# Patient Record
Sex: Female | Born: 1970 | Race: Black or African American | Hispanic: No | Marital: Single | Smoking: Never smoker
Health system: Southern US, Community
[De-identification: ages and names within clinical notes are randomized; demographics above are authoritative.]

## PROBLEM LIST (undated history)

## (undated) DIAGNOSIS — K7460 Unspecified cirrhosis of liver: Secondary | ICD-10-CM

## (undated) DIAGNOSIS — R51 Headache: Secondary | ICD-10-CM

## (undated) DIAGNOSIS — R011 Cardiac murmur, unspecified: Secondary | ICD-10-CM

## (undated) DIAGNOSIS — N186 End stage renal disease: Secondary | ICD-10-CM

## (undated) DIAGNOSIS — G43909 Migraine, unspecified, not intractable, without status migrainosus: Secondary | ICD-10-CM

## (undated) DIAGNOSIS — I272 Pulmonary hypertension, unspecified: Secondary | ICD-10-CM

## (undated) DIAGNOSIS — R188 Other ascites: Secondary | ICD-10-CM

## (undated) DIAGNOSIS — I10 Essential (primary) hypertension: Secondary | ICD-10-CM

## (undated) DIAGNOSIS — Z992 Dependence on renal dialysis: Secondary | ICD-10-CM

## (undated) DIAGNOSIS — Z9289 Personal history of other medical treatment: Secondary | ICD-10-CM

## (undated) DIAGNOSIS — Z95828 Presence of other vascular implants and grafts: Secondary | ICD-10-CM

## (undated) DIAGNOSIS — D638 Anemia in other chronic diseases classified elsewhere: Secondary | ICD-10-CM

## (undated) HISTORY — DX: Pulmonary hypertension, unspecified: I27.20

## (undated) HISTORY — DX: Unspecified cirrhosis of liver: K74.60

## (undated) HISTORY — PX: THORACENTESIS: SHX235

---

## 2010-10-19 HISTORY — PX: KIDNEY TRANSPLANT: SHX239

## 2011-10-20 HISTORY — PX: ARTERIOVENOUS GRAFT PLACEMENT: SUR1029

## 2014-04-14 ENCOUNTER — Non-Acute Institutional Stay (HOSPITAL_COMMUNITY)
Admission: EM | Admit: 2014-04-14 | Discharge: 2014-04-16 | Disposition: A | Discharge status: Home / Self Care | Payer: Medicare Other | Attending: Emergency Medicine | Admitting: Emergency Medicine

## 2014-04-14 ENCOUNTER — Encounter (HOSPITAL_COMMUNITY): Payer: Self-pay | Admitting: Emergency Medicine

## 2014-04-14 ENCOUNTER — Other Ambulatory Visit: Payer: Self-pay

## 2014-04-14 ENCOUNTER — Emergency Department (HOSPITAL_COMMUNITY): Payer: Medicare Other

## 2014-04-14 DIAGNOSIS — E8779 Other fluid overload: Secondary | ICD-10-CM | POA: Insufficient documentation

## 2014-04-14 DIAGNOSIS — Z91158 Patient's noncompliance with renal dialysis for other reason: Secondary | ICD-10-CM | POA: Insufficient documentation

## 2014-04-14 DIAGNOSIS — I120 Hypertensive chronic kidney disease with stage 5 chronic kidney disease or end stage renal disease: Secondary | ICD-10-CM | POA: Insufficient documentation

## 2014-04-14 DIAGNOSIS — Z9115 Patient's noncompliance with renal dialysis: Secondary | ICD-10-CM | POA: Insufficient documentation

## 2014-04-14 DIAGNOSIS — N186 End stage renal disease: Secondary | ICD-10-CM | POA: Insufficient documentation

## 2014-04-14 DIAGNOSIS — E875 Hyperkalemia: Secondary | ICD-10-CM | POA: Insufficient documentation

## 2014-04-14 DIAGNOSIS — Z992 Dependence on renal dialysis: Secondary | ICD-10-CM | POA: Insufficient documentation

## 2014-04-14 HISTORY — DX: Presence of other vascular implants and grafts: Z95.828

## 2014-04-14 HISTORY — DX: Essential (primary) hypertension: I10

## 2014-04-14 LAB — CBC WITH DIFFERENTIAL/PLATELET
Basophils Absolute: 0.1 10*3/uL (ref 0.0–0.1)
Basophils Relative: 1 % (ref 0–1)
Eosinophils Absolute: 0.6 10*3/uL (ref 0.0–0.7)
Eosinophils Relative: 8 % — ABNORMAL HIGH (ref 0–5)
HEMATOCRIT: 35.4 % — AB (ref 36.0–46.0)
Hemoglobin: 11 g/dL — ABNORMAL LOW (ref 12.0–15.0)
LYMPHS PCT: 12 % (ref 12–46)
Lymphs Abs: 0.9 10*3/uL (ref 0.7–4.0)
MCH: 28.8 pg (ref 26.0–34.0)
MCHC: 31.1 g/dL (ref 30.0–36.0)
MCV: 92.7 fL (ref 78.0–100.0)
MONO ABS: 0.5 10*3/uL (ref 0.1–1.0)
Monocytes Relative: 8 % (ref 3–12)
NEUTROS PCT: 71 % (ref 43–77)
Neutro Abs: 5.1 10*3/uL (ref 1.7–7.7)
Platelets: 181 10*3/uL (ref 150–400)
RBC: 3.82 MIL/uL — AB (ref 3.87–5.11)
RDW: 15.4 % (ref 11.5–15.5)
WBC: 7.2 10*3/uL (ref 4.0–10.5)

## 2014-04-14 LAB — HEPATITIS B SURFACE ANTIGEN: HEP B S AG: NEGATIVE

## 2014-04-14 LAB — BASIC METABOLIC PANEL
BUN: 92 mg/dL — AB (ref 6–23)
CO2: 19 meq/L (ref 19–32)
Calcium: 6.9 mg/dL — ABNORMAL LOW (ref 8.4–10.5)
Chloride: 97 mEq/L (ref 96–112)
Creatinine, Ser: 14.57 mg/dL — ABNORMAL HIGH (ref 0.50–1.10)
GFR calc Af Amer: 3 mL/min — ABNORMAL LOW (ref >90)
GFR calc non Af Amer: 3 mL/min — ABNORMAL LOW (ref >90)
Glucose, Bld: 105 mg/dL — ABNORMAL HIGH (ref 70–99)
Potassium: 6.3 mEq/L — ABNORMAL HIGH (ref 3.7–5.3)
Sodium: 137 mEq/L (ref 137–147)

## 2014-04-14 LAB — PHOSPHORUS: PHOSPHORUS: 9.6 mg/dL — AB (ref 2.3–4.6)

## 2014-04-14 NOTE — ED Notes (Signed)
Patient transported to X-ray 

## 2014-04-14 NOTE — Procedures (Signed)
Patient was seen on dialysis and the procedure was supervised.  BFR 400  Via thigh AVG BP is  180/100.   Patient appears to be tolerating treatment well- to be discharged after HD  GOLDSBOROUGH,KELLIE A 04/14/2014

## 2014-04-14 NOTE — Discharge Summary (Signed)
Physician Discharge Summary  Patient ID: Holly Mendoza MRN: [id number] DOB/AGE: Feb 01, 1971 43 y.o.  Admit date: 04/14/2014 Discharge date: 04/14/2014  Admission Diagnoses: hyperkalemia due to noncompliance with HD  Discharge Diagnoses:  Active Problems:   Hyperkalemia   Discharged Condition: fair  Hospital Course: Pt presented after failing to set up OP dialysis associated with a move to the area.  She is noted to be noncompliant with dialysis where she lives and that has been the issue. She is hyperkalemic and volume overloaded today.   Will plan on doing HD today and allow her to go home.  OP dialysis may be difficult but will need to work on this on Monday  Consults: None  Significant Diagnostic Studies: labs: potassium of 6.3  Treatments: dialysis: Hemodialysis  Discharge Exam: Blood pressure 190/98, pulse 96, temperature 97.5 F (36.4 C), temperature source Oral, resp. rate 12, SpO2 100.00%. General appearance: alert and no distress Resp: diminished breath sounds bibasilar Cardio: regular rate and rhythm, S1, S2 normal, no murmur, click, rub or gallop GI: soft, non-tender; bowel sounds normal; no masses,  no organomegaly Extremities: edema 2 plus thigh AVG  Disposition: to home with family     Medication List    ASK your doctor about these medications       calcium acetate 667 MG capsule  Commonly known as:  PHOSLO  Take 1,334 mg by mouth 3 (three) times daily with meals.     cloNIDine 0.3 MG tablet  Commonly known as:  CATAPRES  Take 0.3 mg by mouth 2 (two) times daily.         SignedAnnie Mendoza A 04/14/2014, 3:33 PM

## 2014-04-14 NOTE — ED Provider Notes (Signed)
TIME SEEN: 12:48 PM  CHIEF COMPLAINT: "I'm here for dialysis"  HPI: Patient is a 43 y.o. F with history of end-stage renal disease he has a AV fistula in her left upper extremity who receives hemodialysis Tuesday, Thursday and Saturday, hypertension presents to the emergency department requesting dialysis. Patient states she was last dialyzed on Tuesday, 4 days ago. She states that she has had some increasing swelling of her lower extremities but no chest pain, shortness of breath. She does occasionally have to have paracentesis due to abdominal swelling and had this last on Thursday, 2 days ago. She states [place] here from [place] and has not established care with a nephrologist and does not have a dialysis Center yet. She states this was supposed to be set up for her prior to coming to [place] but was not. She states she is here because she is concerned that she may need dialysis as an inpatient due to her increased swelling. She denies fevers, cough, vomiting or diarrhea. No numbness or focal weakness.  ROS: See HPI Constitutional: no fever  Eyes: no drainage  ENT: no runny nose   Cardiovascular:  no chest pain  Resp: no SOB  GI: no vomiting GU: no dysuria Integumentary: no rash  Allergy: no hives  Musculoskeletal:  leg swelling  Neurological: no slurred speech ROS otherwise negative  PAST MEDICAL HISTORY/PAST SURGICAL HISTORY:  Past Medical History  Diagnosis Date  . Renal disease   . 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 Hypertension     MEDICATIONS:  Prior to Admission medications   Medication Sig Start Date End Date Taking? Authorizing Provider  calcium acetate (PHOSLO) 667 MG capsule Take 1,334 mg by mouth 3 (three) times daily with meals.   Yes Historical Provider, MD  cloNIDine (CATAPRES) 0.3 MG tablet Take 0.3 mg by mouth 2 (two) times daily.   Yes Historical Provider, MD    ALLERGIES:  Allergies  Allergen  Reactions  . Contrast Media [Iodinated Diagnostic Agents] Anaphylaxis  . Vancomycin Anaphylaxis and Hives    SOCIAL HISTORY:  History  Substance Use Topics  . Smoking status: Never Smoker   . Smokeless tobacco: Not on file  . Alcohol Use: No    FAMILY HISTORY: No family history on file.  EXAM: BP 190/98  Pulse 96  Temp(Src) 97.5 F (36.4 C) (Oral)  Resp 12  SpO2 100% CONSTITUTIONAL: Alert and oriented and responds appropriately to questions. Well-appearing; well-nourished, pleasant, no distress HEAD: Normocephalic EYES: Conjunctivae clear, PERRL ENT: normal nose; no rhinorrhea; moist mucous membranes; pharynx without lesions noted NECK: Supple, no meningismus, no LAD  CARD: RRR; S1 and S2 appreciated; no murmurs, no clicks, no rubs, no gallops RESP: Normal chest excursion without splinting or tachypnea; breath sounds clear and equal bilaterally; no wheezes, no rhonchi, no rales, no hypoxia or respiratory distress, no increased work of breathing ABD/GI: Normal bowel sounds; non-distended; soft, non-tender, no rebound, no guarding BACK:  The back appears normal and is non-tender to palpation, there is no CVA tenderness EXT: Normal ROM in all joints; non-tender to palpation; pitting edema to the level of her bilateral knees with no erythema or warmth or induration; normal capillary refill; no cyanosis; fistula in left thigh with thrill and bruit, normal femoral pulses bilaterally, 2+ DP pulses bilaterally, no urothelial or warmth or induration or fluctuance or drainage over her fistula site SKIN: Normal color for age and race; warm NEURO: Moves all  extremities equally sensation to light touch intact diffusely, cranial nerves II through XII intact PSYCH: The patient's mood and manner are appropriate. Grooming and personal hygiene are appropriate.  MEDICAL DECISION MAKING: Patient here requesting dialysis. She states she just moved here from . Last dialyzed 4 days ago. She  missed her dialysis on Thursday and would be due for dialysis again today. She is having some lower extremity swelling she states is slightly worse than normal but no respiratory distress, shortness of breath, chest pain. We'll obtain labs, chest x-ray. She is mildly hypertensive. We'll discuss with nephrology for appropriate followup versus admission.  ED PROGRESS: Patient has an elevated potassium level of 6.3. 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 She is still asymptomatic. Discussed with Dr. Katherina Mires with nephrology. Renal will see the patient in the ED and sent her up for dialysis today. They do not feel she presently needs to be admitted to the hospital at this time as they feel because she is asymptomatic they can dialyze her and then discharge her with outpatient followup. Will keep patient on a monitor given her hyperkalemia.    EKG Interpretation  Date/Time:  Saturday April 14 2014 13:05:19 EDT Ventricular Rate:  82 PR Interval:  142 QRS Duration: 152 QT Interval:  443 QTC Calculation: 517 R Axis:   115 Text Interpretation:  Normal sinus rhythm Right bundle branch block Left anterior fasicular block No previous ECGs available Confirmed by RAY MD, DANIELLE ([id number]) on 04/14/2014 1:09:42 PM        Layla Maw Ward, DO 04/14/14 1452

## 2014-04-14 NOTE — ED Notes (Signed)
Consent for Request for medical records and dialysis orders signed by Patient from [facility] in [place].

## 2014-04-14 NOTE — ED Notes (Signed)
Pt recently moved from [place], [place] and needs dialysis. States she normally gets paperwork to have it set up. Was not able to have anything set up for her prior to her living. Is normally a Tues, Thurs, Saturday dialysis. Denies any pain.

## 2014-04-14 NOTE — H&P (Signed)
Short History and Physical Form [facility]  04/14/2014,3:19 PM  LOS: 0 days   HPI:   Holly Mendoza is an 43 y.o. female with PMhx significant for ESRD- on HD since 1996, malignant HTN and what appears to be cardiomyopathy.  She previously was getting HD a the [facility] in [place] but was noted that she would only run in dialysis for 2 hours.  She says she can only run for 2 hours due to the "naturalyte" dialysate that she does not tolerate dialysis well- she has chest burning and cannot tolerate.  She is moving to this area to be with family.  Attempts were made to secure her a place but her behavior of signing off early q treatment and multiple hosps was a red flag so she was not accepted.  She moved here on Wednesday.  Last HD was Tuesday.  She gets weekly paracentesis of 6-8 liters per her but doesn't eat or drink anything.  She presents to ER here, she is volume overloaded and hypertensive and hyperkalemic.    She has no OP unit for now  Past Medical History  Diagnosis Date  . Renal disease   . 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 Hypertension    Allergies  Allergen Reactions  . Contrast Media [Iodinated Diagnostic Agents] Anaphylaxis  . Vancomycin Anaphylaxis and Hives   Prior to Admission medications   Medication Sig Start Date End Date Taking? Authorizing Provider  calcium acetate (PHOSLO) 667 MG capsule Take 1,334 mg by mouth 3 (three) times daily with meals.   Yes Historical Provider, MD  cloNIDine (CATAPRES) 0.3 MG tablet Take 0.3 mg by mouth 2 (two) times daily.   Yes Historical Provider, MD   Results for orders placed during the hospital encounter of 04/14/14 (from the past 48 hour(s))  CBC WITH DIFFERENTIAL     Status: Abnormal   Collection Time    04/14/14  1:00 PM      Result Value Ref Range   WBC 7.2  4.0 - 10.5 K/uL   RBC 3.82 (*) 3.87 - 5.11 MIL/uL   Hemoglobin 11.0 (*) 12.0 - 15.0 g/dL   HCT 35.4  (*) 36.0 - 46.0 %   MCV 92.7  78.0 - 100.0 fL   MCH 28.8  26.0 - 34.0 pg   MCHC 31.1  30.0 - 36.0 g/dL   RDW 15.4  11.5 - 15.5 %   Platelets 181  150 - 400 K/uL   Neutrophils Relative % 71  43 - 77 %   Neutro Abs 5.1  1.7 - 7.7 K/uL   Lymphocytes Relative 12  12 - 46 %   Lymphs Abs 0.9  0.7 - 4.0 K/uL   Monocytes Relative 8  3 - 12 %   Monocytes Absolute 0.5  0.1 - 1.0 K/uL   Eosinophils Relative 8 (*) 0 - 5 %   Eosinophils Absolute 0.6  0.0 - 0.7 K/uL   Basophils Relative 1  0 - 1 %   Basophils Absolute 0.1  0.0 - 0.1 K/uL  BASIC METABOLIC PANEL     Status: Abnormal   Collection Time    04/14/14  1:00 PM      Result Value Ref Range   Sodium 137  137 - 147 mEq/L   Potassium 6.3 (*) 3.7 - 5.3 mEq/L   Chloride 97  96 - 112 mEq/L   CO2 19  19 - 32 mEq/L  Glucose, Bld 105 (*) 70 - 99 mg/dL   BUN 92 (*) 6 - 23 mg/dL   Creatinine, Ser 14.57 (*) 0.50 - 1.10 mg/dL   Calcium 6.9 (*) 8.4 - 10.5 mg/dL   GFR calc non Af Amer 3 (*) >90 mL/min   GFR calc Af Amer 3 (*) >90 mL/min   Comment: (NOTE)     The eGFR has been calculated using the CKD EPI equation.     This calculation has not been validated in all clinical situations.     eGFR's persistently <90 mL/min signify possible Chronic Kidney     Disease.  PHOSPHORUS     Status: Abnormal   Collection Time    04/14/14  1:00 PM      Result Value Ref Range   Phosphorus 9.6 (*) 2.3 - 4.6 mg/dL   Dg Chest 2 View  04/14/2014   CLINICAL DATA:  Edema  EXAM: CHEST  2 VIEW  COMPARISON:  None.  FINDINGS: Moderate cardiomegaly. Normal vascularity. Small left pleural effusion with basilar atelectasis. Hazy opacity at the right base with elevation of the right hemidiaphragm likely reflects a combination of pleural effusion and airspace disease. Linear atelectasis at the base of the right upper lobe.  IMPRESSION: Cardiomegaly without convincing evidence of pulmonary edema.  Small pleural effusions.  Right basilar hazy airspace disease is suspected.    Electronically Signed   By: Maryclare Bean M.D.   On: 04/14/2014 13:42    Physical Exam: Filed Vitals:   04/14/14 1231  BP:   Pulse: 96  Temp:   Resp: 12   General: soft spoken BF, many excuses about why she is not able to participate appropriately with her dialysis and why she moved without a spot Heart: RRR Lungs: decreased BS at bases Abdomen: obese- does not seem like ascites present Extremities: pitting edema bilaterally Access: left thigh AVG  Assessment/Plan: 43 year old BF long standing dialysis patient who is noncompliant and as a result has a cardiomyopathy and requires supplemental paracentesis for volume 1 Hyperkalemia- HD here in hospital today followed by discharge.   2 ESRD: I have written for 4 hours, we will see how she tolerates here- she is uremic today so is in need of a good treatment 3. Volume- is overloaded- goal of 4-5 liters 4. Dispo- pt will be discharged after HD- unfortunately she will be a tough placement but now that she is here, we will just need to figure out the unit closest to her - it seems it would be [facility]  04/14/2014, 3:19 PM

## 2014-04-14 NOTE — ED Notes (Signed)
MD at bedside. 

## 2014-04-14 NOTE — Progress Notes (Signed)
Pt tolerated tx well goal met, pt d/c'd home with family via wheelchair. No c/o voiced pt stable at time of discharge

## 2014-04-18 ENCOUNTER — Emergency Department (HOSPITAL_COMMUNITY)
Admission: EM | Admit: 2014-04-18 | Discharge: 2014-04-19 | Disposition: A | Discharge status: Home / Self Care | Payer: Medicare Other | Attending: Emergency Medicine | Admitting: Emergency Medicine

## 2014-04-18 ENCOUNTER — Encounter (HOSPITAL_COMMUNITY): Payer: Self-pay | Admitting: Emergency Medicine

## 2014-04-18 ENCOUNTER — Emergency Department (HOSPITAL_COMMUNITY): Payer: Medicare Other

## 2014-04-18 DIAGNOSIS — M7989 Other specified soft tissue disorders: Secondary | ICD-10-CM | POA: Insufficient documentation

## 2014-04-18 DIAGNOSIS — E875 Hyperkalemia: Secondary | ICD-10-CM | POA: Insufficient documentation

## 2014-04-18 DIAGNOSIS — N186 End stage renal disease: Secondary | ICD-10-CM | POA: Insufficient documentation

## 2014-04-18 DIAGNOSIS — I10 Essential (primary) hypertension: Secondary | ICD-10-CM | POA: Insufficient documentation

## 2014-04-18 LAB — COMPREHENSIVE METABOLIC PANEL
ALK PHOS: 163 U/L — AB (ref 39–117)
ALT: 18 U/L (ref 0–35)
ANION GAP: 21 — AB (ref 5–15)
AST: 20 U/L (ref 0–37)
Albumin: 2.2 g/dL — ABNORMAL LOW (ref 3.5–5.2)
BILIRUBIN TOTAL: 0.2 mg/dL — AB (ref 0.3–1.2)
BUN: 78 mg/dL — ABNORMAL HIGH (ref 6–23)
CHLORIDE: 95 meq/L — AB (ref 96–112)
CO2: 22 mEq/L (ref 19–32)
Calcium: 6.9 mg/dL — ABNORMAL LOW (ref 8.4–10.5)
Creatinine, Ser: 13.68 mg/dL — ABNORMAL HIGH (ref 0.50–1.10)
GFR calc non Af Amer: 3 mL/min — ABNORMAL LOW (ref >90)
GFR, EST AFRICAN AMERICAN: 3 mL/min — AB (ref >90)
GLUCOSE: 83 mg/dL (ref 70–99)
POTASSIUM: 6.4 meq/L — AB (ref 3.7–5.3)
Sodium: 138 mEq/L (ref 137–147)
Total Protein: 6.3 g/dL (ref 6.0–8.3)

## 2014-04-18 LAB — CBC WITH DIFFERENTIAL/PLATELET
Basophils Absolute: 0 10*3/uL (ref 0.0–0.1)
Basophils Relative: 0 % (ref 0–1)
Eosinophils Absolute: 0.4 10*3/uL (ref 0.0–0.7)
Eosinophils Relative: 6 % — ABNORMAL HIGH (ref 0–5)
HCT: 33.3 % — ABNORMAL LOW (ref 36.0–46.0)
HEMOGLOBIN: 10.7 g/dL — AB (ref 12.0–15.0)
LYMPHS ABS: 1.2 10*3/uL (ref 0.7–4.0)
LYMPHS PCT: 17 % (ref 12–46)
MCH: 28.8 pg (ref 26.0–34.0)
MCHC: 32.1 g/dL (ref 30.0–36.0)
MCV: 89.5 fL (ref 78.0–100.0)
MONOS PCT: 8 % (ref 3–12)
Monocytes Absolute: 0.6 10*3/uL (ref 0.1–1.0)
NEUTROS ABS: 5.2 10*3/uL (ref 1.7–7.7)
NEUTROS PCT: 69 % (ref 43–77)
Platelets: 197 10*3/uL (ref 150–400)
RBC: 3.72 MIL/uL — AB (ref 3.87–5.11)
RDW: 14.6 % (ref 11.5–15.5)
WBC: 7.4 10*3/uL (ref 4.0–10.5)

## 2014-04-18 NOTE — Progress Notes (Signed)
04/18/14 1945 W. Stann Mainland RN BSN NCM [phone] Pt presented to ED for HD as per patient. ED case manager review chart and met with patient in HD, patient provided permission to discuss care, verified information.  Patient is a ESRD on HD, and reports recently relocating to the area from [place]. She was seen in the ER 4 days ago with a similar presentation and had dialysis, was then discharged. She was not established with a accepting transfer HD center in [place]. Pt states, that she was not aware that she needed to be accepted by a center on [place].  Provided patient with a list of HD centers in the [facility] area, and the number for [facility] [phone] for assistance. Pt verbalized understanding and appreciation for the assistance.  No further ED CM needs identified     ED CM  Consulted by Dr. Betsey Holiday concerning HD needs, went to [facility]  to meet with patient patient en route to HD. Will f/u with patient.

## 2014-04-18 NOTE — ED Notes (Signed)
To [facility] ED for routene HD.  States that she does not have a center.

## 2014-04-18 NOTE — Procedures (Signed)
I have seen and examined this patient and agree with the plan of care . Patient seen on HD with no acute issues  Treatment for vol XS and hyperkalemia  Holly Mendoza 04/18/2014, 6:35 PM

## 2014-04-18 NOTE — ED Notes (Signed)
Pt here for routine dialysis; pt sts here Sunday for same and is relocating here and does not have a center due to non compliance

## 2014-04-18 NOTE — Progress Notes (Signed)
Pt signed off tx ama sheet signed per patient, D/C'd home with family

## 2014-04-18 NOTE — Consult Note (Signed)
Referring Provider: No ref. provider found Primary Care Physician:  No PCP Per Patient Primary Nephrologist:  none  Reason for Consultation:  Holly Mendoza is an 43 y.o. female with PMhx significant for ESRD- on HD since 1996, malignant HTN and what appears to be cardiomyopathy. She previously was getting HD a the [facility] in [place] but was noted that she would only run in dialysis for 2 hours. She says she can only run for 2 hours due to the "naturalyte" dialysate that she does not tolerate dialysis well- she has chest burning and cannot tolerate. She is moving to this area to be with family. Attempts were made to secure her a place but her behavior of signing off early q treatment and multiple hosps was a red flag so she was not accepted. She moved here on Wednesday. Last HD was Tuesday. She gets weekly paracentesis of 6-8 liters per her but doesn't eat or drink anything.   HPI:  History of failed transplant. She is hyperkalemic and volume overload. The history of naturalyte allergy is a curiosity and do not believe that there are alternatives. The redness on her legs appear more chronic stasis changes.  Past Medical History  Diagnosis Date  . Renal disease   . 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 Hypertension     Past Surgical History  Procedure Laterality Date  . Arteriovenous graft placement Left     thigh  . 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 Authorizing Provider  calcium acetate (PHOSLO) 667 MG capsule Take 1,334 mg by mouth 3 (three) times daily with meals.   Yes Historical Provider, MD  cloNIDine (CATAPRES) 0.3 MG tablet Take 0.3 mg by mouth 2 (two) times daily.   Yes Historical Provider, MD    No current facility-administered medications for this encounter.   Current Outpatient Prescriptions   Medication Sig Dispense Refill  . calcium acetate (PHOSLO) 667 MG capsule Take 1,334 mg by mouth 3 (three) times daily with meals.      . cloNIDine (CATAPRES) 0.3 MG tablet Take 0.3 mg by mouth 2 (two) times daily.        Allergies as of 04/18/2014 - Review Complete 04/18/2014  Allergen Reaction Noted  . Contrast media [iodinated diagnostic agents] Anaphylaxis 04/14/2014  . Vancomycin Anaphylaxis and Hives 04/14/2014    History reviewed. No pertinent family history.  History   Social History  . Marital Status: Single    Spouse Name: N/A    Number of Children: N/A  . Years of Education: N/A   Occupational History  . Not on file.   Social History Main Topics  . Smoking status: Never Smoker   . Smokeless tobacco: Not on file  . Alcohol Use: No  . Drug Use: No  . Sexual Activity: Not on file   Other Topics Concern  . Not on file   Social History Narrative  . No narrative on file    Review of Systems: Gen: Denies any fever, chills, sweats, anorexia, fatigue, weakness, malaise, weight loss, and sleep disorder HEENT: No visual complaints, No history of Retinopathy. Normal external appearance No Epistaxis or Sore throat. No sinusitis.   CV: Denies chest pain, angina, palpitations, syncope, orthopnea, PND, peripheral edema, and claudication. Resp: no respiratory distress  GI: Denies vomiting  blood, jaundice, and fecal incontinence.   Denies dysphagia or odynophagia. GU : Denies urinary burning, blood in urine, urinary frequency, urinary hesitancy, nocturnal urination, and urinary incontinence.  No renal calculi. MS: Denies joint pain, limitation of movement, and swelling, stiffness, low back pain, extremity pain. Denies muscle weakness, cramps, atrophy.  No use of non steroidal antiinflammatory drugs. Derm: redness in legs  Psych: Denies depression, anxiety, memory loss, suicidal ideation, hallucinations, paranoia, and confusion. Heme: Denies bruising, bleeding, and enlarged  lymph nodes. Neuro: No headache.  No diplopia. No dysarthria.  No dysphasia.  No history of CVA.  No Seizures. No paresthesias.  No weakness. Endocrine No DM.  No Thyroid disease.  No Adrenal disease.  Physical Exam: Vital signs in last 24 hours: Temp:  [98 F (36.7 C)-98.1 F (36.7 C)] 98.1 F (36.7 C) (07/01 1732) Pulse Rate:  [96-112] 104 (07/01 1754) Resp:  [15-18] 18 (07/01 1754) BP: (183-197)/(100-116) 197/116 mmHg (07/01 1754) SpO2:  [97 %-98 %] 97 % (07/01 1732) Weight:  [90.6 kg (199 lb 11.8 oz)] 90.6 kg (199 lb 11.8 oz) (07/01 1732)   General: soft spoken BF, many excuses about why she is not able to participate appropriately with her dialysis and why she moved without a spot  Heart: RRR  Lungs: decreased BS at bases  Abdomen: obese- does not seem like ascites present  Extremities: pitting edema bilaterally  Access: left thigh AVG  Psych:  Alert and cooperative. Normal mood and affect.  Intake/Output from previous day:   Intake/Output this shift:    Lab Results:  Recent Labs  04/18/14 1415  WBC 7.4  HGB 10.7*  HCT 33.3*  PLT 197   BMET  Recent Labs  04/18/14 1415  NA 138  K 6.4*  CL 95*  CO2 22  GLUCOSE 83  BUN 78*  CREATININE 13.68*  CALCIUM 6.9*   LFT  Recent Labs  04/18/14 1415  PROT 6.3  ALBUMIN 2.2*  AST 20  ALT 18  ALKPHOS 163*  BILITOT 0.2*   PT/INR No results found for this basename: LABPROT, INR,  in the last 72 hours Hepatitis Panel No results found for this basename: HEPBSAG, HCVAB, HEPAIGM, HEPBIGM,  in the last 72 hours  Studies/Results: Dg Chest Port 1 View  04/18/2014   CLINICAL DATA:  Shortness of breath.  EXAM: PORTABLE CHEST - 1 VIEW  COMPARISON:  04/14/2014  FINDINGS: Cardiac silhouette is enlarged. No mediastinal or hilar masses. Reticular opacity lies in the right upper lobe inferiorly and laterally adjacent to the minor fissure, stable. There is hazy right lung base opacity accentuated by positioning. This may  reflect a small effusion with atelectasis. Is similar to the prior exam. There is vascular prominence without overt pulmonary edema. No lung consolidation is seen. There is no pneumothorax. Bony thorax is demineralized but grossly intact.  IMPRESSION: No convincing acute cardiopulmonary disease. There is cardiomegaly and vascular prominent, but no overt edema. Probable small right effusion with associated atelectasis.   Electronically Signed   By: Amie Portlandavid  Ormond M.D.   On: 04/18/2014 17:11    Assessment/Plan: 43 year old BF long standing dialysis patient who is noncompliant and as a result has a cardiomyopathy and requires supplemental paracentesis for volume  1 Hyperkalemia- HD here in hospital today followed by discharge.  2 ESRD: 4hrs therapy 3. Volume- is overloaded- goal of 4-5 liters  4. Dispo- pt will be discharged after HD   Social worker to evaluate. Needs out-patient slot. Have patient return  On schedule     LOS: 0 Matalyn Nawaz W @TODAY @6 :26 PM

## 2014-04-18 NOTE — ED Provider Notes (Signed)
CSN: [id number]     Arrival date & time 04/18/14  1401 History   First MD Initiated Contact with Patient 04/18/14 1644     Chief Complaint  Patient presents with  . Vascular Access Problem     (Consider location/radiation/quality/duration/timing/severity/associated sxs/prior Treatment) HPI Comments: Patient presents to the ER stating that she needs dialysis. Patient has recently moved to the area from [place]. She was seen in the ER 4 days ago with a similar presentation and had dialysis, was then discharged. She has not heard about any followup for ongoing dialysis sessions, comes to the ER because she thinks she will need dialysis. She is feeling slightly short of breath. She has had some slight swelling of her legs. There is no chest pain. Patient hypertensive arrival, no headache or blurred vision.   Past Medical History  Diagnosis Date  . Renal disease   . 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 Hypertension    Past Surgical History  Procedure Laterality Date  . Arteriovenous graft placement Left     thigh  . 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 No pertinent family history. History  Substance Use Topics  . Smoking status: Never Smoker   . Smokeless tobacco: Not on file  . Alcohol Use: No   OB History   Grav Para Term Preterm Abortions TAB SAB Ect Mult Living                 Review of Systems  Respiratory: Positive for shortness of breath.   Cardiovascular: Positive for leg swelling. Negative for chest pain.  All other systems reviewed and are negative.     Allergies  Contrast media and Vancomycin  Home Medications   Prior to Admission medications   Medication Sig Start Date End Date Taking? Authorizing Provider  calcium acetate (PHOSLO) 667 MG capsule Take 1,334 mg by mouth 3 (three) times daily with meals.   Yes Historical Provider, MD  cloNIDine  (CATAPRES) 0.3 MG tablet Take 0.3 mg by mouth 2 (two) times daily.   Yes Historical Provider, MD   BP 191/100  Pulse 100  Temp(Src) 98 F (36.7 C) (Oral)  Resp 15  SpO2 98% Physical Exam  Constitutional: She is oriented to person, place, and time. She appears well-developed and well-nourished. No distress.  HENT:  Head: Normocephalic and atraumatic.  Right Ear: Hearing normal.  Left Ear: Hearing normal.  Nose: Nose normal.  Mouth/Throat: Oropharynx is clear and moist and mucous membranes are normal.  Eyes: Conjunctivae and EOM are normal. Pupils are equal, round, and reactive to light.  Neck: Normal range of motion. Neck supple.  Cardiovascular: Regular rhythm, S1 normal and S2 normal.  Exam reveals no gallop and no friction rub.   No murmur heard. Pulmonary/Chest: Effort normal and breath sounds normal. No respiratory distress. She exhibits no tenderness.  Abdominal: Soft. Normal appearance and bowel sounds are normal. There is no hepatosplenomegaly. There is no tenderness. There is no rebound, no guarding, no tenderness at McBurney's point and negative Murphy's sign. No hernia.  Musculoskeletal: Normal range of motion.  Neurological: She is alert and oriented to person, place, and time. She has normal strength. No cranial nerve deficit or sensory deficit. Coordination normal. GCS eye subscore is 4. GCS verbal subscore is 5. GCS motor subscore is 6.  Skin: Skin is warm, dry and intact. No  rash noted. No cyanosis.  Psychiatric: She has a normal mood and affect. Her speech is normal and behavior is normal. Thought content normal.    ED Course  Procedures (including critical care time) Labs Review Labs Reviewed  CBC WITH DIFFERENTIAL - Abnormal; Notable for the following:    RBC 3.72 (*)    Hemoglobin 10.7 (*)    HCT 33.3 (*)    Eosinophils Relative 6 (*)    All other components within normal limits  COMPREHENSIVE METABOLIC PANEL - Abnormal; Notable for the following:     Potassium 6.4 (*)    Chloride 95 (*)    BUN 78 (*)    Creatinine, Ser 13.68 (*)    Calcium 6.9 (*)    Albumin 2.2 (*)    Alkaline Phosphatase 163 (*)    Total Bilirubin 0.2 (*)    GFR calc non Af Amer 3 (*)    GFR calc Af Amer 3 (*)    Anion gap 21 (*)    All other components within normal limits    Imaging Review No results found.   EKG Interpretation None      MDM   Final diagnoses:  None   hyperkalemia End-stage renal disease  Patient presents to the ER with shortnes of breath, no dialysis for 4 days. She has hyperkalemia. She likely has some mild volume overload. Her range is made for her to get emergent dialysis. Case manager consult to help with long-term followup for dialysis.    Gilda Creasehristopher J. Trusten Hume, MD 04/19/14 [phone]

## 2014-04-18 NOTE — ED Notes (Signed)
Transported to HD by RN

## 2014-04-20 ENCOUNTER — Encounter (HOSPITAL_COMMUNITY): Payer: Self-pay | Admitting: Emergency Medicine

## 2014-04-20 ENCOUNTER — Emergency Department (HOSPITAL_COMMUNITY)
Admission: EM | Admit: 2014-04-20 | Discharge: 2014-04-20 | Disposition: A | Discharge status: Home / Self Care | Payer: Medicare Other | Attending: Emergency Medicine | Admitting: Emergency Medicine

## 2014-04-20 DIAGNOSIS — Y831 Surgical operation with implant of artificial internal device as the cause of abnormal reaction of the patient, or of later complication, without mention of misadventure at the time of the procedure: Secondary | ICD-10-CM | POA: Insufficient documentation

## 2014-04-20 DIAGNOSIS — Z79899 Other long term (current) drug therapy: Secondary | ICD-10-CM | POA: Insufficient documentation

## 2014-04-20 DIAGNOSIS — K429 Umbilical hernia without obstruction or gangrene: Secondary | ICD-10-CM | POA: Insufficient documentation

## 2014-04-20 DIAGNOSIS — N186 End stage renal disease: Secondary | ICD-10-CM

## 2014-04-20 DIAGNOSIS — Z940 Kidney transplant status: Secondary | ICD-10-CM | POA: Insufficient documentation

## 2014-04-20 DIAGNOSIS — Z992 Dependence on renal dialysis: Secondary | ICD-10-CM | POA: Insufficient documentation

## 2014-04-20 DIAGNOSIS — R188 Other ascites: Secondary | ICD-10-CM | POA: Insufficient documentation

## 2014-04-20 DIAGNOSIS — I120 Hypertensive chronic kidney disease with stage 5 chronic kidney disease or end stage renal disease: Secondary | ICD-10-CM | POA: Insufficient documentation

## 2014-04-20 DIAGNOSIS — E875 Hyperkalemia: Secondary | ICD-10-CM | POA: Insufficient documentation

## 2014-04-20 DIAGNOSIS — Z87898 Personal history of other specified conditions: Secondary | ICD-10-CM

## 2014-04-20 HISTORY — DX: Dependence on renal dialysis: Z99.2

## 2014-04-20 HISTORY — DX: End stage renal disease: N18.6

## 2014-04-20 LAB — CBC WITH DIFFERENTIAL/PLATELET
Basophils Absolute: 0 10*3/uL (ref 0.0–0.1)
Basophils Relative: 0 % (ref 0–1)
Eosinophils Absolute: 0.5 10*3/uL (ref 0.0–0.7)
Eosinophils Relative: 7 % — ABNORMAL HIGH (ref 0–5)
HCT: 32.8 % — ABNORMAL LOW (ref 36.0–46.0)
Hemoglobin: 10 g/dL — ABNORMAL LOW (ref 12.0–15.0)
Lymphocytes Relative: 14 % (ref 12–46)
Lymphs Abs: 1 10*3/uL (ref 0.7–4.0)
MCH: 28 pg (ref 26.0–34.0)
MCHC: 30.5 g/dL (ref 30.0–36.0)
MCV: 91.9 fL (ref 78.0–100.0)
Monocytes Absolute: 0.5 10*3/uL (ref 0.1–1.0)
Monocytes Relative: 8 % (ref 3–12)
Neutro Abs: 5 10*3/uL (ref 1.7–7.7)
Neutrophils Relative %: 71 % (ref 43–77)
Platelets: 169 10*3/uL (ref 150–400)
RBC: 3.57 MIL/uL — ABNORMAL LOW (ref 3.87–5.11)
RDW: 14.5 % (ref 11.5–15.5)
WBC: 7 10*3/uL (ref 4.0–10.5)

## 2014-04-20 LAB — BASIC METABOLIC PANEL
Anion gap: 17 — ABNORMAL HIGH (ref 5–15)
BUN: 62 mg/dL — ABNORMAL HIGH (ref 6–23)
CO2: 24 mEq/L (ref 19–32)
Calcium: 7 mg/dL — ABNORMAL LOW (ref 8.4–10.5)
Chloride: 101 mEq/L (ref 96–112)
Creatinine, Ser: 11.47 mg/dL — ABNORMAL HIGH (ref 0.50–1.10)
GFR calc Af Amer: 4 mL/min — ABNORMAL LOW (ref >90)
GFR calc non Af Amer: 4 mL/min — ABNORMAL LOW (ref >90)
Glucose, Bld: 83 mg/dL (ref 70–99)
Potassium: 5.6 mEq/L — ABNORMAL HIGH (ref 3.7–5.3)
Sodium: 142 mEq/L (ref 137–147)

## 2014-04-20 LAB — MAGNESIUM: Magnesium: 1.9 mg/dL (ref 1.5–2.5)

## 2014-04-20 LAB — PHOSPHORUS: Phosphorus: 8.5 mg/dL — ABNORMAL HIGH (ref 2.3–4.6)

## 2014-04-20 MED ORDER — HEPARIN SODIUM (PORCINE) 1000 UNIT/ML DIALYSIS
1000.0000 [IU] | INTRAMUSCULAR | Status: DC | PRN
  Filled 2014-04-20: qty 1

## 2014-04-20 MED ORDER — CLONIDINE HCL 0.2 MG PO TABS
0.3000 mg | ORAL_TABLET | Freq: Two times a day (BID) | ORAL | Status: DC
  Administered 2014-04-20: 0.3 mg via ORAL
  Filled 2014-04-20 (×2): qty 1

## 2014-04-20 MED ORDER — NEPRO/CARBSTEADY PO LIQD
237.0000 mL | ORAL | Status: DC | PRN
  Filled 2014-04-20: qty 237

## 2014-04-20 MED ORDER — ALTEPLASE 2 MG IJ SOLR: 2.0000 mg | Freq: Once | INTRAMUSCULAR | Status: DC | PRN

## 2014-04-20 MED ORDER — PENTAFLUOROPROP-TETRAFLUOROETH EX AERO: 1.0000 "application " | INHALATION_SPRAY | CUTANEOUS | Status: DC | PRN

## 2014-04-20 MED ORDER — SODIUM CHLORIDE 0.9 % IV SOLN: 100.0000 mL | INTRAVENOUS | Status: DC | PRN

## 2014-04-20 MED ORDER — LIDOCAINE-PRILOCAINE 2.5-2.5 % EX CREA: 1.0000 "application " | TOPICAL_CREAM | CUTANEOUS | Status: DC | PRN

## 2014-04-20 MED ORDER — HEPARIN SODIUM (PORCINE) 1000 UNIT/ML DIALYSIS
2000.0000 [IU] | INTRAMUSCULAR | Status: DC | PRN
  Filled 2014-04-20: qty 2

## 2014-04-20 MED ORDER — LIDOCAINE HCL (PF) 1 % IJ SOLN: 5.0000 mL | INTRAMUSCULAR | Status: DC | PRN

## 2014-04-20 NOTE — ED Notes (Signed)
Spoke to dialysis. They advise should not be much longer

## 2014-04-20 NOTE — ED Provider Notes (Signed)
CSN: [id number]     Arrival date & time 04/20/14  1008 History   First MD Initiated Contact with Patient 04/20/14 1024     Chief Complaint  Patient presents with  . Vascular Access Problem     (Consider location/radiation/quality/duration/timing/severity/associated sxs/prior Treatment) HPI  43yF ESRD dialysis patient presenting for dialysis. Hx of noncompliance and as a result has a cardiomyopathy requiring supplemental paracentesis for volume. Recently moved from [place] and ongoing efforts to get her established with dialysis center. Last dialysis Wednesday. She is also requesting paracentesis. Reports that had been getting them on weekly basis prior to moving. Abdomen is increasingly distended. No SOB.   Past Medical History  Diagnosis Date  . Renal disease   . 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 Hypertension    Past Surgical History  Procedure Laterality Date  . Arteriovenous graft placement Left     thigh  . 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 History  Substance Use Topics  . Smoking status: Never Smoker   . Smokeless tobacco: Not on file  . Alcohol Use: No   OB History   Grav Para Term Preterm Abortions TAB SAB Ect Mult Living                 Review of Systems  All systems reviewed and negative, other than as noted in HPI.   Allergies  Contrast media and Vancomycin  Home Medications   Prior to Admission medications   Medication Sig Start Date End Date Taking? Authorizing Provider  calcium acetate (PHOSLO) 667 MG capsule Take 1,334 mg by mouth 3 (three) times daily with meals.   Yes Historical Provider, MD  cloNIDine (CATAPRES) 0.3 MG tablet Take 0.3 mg by mouth 2 (two) times daily.   Yes Historical Provider, MD   BP 179/94  Pulse 92  Temp(Src) 98.2 F (36.8 C) (Oral)  Resp 20  SpO2 98% Physical Exam  Nursing note and vitals  reviewed. Constitutional: She appears well-developed and well-nourished. No distress.  HENT:  Head: Normocephalic and atraumatic.  Eyes: Conjunctivae are normal. Right eye exhibits no discharge. Left eye exhibits no discharge.  Neck: Neck supple.  Cardiovascular: Normal rate, regular rhythm and normal heart sounds.  Exam reveals no gallop and no friction rub.   No murmur heard. Fistula L groin  Pulmonary/Chest: Effort normal and breath sounds normal. No respiratory distress.  Abdominal: Soft. She exhibits distension. There is no tenderness.  Reducible umbilical hernia  Musculoskeletal: She exhibits no edema and no tenderness.  Neurological: She is alert.  Skin: Skin is warm and dry. She is not diaphoretic.  Psychiatric: She has a normal mood and affect. Her behavior is normal. Thought content normal.    ED Course  Procedures (including critical care time) Labs Review Labs Reviewed  BASIC METABOLIC PANEL - Abnormal; Notable for the following:    Potassium 5.6 (*)    BUN 62 (*)    Creatinine, Ser 11.47 (*)    Calcium 7.0 (*)    GFR calc non Af Amer 4 (*)    GFR calc Af Amer 4 (*)    Anion gap 17 (*)    All other components within normal limits  CBC WITH DIFFERENTIAL - Abnormal; Notable for the following:    RBC 3.57 (*)    Hemoglobin 10.0 (*)    HCT  32.8 (*)    Eosinophils Relative 7 (*)    All other components within normal limits    Imaging Review Dg Chest Port 1 View  04/18/2014   CLINICAL DATA:  Shortness of breath.  EXAM: PORTABLE CHEST - 1 VIEW  COMPARISON:  04/14/2014  FINDINGS: Cardiac silhouette is enlarged. No mediastinal or hilar masses. Reticular opacity lies in the right upper lobe inferiorly and laterally adjacent to the minor fissure, stable. There is hazy right lung base opacity accentuated by positioning. This may reflect a small effusion with atelectasis. Is similar to the prior exam. There is vascular prominence without overt pulmonary edema. No lung  consolidation is seen. There is no pneumothorax. Bony thorax is demineralized but grossly intact.  IMPRESSION: No convincing acute cardiopulmonary disease. There is cardiomegaly and vascular prominent, but no overt edema. Probable small right effusion with associated atelectasis.   Electronically Signed   By: Amie Portland M.D.   On: 04/18/2014 17:11     EKG Interpretation None      EKG:  Rhythm: sinus tachycardia Rate: 100 PR: 139 ms QRS: 119 QTc: 519 RBBB ST segments: NS ST changes No acute change   MDM   Final diagnoses:  ESRD on hemodialysis  Hyperkalemia  Ascites    43yF ESRD w/o established outpt dialysis and cardiomyopathy with worsening abdominal distension. Pt to be dialyzed. Unfortunately will likely be too late to obtain paracentesis by IR by the time it is done. [place] is distended, but soft and NT. Will try to arrange this as an outpt.     Raeford Razor, MD 04/20/14 1535

## 2014-04-20 NOTE — H&P (Signed)
Renal Service History & Physical [facility]  Holly Mendoza is an 43 y.o. female.  Chief Complaint: Need dialysis HPI: 43 yo female with ESRD on HD since 1996, HTN, possible cardiomyopathy presenting to ED today asking for dialysis. Patient left her home and HD unit in [place] and is living here now with family. According to Dr Jon Gills note, "attempt were made" to secure her a place at one of the [place] HD units but patient was not accepted due to hx of behavioral issues and noncompliance.    Since arriving in [place] she has had HD here at [facility] in the inpatient unit twice on 6/27 and on 7/1.  4kg removed on 6/27 and 2.4 kg on 7/1 due to nausea and vomiting w HD. BP's have been high. Recorded home meds are phoslo and clonidine.   She also apparently gets regular paracentesis for recurrent ascites. She denies any sob, orthopnea, cough, n/v/d, jt pains, HA or confusion.  She lives with family.    Past Medical History  Past Medical History  Diagnosis Date  . Renal disease   . 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 Hypertension    Past Surgical History  Past Surgical History  Procedure Laterality Date  . Arteriovenous graft placement Left     thigh  . 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 Social History  reports that she has never smoked. She does not have any smokeless tobacco history on file. She reports that she does not drink alcohol or use illicit drugs. Allergies  Allergies  Allergen Reactions  . Contrast Media [Iodinated Diagnostic Agents] Anaphylaxis  . Vancomycin Anaphylaxis and Hives   Home medications Prior to Admission medications   Medication Sig Start Date End Date Taking? Authorizing Provider  calcium acetate (PHOSLO) 667 MG capsule Take 1,334 mg by mouth 3 (three) times daily with meals.   Yes Historical Provider, MD  cloNIDine  (CATAPRES) 0.3 MG tablet Take 0.3 mg by mouth 2 (two) times daily.   Yes Historical Provider, MD   Liver Function Tests  Recent Labs Lab 04/18/14 1415  AST 20  ALT 18  ALKPHOS 163*  BILITOT 0.2*  PROT 6.3  ALBUMIN 2.2*   No results found for this basename: LIPASE, AMYLASE,  in the last 168 hours CBC  Recent Labs Lab 04/14/14 1300 04/18/14 1415 04/20/14 1045  WBC 7.2 7.4 7.0  NEUTROABS 5.1 5.2 5.0  HGB 11.0* 10.7* 10.0*  HCT 35.4* 33.3* 32.8*  MCV 92.7 89.5 91.9  PLT 181 197 169   Basic Metabolic Panel  Recent Labs Lab 04/14/14 1300 04/18/14 1415 04/20/14 1045  NA 137 138 142  K 6.3* 6.4* 5.6*  CL 97 95* 101  CO2 19 22 24   GLUCOSE 105* 83 83  BUN 92* 78* 62*  CREATININE 14.57* 13.68* 11.47*  CALCIUM 6.9* 6.9* 7.0*  PHOS 9.6*  --  8.5*    Filed Vitals:   04/20/14 1130 04/20/14 1200 04/20/14 1230 04/20/14 1300  BP: 179/94 174/97 179/96 177/94  Pulse: 92 94 96 91  Temp:      TempSrc:      Resp: 20 22 23 24   SpO2: 98% 100% 99% 96%   Exam: Alert chronically ill appearing AAF no distress, calm No rash, cyanosis or gangrene Sclera anicteric, throat clear No jvd or bruits Chest bibasilar  crackles, no wheezing RRR 2/6 SEM no RG Abd +ascites moderate, mild diffuse tenderness 2+ pitting bilat LE pretib edema with brawny skin changes No ulcer or gangrene Neuro is alert, Ox 3 and nf Left thigh AVG is patent    HD: Moved here from [place] ([facility]), we do not have HD orders yet, she has been getting 4h standard bath w 2K here. No heparin.   Assessment: 1 ESRD on hemodialysis- pt is transient and does not have outpatient HD arranged as of yet.  2 Ascites, chronic- gets paracentesis regularly 3 Hx of "heart problems"- no details on this, no echo in chart 4 HTN on clonidine, vol removal 5 Volume excess- no resp compromise  Plan- HD today then d/c home.  Patient says she is working with someone to find a local HD unit.   Vinson Moselleob Isauro Skelley MD (pgr)  [phone]    [phone] 04/20/2014, 1:24 PM

## 2014-04-20 NOTE — ED Notes (Signed)
Spoke to dialysis and they advise they arent sure when they will have a spot available for pt. They expect it will be after 3 pm

## 2014-04-20 NOTE — ED Notes (Signed)
Patient is from [place] but is stationed here at present time and gets dialysis from our center. Pt denies pain at present time notes baseline SOB- no new complaints. Pt gets paracentesis weekly- last one was 2 weeks ago and removed 6L. Pt recieves hemodialysis M/W/F. Pt has patent graft in left thigh- thrill palpated. Pt last received hemodialysis on Wednesday. Pt in NAD

## 2014-04-20 NOTE — ED Notes (Signed)
Pt. Stated, here for dialysis 

## 2014-04-24 ENCOUNTER — Emergency Department (HOSPITAL_COMMUNITY): Payer: Medicare Other

## 2014-04-24 ENCOUNTER — Encounter (HOSPITAL_COMMUNITY): Payer: Self-pay | Admitting: Emergency Medicine

## 2014-04-24 ENCOUNTER — Inpatient Hospital Stay (HOSPITAL_COMMUNITY)
Admission: EM | Admit: 2014-04-24 | Discharge: 2014-04-27 | DRG: 640 | Disposition: A | Discharge status: Home / Self Care | Payer: Medicare Other | Attending: Internal Medicine | Admitting: Internal Medicine

## 2014-04-24 DIAGNOSIS — E8779 Other fluid overload: Principal | ICD-10-CM | POA: Diagnosis present

## 2014-04-24 DIAGNOSIS — I428 Other cardiomyopathies: Secondary | ICD-10-CM | POA: Diagnosis present

## 2014-04-24 DIAGNOSIS — K429 Umbilical hernia without obstruction or gangrene: Secondary | ICD-10-CM | POA: Diagnosis present

## 2014-04-24 DIAGNOSIS — K766 Portal hypertension: Secondary | ICD-10-CM | POA: Diagnosis present

## 2014-04-24 DIAGNOSIS — Z992 Dependence on renal dialysis: Secondary | ICD-10-CM

## 2014-04-24 DIAGNOSIS — I10 Essential (primary) hypertension: Secondary | ICD-10-CM | POA: Diagnosis present

## 2014-04-24 DIAGNOSIS — Z881 Allergy status to other antibiotic agents status: Secondary | ICD-10-CM

## 2014-04-24 DIAGNOSIS — Z87898 Personal history of other specified conditions: Secondary | ICD-10-CM | POA: Diagnosis present

## 2014-04-24 DIAGNOSIS — K219 Gastro-esophageal reflux disease without esophagitis: Secondary | ICD-10-CM | POA: Diagnosis present

## 2014-04-24 DIAGNOSIS — Z91199 Patient's noncompliance with other medical treatment and regimen due to unspecified reason: Secondary | ICD-10-CM

## 2014-04-24 DIAGNOSIS — Z9119 Patient's noncompliance with other medical treatment and regimen: Secondary | ICD-10-CM

## 2014-04-24 DIAGNOSIS — E875 Hyperkalemia: Secondary | ICD-10-CM | POA: Diagnosis present

## 2014-04-24 DIAGNOSIS — K7460 Unspecified cirrhosis of liver: Secondary | ICD-10-CM | POA: Diagnosis present

## 2014-04-24 DIAGNOSIS — Z91158 Patient's noncompliance with renal dialysis for other reason: Secondary | ICD-10-CM

## 2014-04-24 DIAGNOSIS — I509 Heart failure, unspecified: Secondary | ICD-10-CM | POA: Diagnosis present

## 2014-04-24 DIAGNOSIS — N186 End stage renal disease: Secondary | ICD-10-CM | POA: Diagnosis present

## 2014-04-24 DIAGNOSIS — R188 Other ascites: Secondary | ICD-10-CM | POA: Diagnosis present

## 2014-04-24 DIAGNOSIS — E8770 Fluid overload, unspecified: Secondary | ICD-10-CM | POA: Diagnosis present

## 2014-04-24 DIAGNOSIS — R9431 Abnormal electrocardiogram [ECG] [EKG]: Secondary | ICD-10-CM | POA: Diagnosis present

## 2014-04-24 DIAGNOSIS — Z91041 Radiographic dye allergy status: Secondary | ICD-10-CM

## 2014-04-24 DIAGNOSIS — Z9115 Patient's noncompliance with renal dialysis: Secondary | ICD-10-CM

## 2014-04-24 DIAGNOSIS — I120 Hypertensive chronic kidney disease with stage 5 chronic kidney disease or end stage renal disease: Secondary | ICD-10-CM | POA: Diagnosis present

## 2014-04-24 HISTORY — DX: Headache: R51

## 2014-04-24 HISTORY — DX: Anemia in other chronic diseases classified elsewhere: D63.8

## 2014-04-24 HISTORY — DX: Personal history of other medical treatment: Z92.89

## 2014-04-24 HISTORY — DX: Migraine, unspecified, not intractable, without status migrainosus: G43.909

## 2014-04-24 HISTORY — DX: Cardiac murmur, unspecified: R01.1

## 2014-04-24 LAB — CBC WITH DIFFERENTIAL/PLATELET
Basophils Absolute: 0 10*3/uL (ref 0.0–0.1)
Basophils Relative: 0 % (ref 0–1)
Eosinophils Absolute: 0.4 10*3/uL (ref 0.0–0.7)
Eosinophils Relative: 5 % (ref 0–5)
HCT: 32.7 % — ABNORMAL LOW (ref 36.0–46.0)
HEMOGLOBIN: 10.2 g/dL — AB (ref 12.0–15.0)
Lymphocytes Relative: 14 % (ref 12–46)
Lymphs Abs: 1 10*3/uL (ref 0.7–4.0)
MCH: 28.3 pg (ref 26.0–34.0)
MCHC: 31.2 g/dL (ref 30.0–36.0)
MCV: 90.8 fL (ref 78.0–100.0)
MONOS PCT: 5 % (ref 3–12)
Monocytes Absolute: 0.4 10*3/uL (ref 0.1–1.0)
NEUTROS PCT: 76 % (ref 43–77)
Neutro Abs: 5.2 10*3/uL (ref 1.7–7.7)
PLATELETS: 196 10*3/uL (ref 150–400)
RBC: 3.6 MIL/uL — ABNORMAL LOW (ref 3.87–5.11)
RDW: 14.3 % (ref 11.5–15.5)
WBC: 6.9 10*3/uL (ref 4.0–10.5)

## 2014-04-24 LAB — BASIC METABOLIC PANEL
Anion gap: 21 — ABNORMAL HIGH (ref 5–15)
BUN: 83 mg/dL — AB (ref 6–23)
CALCIUM: 7.7 mg/dL — AB (ref 8.4–10.5)
CO2: 23 mEq/L (ref 19–32)
CREATININE: 12.69 mg/dL — AB (ref 0.50–1.10)
Chloride: 98 mEq/L (ref 96–112)
GFR, EST AFRICAN AMERICAN: 4 mL/min — AB (ref >90)
GFR, EST NON AFRICAN AMERICAN: 3 mL/min — AB (ref >90)
Glucose, Bld: 155 mg/dL — ABNORMAL HIGH (ref 70–99)
Potassium: 6 mEq/L — ABNORMAL HIGH (ref 3.7–5.3)
Sodium: 142 mEq/L (ref 137–147)

## 2014-04-24 MED ORDER — LIDOCAINE-PRILOCAINE 2.5-2.5 % EX CREA: 1.0000 "application " | TOPICAL_CREAM | CUTANEOUS | Status: DC | PRN

## 2014-04-24 MED ORDER — SODIUM CHLORIDE 0.9 % IJ SOLN
3.0000 mL | Freq: Two times a day (BID) | INTRAMUSCULAR | Status: DC
  Administered 2014-04-24 – 2014-04-26 (×5): 3 mL via INTRAVENOUS

## 2014-04-24 MED ORDER — NEPRO/CARBSTEADY PO LIQD: 237.0000 mL | ORAL | Status: DC | PRN

## 2014-04-24 MED ORDER — CLONIDINE HCL 0.3 MG PO TABS
0.3000 mg | ORAL_TABLET | Freq: Two times a day (BID) | ORAL | Status: DC
  Administered 2014-04-25 – 2014-04-26 (×5): 0.3 mg via ORAL
  Filled 2014-04-24: qty 3
  Filled 2014-04-24 (×7): qty 1

## 2014-04-24 MED ORDER — PENTAFLUOROPROP-TETRAFLUOROETH EX AERO: 1.0000 "application " | INHALATION_SPRAY | CUTANEOUS | Status: DC | PRN

## 2014-04-24 MED ORDER — HEPARIN SODIUM (PORCINE) 1000 UNIT/ML DIALYSIS
4000.0000 [IU] | INTRAMUSCULAR | Status: DC | PRN
  Filled 2014-04-24: qty 4

## 2014-04-24 MED ORDER — SODIUM CHLORIDE 0.9 % IV SOLN: 100.0000 mL | INTRAVENOUS | Status: DC | PRN

## 2014-04-24 MED ORDER — ACETAMINOPHEN 325 MG PO TABS
650.0000 mg | ORAL_TABLET | Freq: Four times a day (QID) | ORAL | Status: DC | PRN
  Administered 2014-04-25: 650 mg via ORAL
  Filled 2014-04-24: qty 2

## 2014-04-24 MED ORDER — HEPARIN SODIUM (PORCINE) 1000 UNIT/ML DIALYSIS
1000.0000 [IU] | INTRAMUSCULAR | Status: DC | PRN
  Filled 2014-04-24: qty 1

## 2014-04-24 MED ORDER — HYDROCERIN EX CREA
TOPICAL_CREAM | Freq: Two times a day (BID) | CUTANEOUS | Status: DC
  Administered 2014-04-25 – 2014-04-26 (×3): via TOPICAL
  Administered 2014-04-26: 1 via TOPICAL
  Filled 2014-04-24 (×2): qty 113

## 2014-04-24 MED ORDER — LIDOCAINE HCL (PF) 1 % IJ SOLN: 5.0000 mL | INTRAMUSCULAR | Status: DC | PRN

## 2014-04-24 MED ORDER — ACETAMINOPHEN 650 MG RE SUPP: 650.0000 mg | Freq: Four times a day (QID) | RECTAL | Status: DC | PRN

## 2014-04-24 MED ORDER — ALTEPLASE 2 MG IJ SOLR
2.0000 mg | Freq: Once | INTRAMUSCULAR | Status: AC | PRN
Start: 2014-04-24 — End: 2014-04-24
  Filled 2014-04-24: qty 2

## 2014-04-24 MED ORDER — HEPARIN SODIUM (PORCINE) 5000 UNIT/ML IJ SOLN
5000.0000 [IU] | Freq: Three times a day (TID) | INTRAMUSCULAR | Status: DC
  Filled 2014-04-24 (×12): qty 1

## 2014-04-24 MED ORDER — CALCIUM ACETATE 667 MG PO CAPS
1334.0000 mg | ORAL_CAPSULE | Freq: Three times a day (TID) | ORAL | Status: DC
  Administered 2014-04-25 – 2014-04-26 (×4): 1334 mg via ORAL
  Filled 2014-04-24 (×10): qty 2

## 2014-04-24 MED ORDER — CALCIUM CARBONATE ANTACID 500 MG PO CHEW
1.0000 | CHEWABLE_TABLET | Freq: Every day | ORAL | Status: DC | PRN
  Filled 2014-04-24: qty 2

## 2014-04-24 NOTE — H&P (Signed)
Date: 04/24/2014               Patient Name:  Holly Mendoza MRN: [id number]  DOB: [birth year] Age / Sex: 43 y.o., female   PCP: No Pcp Per Patient         Medical Service: Internal Medicine Teaching Service         Attending Physician: Dr. Rocco Serene, MD    First Contact: Dr. Eleonore Chiquito, MD / Eugene Garnet Pager: [phone]  Second Contact: Dr. Darden Palmer Pager: [phone]       After Hours (After 5p/  First Contact Pager: [phone]  weekends / holidays): Second Contact Pager: [phone]   Chief Complaint: "I need dialysis and a paracentesis"  History of Present Illness: Holly Mendoza is a 43 yo woman with a history significant for ESRD (on dialysis since 1996, T, R, Sa) and possible cardiomyopathy. She recently moved from [place] to [place] and has been unable to find placement in a dialysis center here (due to a history of some behavioral problems such as stopping dialysis early). Since arriving in [place] several weeks ago, she has received dialysis here at [facility] on 6/27 7/1 and 7/3. As per her report, she had an unspecified diagnosis of hypertension before she started hemodialysis, then underwent kidney transplant and rejection. Furthermore, the patient receives supplemental paracentesis once per week for volume overload. Today, she felt like "she could not breath, due to the fluid in my belly". She also is photophobic and fatigued. Of note, she did not take her home Clonidine this morning.  Meds: Current Facility-Administered Medications  Medication Dose Route Frequency Provider Last Rate Last Dose  . 0.9 %  sodium chloride infusion  100 mL Intravenous PRN Jay K. Allena Katz, MD      . 0.9 %  sodium chloride infusion  100 mL Intravenous PRN Vonna Kotyk K. Allena Katz, MD      . acetaminophen (TYLENOL) tablet 650 mg  650 mg Oral Q6H PRN Ky Barban, MD       Or  . acetaminophen (TYLENOL) suppository 650 mg  650 mg Rectal Q6H PRN Ky Barban, MD      . alteplase (CATHFLO ACTIVASE)  injection 2 mg  2 mg Intracatheter Once PRN Vonna Kotyk K. Allena Katz, MD      . Melene Muller ON 04/25/2014] calcium acetate (PHOSLO) capsule 1,334 mg  1,334 mg Oral TID WC Ky Barban, MD      . calcium carbonate (TUMS - dosed in mg elemental calcium) chewable tablet 200-400 mg of elemental calcium  1-2 tablet Oral Daily PRN Ky Barban, MD      . cloNIDine (CATAPRES) tablet 0.3 mg  0.3 mg Oral BID Ky Barban, MD      . feeding supplement (NEPRO CARB STEADY) liquid 237 mL  237 mL Oral PRN Hartley Barefoot. Allena Katz, MD      . heparin injection 1,000 Units  1,000 Units Dialysis PRN Hartley Barefoot. Allena Katz, MD      . heparin injection 4,000 Units  4,000 Units Dialysis PRN Hartley Barefoot. Allena Katz, MD      . heparin injection 5,000 Units  5,000 Units Subcutaneous 3 times per day Ky Barban, MD      . hydrocerin (EUCERIN) cream   Topical BID Ky Barban, MD      . lidocaine (PF) (XYLOCAINE) 1 % injection 5 mL  5 mL Intradermal PRN Hartley Barefoot. Allena Katz, MD      . lidocaine-prilocaine (EMLA) cream 1 application  1 application Topical PRN Hartley BarefootJay K. Allena KatzPatel, MD      . pentafluoroprop-tetrafluoroeth Peggye Pitt(GEBAUERS) aerosol 1 application  1 application Topical PRN Hartley BarefootJay K. Allena KatzPatel, MD      . sodium chloride 0.9 % injection 3 mL  3 mL Intravenous Q12H Ky BarbanSolianny D Kennerly, MD        Allergies: Allergies as of 04/24/2014 - Review Complete 04/24/2014  Allergen Reaction Noted  . Contrast media [iodinated diagnostic agents] Anaphylaxis 04/14/2014  . Vancomycin Anaphylaxis and Hives 04/14/2014   Past Medical History  Diagnosis Date  . 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 Hypertension   . ESRD on hemodialysis 04/20/2014    Patient started HD in 1998.  She has been dialyzed in [place] at "[facility]" until moving to [place] in July 2015 to live with family.  She has had failed accesses in the L arm.  No attempt to place access was made in the R arm, patient is not sure why.  She has a L thigh AVG which  she says has been functional for 7-8 years.  They stopped giving her heparin several years ago due to prolonged access bleeding.     Past Surgical History  Procedure Laterality Date  . Arteriovenous graft placement Left     thigh  . 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 History   Social History  . Marital Status: Single    Spouse Name: N/A    Number of Children: N/A  . Years of Education: N/A   Occupational History  . Not on file.   Social History Main Topics  . Smoking status: Never Smoker   . Smokeless tobacco: Not on file  . Alcohol Use: No  . Drug Use: No  . Sexual Activity: Not on file   Other Topics Concern  . Not on file   Social History Narrative  . No narrative on file    Review of Systems: Constitutional: fatigue HEENT: photophobia Cardio: no chest pain, no palpitations Pulm: shortness of breath GI: normal BMs (1x/day), no nausea or vomiting  GU: does not produce urine Extremities: bilateral lower extremity rash  Neurologic: no numbness or tingling  Physical Exam: General: well developed, appears fatigued, wearing sunglasses while sitting in room HEENT: No periorbital edema, JVP extends to jaw Cardiac: regular rate, tachycardic, flow murmur, no R/G Lungs: CTAB, no W/R/R Abdomen: +BS, reducible, nonpainful umbilical hernia, impressive distension with subtle fluid wave Extremities: no obvious LE edema, scratch marks on LE but no obvious rash, thick skin with darkening consistent with chronic venous stasis Neurological: CN II-XII intact, subtle tremor on arm extension Psychiatric: normal affect, question some cognitive slowing Access in left groin, strong thrill, clear bruit  Physical Exam: Blood pressure 171/86, pulse 97, temperature 98 F (36.7 C), temperature source Oral, resp. rate 23, SpO2 97.00%.   Lab results: Basic Metabolic Panel:  Recent Labs  16/07/9606/07/15 1428    NA 142  K 6.0*  CL 98  CO2 23  GLUCOSE 155*  BUN 83*  CREATININE 12.69*  CALCIUM 7.7*   Liver Function Tests: No results found for this basename: AST, ALT, ALKPHOS, BILITOT, PROT, ALBUMIN,  in the last 72 hours No results found for this basename: LIPASE, AMYLASE,  in the last 72 hours No results found for this basename: AMMONIA,  in the last 72 hours CBC:  Recent  Labs  04/24/14 1428  WBC 6.9  NEUTROABS 5.2  HGB 10.2*  HCT 32.7*  MCV 90.8  PLT 196   Drugs of Abuse  No results found for this basename: labopia, cocainscrnur, labbenz, amphetmu, thcu, labbarb   Alcohol Level: No results found for this basename: ETH,  in the last 72 hours Urinalysis: No results found for this basename: COLORURINE, APPERANCEUR, LABSPEC, PHURINE, GLUCOSEU, HGBUR, BILIRUBINUR, KETONESUR, PROTEINUR, UROBILINOGEN, NITRITE, LEUKOCYTESUR,  in the last 72 hours  Imaging results:  Dg Chest 2 View  04/24/2014   CLINICAL DATA:  Abdominal pain, shortness of breath, dialysis patient.  EXAM: CHEST  2 VIEW  COMPARISON:  Chest radiograph April 18, 2014  FINDINGS: The cardiac silhouette appears moderately enlarged, similar. Mediastinal silhouette is nonsuspicious. Slightly increasing central pulmonary vasculature congestion with interstitial prominence. Increasing for suspected right pleural effusion, with the right chest wall pleural thickening. No pneumothorax. Surgical clips in left upper extremity soft tissues. Osseous structures are nonsuspicious.  IMPRESSION: Stable cardiomegaly, worsening central pulmonary vasculature congestion/interstitial edema and apparent increasing right lung base small pleural effusion. Right lower lobe patchy airspace opacity may reflect confluent edema.   Electronically Signed   By: Awilda Metro   On: 04/24/2014 15:09   Other results: EKG: NSR, rate 97, ?RBBB in V2, QTc 520  Assessment & Plan by Problem: Active Problems:   Volume overload  43 year old woman with ESRD who has  been requiring HD and weekly paracentesis, here for both of these treatments.  ESRD: need to obtain records to get a better sense for the origins of her disease and transplant history - to HD tonight or tomorrow - continue home calcium acetate 1334 mg TID with meals  Abdominal distension: - re-evaluate tomorrow; consider therapeutic paracentesis tomorrow if still distended - obtain records from [facility] tomorrow - hepatic function pending  ?Cardiomegaly: in history and moderately enlarged on CXR - consider echo to re-evaluate this finding - obtain records from [facility] tomorrow  Long QTc: - avoid zofran - repeat EKG after dialysis  Hypertension:  - restart home clonidine 0.3 mg BID; consider different BP regimen as outpatient, considering patient's tendency to skip doses and risk of rebound hypertension  GERD:  - continue home calcium carbonate 500 mg PRN  DVT Ppx:  - heparin 5,000 U q8hrs  Diet: renal  Dispo: Disposition is deferred at this time, awaiting improvement of current medical problems. Anticipated discharge in approximately 2 day(s).   The patient does not have a current PCP (No Pcp Per Patient) and does need an [facility] hospital follow-up appointment after discharge.  The patient does not know have transportation limitations that hinder transportation to clinic appointments.  Signed: Dionne Ano, MD 04/24/2014, 7:50 PM

## 2014-04-24 NOTE — ED Notes (Addendum)
MD at bedside.campos

## 2014-04-24 NOTE — H&P (Signed)
  I have seen and examined the patient, and reviewed the daily progress note by Delford Field, MS 3 and discussed the care of the patient with them. Please see my progress note from 04/24/2014 for further details regarding assessment and plan.    Signed:  Dionne Ano, MD 04/24/2014, 9:53 PM

## 2014-04-24 NOTE — ED Provider Notes (Signed)
Medical screening examination/treatment/procedure(s) were conducted as a shared visit with non-physician practitioner(s) and myself.  I personally evaluated the patient during the encounter.   EKG Interpretation   Date/Time:  Tuesday April 24 2014 14:23:35 EDT Ventricular Rate:  97 PR Interval:  142 QRS Duration: 145 QT Interval:  409 QTC Calculation: 520 R Axis:   96 Text Interpretation:  Sinus rhythm RBBB and LPFB No significant change was  found Confirmed by Chenille Toor  MD, Mykal Batiz ([id number]) on 04/24/2014 4:18:23 PM      Given worsening renal function and signs of volume overload the patient will need acute dialysis today.  I am not convinced she needs paracentesis at this time.  Case management involved for this difficult situation.  Lyanne Co, MD 04/24/14 [phone]

## 2014-04-24 NOTE — Consult Note (Signed)
Reason for Consult: Volume overload, hyperkalemia in a patient with ESRD Referring Physician: Orlie Dakin M.D. ED physician  HPI:  43 year old African American woman with past medical history significant for end-stage renal disease on hemodialysis for the past 15 or so years. Previously lived in [place] and moved to [place] due to changes in her family structure. Unfortunately, was unable to be locally placed at the dialysis center due to concerns with behavioral problems (stopping dialysis after only 2 hours, recurrent hospitalizations). She also apparently has been getting weekly paracentesis for recurrent symptomatic ascites. Her last hemodialysis treatment was on 04/20/2014 and she comes in again today with shortness of breath and increased abdominal distention/discomfort. She denies any fevers or chills or cough or sputum production. Denies any nausea, vomiting or diarrhea.  In the emergency room-she is noted to be volume overloaded and hyperkalemic.  She apparently has a Electrical engineer who is trying to find a local dialysis facility.    Past Medical History  Diagnosis Date  . 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 Hypertension   . ESRD on hemodialysis 04/20/2014    Patient started HD in 1998.  She has been dialyzed in [place] at "[facility]" until moving to [place] in July 2015 to live with family.  She has had failed accesses in the L arm.  No attempt to place access was made in the R arm, patient is not sure why.  She has a L thigh AVG which she says has been functional for 7-8 years.  They stopped giving her heparin several years ago due to prolonged access bleeding.      Past Surgical History  Procedure Laterality Date  . Arteriovenous graft placement Left     thigh  . 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  Social History:  reports that  she has never smoked. She does not have any smokeless tobacco history on file. She reports that she does not drink alcohol or use illicit drugs.  Allergies:  Allergies  Allergen Reactions  . Contrast Media [Iodinated Diagnostic Agents] Anaphylaxis  . Vancomycin Anaphylaxis and Hives    Medications: Scheduled:  Results for orders placed during the hospital encounter of 04/24/14 (from the past 48 hour(s))  BASIC METABOLIC PANEL     Status: Abnormal   Collection Time    04/24/14  2:28 PM      Result Value Ref Range   Sodium 142  137 - 147 mEq/L   Potassium 6.0 (*) 3.7 - 5.3 mEq/L   Chloride 98  96 - 112 mEq/L   CO2 23  19 - 32 mEq/L   Glucose, Bld 155 (*) 70 - 99 mg/dL   BUN 83 (*) 6 - 23 mg/dL   Creatinine, Ser 12.69 (*) 0.50 - 1.10 mg/dL   Calcium 7.7 (*) 8.4 - 10.5 mg/dL   GFR calc non Af Amer 3 (*) >90 mL/min   GFR calc Af Amer 4 (*) >90 mL/min   Comment: (NOTE)     The eGFR has been calculated using the CKD EPI equation.     This calculation has not been validated in all clinical situations.     eGFR's persistently <90 mL/min signify possible Chronic Kidney     Disease.   Anion gap 21 (*) 5 - 15  CBC WITH DIFFERENTIAL  Status: Abnormal   Collection Time    04/24/14  2:28 PM      Result Value Ref Range   WBC 6.9  4.0 - 10.5 K/uL   RBC 3.60 (*) 3.87 - 5.11 MIL/uL   Hemoglobin 10.2 (*) 12.0 - 15.0 g/dL   HCT 32.7 (*) 36.0 - 46.0 %   MCV 90.8  78.0 - 100.0 fL   MCH 28.3  26.0 - 34.0 pg   MCHC 31.2  30.0 - 36.0 g/dL   RDW 14.3  11.5 - 15.5 %   Platelets 196  150 - 400 K/uL   Neutrophils Relative % 76  43 - 77 %   Neutro Abs 5.2  1.7 - 7.7 K/uL   Lymphocytes Relative 14  12 - 46 %   Lymphs Abs 1.0  0.7 - 4.0 K/uL   Monocytes Relative 5  3 - 12 %   Monocytes Absolute 0.4  0.1 - 1.0 K/uL   Eosinophils Relative 5  0 - 5 %   Eosinophils Absolute 0.4  0.0 - 0.7 K/uL   Basophils Relative 0  0 - 1 %   Basophils Absolute 0.0  0.0 - 0.1 K/uL    Dg Chest 2  View  04/24/2014   CLINICAL DATA:  Abdominal pain, shortness of breath, dialysis patient.  EXAM: CHEST  2 VIEW  COMPARISON:  Chest radiograph April 18, 2014  FINDINGS: The cardiac silhouette appears moderately enlarged, similar. Mediastinal silhouette is nonsuspicious. Slightly increasing central pulmonary vasculature congestion with interstitial prominence. Increasing for suspected right pleural effusion, with the right chest wall pleural thickening. No pneumothorax. Surgical clips in left upper extremity soft tissues. Osseous structures are nonsuspicious.  IMPRESSION: Stable cardiomegaly, worsening central pulmonary vasculature congestion/interstitial edema and apparent increasing right lung base small pleural effusion. Right lower lobe patchy airspace opacity may reflect confluent edema.   Electronically Signed   By: Elon Alas   On: 04/24/2014 15:09    Review of Systems  Constitutional: Positive for malaise/fatigue. Negative for fever and chills.  HENT: Negative.   Respiratory: Positive for shortness of breath. Negative for cough, hemoptysis, sputum production and wheezing.   Cardiovascular: Positive for palpitations, orthopnea and leg swelling. Negative for chest pain and claudication.  Gastrointestinal: Positive for nausea and abdominal pain. Negative for heartburn, vomiting, diarrhea and constipation.       Abdominal distention from recurrent ascites  Genitourinary: Negative for dysuria and urgency.  Musculoskeletal: Positive for back pain. Negative for myalgias and neck pain.  Skin: Negative.   Neurological: Negative for dizziness, tingling and tremors.  Endo/Heme/Allergies: Negative.   All other systems reviewed and are negative.  Blood pressure 181/98, pulse 95, temperature 98 F (36.7 C), temperature source Oral, resp. rate 23, SpO2 100.00%. Physical Exam  Nursing note and vitals reviewed. Constitutional: She is oriented to person, place, and time. She appears well-developed and  well-nourished.  HENT:  Head: Normocephalic and atraumatic.  Nose: Nose normal.  Mouth/Throat: No oropharyngeal exudate.  Eyes: EOM are normal. Pupils are equal, round, and reactive to light. No scleral icterus.  Neck: Normal range of motion. Neck supple. JVD present. No tracheal deviation present. No thyromegaly present.  8-10 cm JVD  Cardiovascular: Regular rhythm.   Murmur heard. Regular [id number], ejection systolic murmur over apex  Respiratory: Effort normal. No respiratory distress. She has no wheezes. She has rales.  Fine bibasal crackles  GI: She exhibits distension. There is tenderness.  Firm and distended abdomen  Musculoskeletal: Normal range of motion.  She exhibits edema. She exhibits no tenderness.  3+ hard woody edema with chronic hyperpigmentation changes of skin  Neurological: She is alert and oriented to person, place, and time. No cranial nerve deficit. Coordination normal.  Skin: Skin is warm and dry. No rash noted. No erythema.    Assessment/Plan: 1. End-stage renal disease with volume overload: Emergent hemodialysis has been ordered for today and will plan to preemptively for tomorrow as I anticipate that she'll still be here for paracentesis. 2. Hyperkalemia: Emergent hemodialysis, reeducated on dietary potassium restriction-difficult to control without schedule/regular dialysis 3. Hypertension: Anticipate will improve with hemodialysis, compliance with medications stressed 4. Metabolic bone disease: Reminded her need for compliance with low phosphorus diet and continued adherence with her binders 5. Recurrent ascites: This is due to inadequate hemodialysis, anticipate paracentesis   Novella Abraha K. 04/24/2014, 4:01 PM

## 2014-04-24 NOTE — ED Notes (Signed)
Pt here for abd pain reports need for paracentesis and dialysis. sts moved here from [place], pt reports she is SOB and feels it is related to fluid.

## 2014-04-24 NOTE — ED Notes (Signed)
Pt presents to department for evaluation of abdominal pain. States she has paracentesis x1 week to remove fluid from abdomen. Also states she is scheduled for hemodialysis today. 8/10 pain to abdomen. Respirations unlabored. Pt is alert and oriented x4.

## 2014-04-24 NOTE — ED Notes (Signed)
Attempted report 

## 2014-04-24 NOTE — H&P (Signed)
Date: 04/24/2014               Patient Name:  Holly Mendoza MRN: [id number]  DOB: 05/18/1971 Age / Sex: 43 y.o., female   PCP: No Pcp Per Patient              Medical Service: Internal Medicine Teaching Service              Attending Physician: Dr. Rocco SereneLawrence D Klima, MD    First Contact: Delford Fieldhristine Dequante Tremaine, MS3 Pager: [phone]  Second Contact: Dr. Eleonore ChiquitoJulie Mallory Pager: [phone]  Third Contact Dr. Darden PalmerSamaya Qureshi Pager: [phone]       After Hours (After 5p/  First Contact Pager: [phone]  weekends / holidays): Second Contact Pager: [phone]   Chief Complaint: "I need dialysis and paracentesis"   History of Present Illness: Ms. Holly Mendoza is a 43 yo female with PMH significant for ESRD-on HD since 1996, hypertension, and questionable cardiomyopathy who presents to the ED for dialysis. She previously received HD in [place] prior to moving to [place] at the end of June with no plan in place for dialysis. She has not yet been placed locally at an outpatient dialysis center but has received dialysis three [facility] since moving to here (last on 7/3). She also reports getting weekly therapeutic paracentesis at [facility] for recurrent abdominal distention when living in [place]. Ms. Holly Mendoza comes in today with shortness of breath, increased abdominal distention/discomfort, a nonproductive cough and mild lower extremity swelling. She is also photophobic. She denies fever, chills, nausea, vomiting and headache.   Meds: Medications Prior to Admission  Medication Sig Dispense Refill  . calcium acetate (PHOSLO) 667 MG capsule Take 1,334 mg by mouth 3 (three) times daily with meals.      . calcium carbonate (TUMS - DOSED IN MG ELEMENTAL CALCIUM) 500 MG chewable tablet Chew 1-2 tablets by mouth daily as needed for indigestion or heartburn.      . cloNIDine (CATAPRES) 0.3 MG tablet Take 0.3 mg by mouth 2 (two) times daily.        Current Facility-Administered Medications  Medication Dose  Route Frequency Provider Last Rate Last Dose  . 0.9 %  sodium chloride infusion  100 mL Intravenous PRN Jay K. Allena KatzPatel, MD      . 0.9 %  sodium chloride infusion  100 mL Intravenous PRN Vonna KotykJay K. Allena KatzPatel, MD      . alteplase (CATHFLO ACTIVASE) injection 2 mg  2 mg Intracatheter Once PRN Vonna KotykJay K. Allena KatzPatel, MD      . feeding supplement (NEPRO CARB STEADY) liquid 237 mL  237 mL Oral PRN Vonna KotykJay K. Allena KatzPatel, MD      . heparin injection 1,000 Units  1,000 Units Dialysis PRN Hartley BarefootJay K. Allena KatzPatel, MD      . heparin injection 4,000 Units  4,000 Units Dialysis PRN Hartley BarefootJay K. Allena KatzPatel, MD      . lidocaine (PF) (XYLOCAINE) 1 % injection 5 mL  5 mL Intradermal PRN Vonna KotykJay K. Allena KatzPatel, MD      . lidocaine-prilocaine (EMLA) cream 1 application  1 application Topical PRN Vonna KotykJay K. Allena KatzPatel, MD      . pentafluoroprop-tetrafluoroeth Peggye Pitt(GEBAUERS) aerosol 1 application  1 application Topical PRN Hartley BarefootJay K. Allena KatzPatel, MD        Allergies: Allergies as of 04/24/2014 - Review Complete 04/24/2014  Allergen Reaction Noted  . Contrast media [iodinated diagnostic agents] Anaphylaxis 04/14/2014  . Vancomycin Anaphylaxis and Hives 04/14/2014   Past Medical History  Diagnosis Date  .  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 Hypertension   . ESRD on hemodialysis 04/20/2014    Patient started HD in 1998.  She has been dialyzed in [place] at "[facility]" until moving to [place] in July 2015 to live with family.  She has had failed accesses in the L arm.  No attempt to place access was made in the R arm, patient is not sure why.  She has a L thigh AVG which she says has been functional for 7-8 years.  They stopped giving her heparin several years ago due to prolonged access bleeding.     Past Surgical History  Procedure Laterality Date  . Arteriovenous graft placement Left     thigh  . 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 History   Social History  .  Marital Status: Single    Spouse Name: N/A    Number of Children: N/A  . Years of Education: N/A   Occupational History  . Not employed   Social History Main Topics  . Smoking status: Never Smoker   . Smokeless tobacco: Not on file  . Alcohol Use: No  . Drug Use: No  . Sexual Activity: Not on file   Other Topics Concern  . Not on file   Social History Narrative  . [name] recently moved from [place] to [place] to be near to her aunts and live with her elderly grandmother.     Review of Systems:    Constitutional: positive for fatigue, negative for fever and chills  HEENT: photophobia Respiratory: positive for nonproductive cough and shortness of breath, negative for pain with breathing Cardiovascular: positive for orthopnea and leg swelling, negative for chest pain and palpitations Gastrointestinal: positive for abdominal distention/discomfort, negative for nausea, vomiting and constipation  Skin: positive for bilateral LE dryness, pruritus, rash and skin color change Neurological: negative for numbness, tingling and pain in joints  Physical Exam: Blood pressure 171/86, pulse 97, temperature 98 F (36.7 C), temperature source Oral, resp. rate 23, SpO2 97.00%. BP 171/86  Pulse 97  Temp(Src) 98 F (36.7 C) (Oral)  Resp 23  SpO2 97% General appearance: Alert but tired appearing female in no acute distress, pt wearing sunglasses in room Eyes: no orbital edema  Lungs: bilateral basilar crackles, no wheezing   Heart: regular rate and rhythm, flow murmur, JVD to the jaw line  Abdomen: normal findings: bowel sounds normal and no tenderness to palpation; abnormal findings: ascites, + fluid wave, reducible nontender umbilical hernia  Extremities: trace edema thick, dry skin with brawny changes  Skin: LE with thick, dry, hyperpigmented skin  Psychiatric: normal affect, possible cognitive slowing  Access in left groin with strong thrill and bruit  Lab  results: CBC    Component Value Date/Time   WBC 6.9 04/24/2014 1428   RBC 3.60* 04/24/2014 1428   HGB 10.2* 04/24/2014 1428   HCT 32.7* 04/24/2014 1428   PLT 196 04/24/2014 1428   MCV 90.8 04/24/2014 1428   MCH 28.3 04/24/2014 1428   MCHC 31.2 04/24/2014 1428   RDW 14.3 04/24/2014 1428   LYMPHSABS 1.0 04/24/2014 1428   MONOABS 0.4 04/24/2014 1428   EOSABS 0.4 04/24/2014 1428   BASOSABS 0.0 04/24/2014 1428   BMET    Component Value Date/Time   NA 142 04/24/2014 1428   K 6.0* 04/24/2014 1428  CL 98 04/24/2014 1428   CO2 23 04/24/2014 1428   GLUCOSE 155* 04/24/2014 1428   BUN 83* 04/24/2014 1428   CREATININE 12.69* 04/24/2014 1428   CALCIUM 7.7* 04/24/2014 1428   GFRNONAA 3* 04/24/2014 1428   GFRAA 4* 04/24/2014 1428   Imaging results:  Dg Chest 2 View  04/24/2014   CLINICAL DATA:  Abdominal pain, shortness of breath, dialysis patient.  EXAM: CHEST  2 VIEW  COMPARISON:  Chest radiograph April 18, 2014  FINDINGS: The cardiac silhouette appears moderately enlarged, similar. Mediastinal silhouette is nonsuspicious. Slightly increasing central pulmonary vasculature congestion with interstitial prominence. Increasing for suspected right pleural effusion, with the right chest wall pleural thickening. No pneumothorax. Surgical clips in left upper extremity soft tissues. Osseous structures are nonsuspicious.  IMPRESSION: Stable cardiomegaly, worsening central pulmonary vasculature congestion/interstitial edema and apparent increasing right lung base small pleural effusion. Right lower lobe patchy airspace opacity may reflect confluent edema.   Electronically Signed   By: Awilda Metro   On: 04/24/2014 15:09    Assessment & Plan by Problem: Active Problems:   Volume overload  Ms. Donovan is a 43 yo female with PMH significant for ESRD-on HD since 1996, malignant hypertension, and questionable cardiomyopathy who presents to the ED for dialysis with SOB, abdominal discomfort/distenstion, nonproductive cough and mild lower extremity  edema. Her symptoms are consistent with volume overload in the setting of ESRD w/ inconsistent hemodialysis. It is hoped that emergent hemodialysis will improve her symptoms and facilitation of outpatient dialysis will prevent reoccurence.   1. Volume Overload in setting of ESRD w/ no outside follow-up  - Emergent hemodialysis tonight. Will reassess s/p dialysis.  - She is currently working with a Child psychotherapist to set up local outpatient dialysis.  - Obtain records to clarify history that precipitated HD and paracentesis   2. Ascites: Likely secondary to inconsistency of and delay in dialysis. CHF is a possible alternative etiology due to her questionable history of cardiomyopathy, cardiomegaly on CXR and flow murmur on exam. Consider echo for further evaluation. Spontaneous bacterial peritonitis is a must not miss diagnosis but is unlikely due to absence of tenderness on palpation, fever, and leukocytosis.  -Will reassess in morning for need for therapeutic paracentesis  -Consider U/S to assess size of ascites  -Obtain records from [place] in [place]   3. Hypertension: Likely due to fluid overload and inconsistent outpatient clonidine usage. Patient reports holding her clonidine on HD days until after receiving dialysis.  - Improvement anticipated with hemodialysis  - Clonidine 0.3 mg BID restarted to be held for SBP less than 120,  - Consider alterative HTN treatment due to patient's noncompliance with BID clonidine   4. Hyperkalemia:  - Emergent hemodialysis ordered   5. DVT prophylaxis:  - Heparin 5,000 U q 8 hrs   6. Long QTc: - Avoid QTc prolonging medications - Repeat EKG s/p dialysis   7. Diet: renal  8. Dispo: Disposition deferred, awaiting improvement of current medical problems. Anticipated discharge in approximately 2 days.    This is a Psychologist, occupational Note.  The care of the patient was discussed with Dr. Leatha Gilding and the assessment and plan was formulated with  their assistance.  Please see their note for official documentation of the patient encounter.   Signed: Abundio Miu, Med Student 04/24/2014, 6:28 PM

## 2014-04-24 NOTE — Progress Notes (Signed)
  CARE MANAGEMENT ED NOTE 04/24/2014  Patient:  Holly Mendoza, Holly Mendoza   Account Number:  [id number]  Date Initiated:  04/24/2014  Documentation initiated by:  [facility]  Subjective/Objective Assessment:   Pt presented to [facility] ED in need of HD     Subjective/Objective Assessment Detail:     Action/Plan:   Action/Plan Detail:   Anticipated DC Date:  04/24/2014     Status Recommendation to Physician:   Result of Recommendation:  Agreed  Other ED [place]  Other   [facility] Choice  NA   Choice offered to / List presented to:  C-1 Patient          Status of service:  Completed, signed off  ED Comments:   ED Comments Detail:  04/24/2014 15:53 Wendi Maya RN BSN [phone] ED CM consulted by Dr. Venora Maples concerning OP HD, Patient presented to [facility] ED needing HD. Patient is known to Probation officer, met with patient last week regarding OP HD. ESRD on hemodialysis- pt is transient and does not have outpatient HD arranged as of yet. She was previously declined, spoke with Bryson Dames HD OP Coordinator in HD at [phone] she stated that she is working on some referrals to  A couple of HD Centers awaiting responses. I spoke with patient at bedside and updated her with that information. Had patient contact Bethena Roys while at bedside to confirm updated information. Patient provided verbal consent with Bethena Roys to expand the HD search to other counties. Pt is agreeable to this option. Discussed  information regarding plan for OP HD with Dr. Venora Maples he is agreeable. Nephrology consult pending for HD today. No further ED CM needs identified

## 2014-04-24 NOTE — ED Provider Notes (Signed)
CSN: [id number]     Arrival date & time 04/24/14  1318 History   First MD Initiated Contact with Patient 04/24/14 1355     Chief Complaint  Patient presents with  . Abdominal Pain     (Consider location/radiation/quality/duration/timing/severity/associated sxs/prior Treatment) HPI Comments: The patient is a 43 year old female with a past medical history of end-stage renal disease, currently on HD presents emergency room requesting dialysis and paracentesis. The patient was previously HD 3 days per week in [place] prior to her move and a weekly paracentesis. She reports abdominal distention. And shortness of breath. The patient reports weekly paracentesis, approximately 7 L each week.  The patient plans on staying in the area, and established care. NO PCP  Patient is a 43 y.o. female presenting with abdominal pain. The history is provided by the patient. No language interpreter was used.  Abdominal Pain Associated symptoms: shortness of breath   Associated symptoms: no chest pain, no chills and no fever     Past Medical History  Diagnosis Date  . 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 Hypertension   . ESRD on hemodialysis 04/20/2014    Patient started HD in 1998.  She has been dialyzed in [place] at "[facility]" until moving to [place] in July 2015 to live with family.  She has had failed accesses in the L arm.  No attempt to place access was made in the R arm, patient is not sure why.  She has a L thigh AVG which she says has been functional for 7-8 years.  They stopped giving her heparin several years ago due to prolonged access bleeding.     Past Surgical History  Procedure Laterality Date  . Arteriovenous graft placement Left     thigh  . 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 History  Substance Use Topics  . Smoking status: Never Smoker   . Smokeless  tobacco: Not on file  . Alcohol Use: No   OB History   Grav Para Term Preterm Abortions TAB SAB Ect Mult Living                 Review of Systems  Constitutional: Negative for fever and chills.  Respiratory: Positive for shortness of breath.   Cardiovascular: Negative for chest pain.  Gastrointestinal: Positive for abdominal pain and abdominal distention.      Allergies  Contrast media and Vancomycin  Home Medications   Prior to Admission medications   Medication Sig Start Date End Date Taking? Authorizing Provider  calcium acetate (PHOSLO) 667 MG capsule Take 1,334 mg by mouth 3 (three) times daily with meals.   Yes Historical Provider, MD  calcium carbonate (TUMS - DOSED IN MG ELEMENTAL CALCIUM) 500 MG chewable tablet Chew 1-2 tablets by mouth daily as needed for indigestion or heartburn.   Yes Historical Provider, MD  cloNIDine (CATAPRES) 0.3 MG tablet Take 0.3 mg by mouth 2 (two) times daily.   Yes Historical Provider, MD   BP 180/94  Pulse 97  Temp(Src) 98 F (36.7 C) (Oral)  SpO2 99% Physical Exam  Nursing note and vitals reviewed. Constitutional: She is oriented to person, place, and time. She appears well-developed and well-nourished.  Non-toxic appearance. She does not have a sickly appearance. She does not appear ill. No distress.  HENT:  Head: Normocephalic and atraumatic.  Eyes: EOM  are normal. Pupils are equal, round, and reactive to light. Right eye exhibits no discharge. Left eye exhibits no discharge. No scleral icterus.  Neck: Normal range of motion. Neck supple.  Cardiovascular: Normal rate and regular rhythm.   Murmur heard. 1+ bilateral pitting edema to lower extremity. Left upper thigh AV fistula good palpable thrill.  Pulmonary/Chest: Effort normal and breath sounds normal. No respiratory distress. She has no wheezes. She has no rales. She exhibits no tenderness.  Patient is able to speak in complete sentences.   Abdominal: Soft. Bowel sounds are  normal. She exhibits distension. There is no tenderness. There is no rebound and no guarding.  Musculoskeletal: Normal range of motion. She exhibits no edema.  Neurological: She is alert and oriented to person, place, and time.  Skin: Skin is warm and dry. No rash noted. She is not diaphoretic.  Psychiatric: She has a normal mood and affect. Her behavior is normal. Thought content normal.    ED Course  Procedures (including critical care time) Labs Review Results for orders placed during the hospital encounter of 04/24/14  BASIC METABOLIC PANEL      Result Value Ref Range   Sodium 142  137 - 147 mEq/L   Potassium 6.0 (*) 3.7 - 5.3 mEq/L   Chloride 98  96 - 112 mEq/L   CO2 23  19 - 32 mEq/L   Glucose, Bld 155 (*) 70 - 99 mg/dL   BUN 83 (*) 6 - 23 mg/dL   Creatinine, Ser 74.12 (*) 0.50 - 1.10 mg/dL   Calcium 7.7 (*) 8.4 - 10.5 mg/dL   GFR calc non Af Amer 3 (*) >90 mL/min   GFR calc Af Amer 4 (*) >90 mL/min   Anion gap 21 (*) 5 - 15  CBC WITH DIFFERENTIAL      Result Value Ref Range   WBC 6.9  4.0 - 10.5 K/uL   RBC 3.60 (*) 3.87 - 5.11 MIL/uL   Hemoglobin 10.2 (*) 12.0 - 15.0 g/dL   HCT 87.8 (*) 67.6 - 72.0 %   MCV 90.8  78.0 - 100.0 fL   MCH 28.3  26.0 - 34.0 pg   MCHC 31.2  30.0 - 36.0 g/dL   RDW 94.7  09.6 - 28.3 %   Platelets 196  150 - 400 K/uL   Neutrophils Relative % 76  43 - 77 %   Neutro Abs 5.2  1.7 - 7.7 K/uL   Lymphocytes Relative 14  12 - 46 %   Lymphs Abs 1.0  0.7 - 4.0 K/uL   Monocytes Relative 5  3 - 12 %   Monocytes Absolute 0.4  0.1 - 1.0 K/uL   Eosinophils Relative 5  0 - 5 %   Eosinophils Absolute 0.4  0.0 - 0.7 K/uL   Basophils Relative 0  0 - 1 %   Basophils Absolute 0.0  0.0 - 0.1 K/uL   Dg Chest 2 View  04/24/2014   CLINICAL DATA:  Abdominal pain, shortness of breath, dialysis patient.  EXAM: CHEST  2 VIEW  COMPARISON:  Chest radiograph April 18, 2014  FINDINGS: The cardiac silhouette appears moderately enlarged, similar. Mediastinal silhouette is  nonsuspicious. Slightly increasing central pulmonary vasculature congestion with interstitial prominence. Increasing for suspected right pleural effusion, with the right chest wall pleural thickening. No pneumothorax. Surgical clips in left upper extremity soft tissues. Osseous structures are nonsuspicious.  IMPRESSION: Stable cardiomegaly, worsening central pulmonary vasculature congestion/interstitial edema and apparent increasing right lung base small  pleural effusion. Right lower lobe patchy airspace opacity may reflect confluent edema.   Electronically Signed   By: Awilda Metroourtnay  Bloomer   On: 04/24/2014 15:09    EKG Interpretation   Date/Time:  Tuesday April 24 2014 14:23:35 EDT Ventricular Rate:  97 PR Interval:  142 QRS Duration: 145 QT Interval:  409 QTC Calculation: 520 R Axis:   96 Text Interpretation:  Sinus rhythm RBBB and LPFB No significant change was  found Confirmed by CAMPOS  MD, Caryn BeeKEVIN ([id number]) on 04/24/2014 4:18:23 PM      MDM   Final diagnoses:  Other hypervolemia  ESRD on hemodialysis   Pleasant pt presents requesting hemodialysis, last dialyzed 4 days ago, normal dialysis 3 times a week, complaining of increased shortness of breath and abdominal distention. X-Ray shows increase in vascular is a patient and pleural effusion likely do to fluid overload, plan to consult to nephrology for hemodialysis. Discussed chest XR, lab findings with the patient, agrees to admission. Discussed patient condition with Dr. Allena KatzPatel who agrees to hemodialysis and paracentesis advises consult to internal medicine for admission. Discussed patient history, condition, treatment plan for hemodialysis at paracentesis with teaching service, agrees to admit the patient.  Meds given in ED:  Medications - No data to display  New Prescriptions   No medications on file        Clabe SealLauren M Viviene Thurston, PA-C 04/24/14 1639

## 2014-04-25 DIAGNOSIS — E875 Hyperkalemia: Secondary | ICD-10-CM

## 2014-04-25 DIAGNOSIS — R143 Flatulence: Secondary | ICD-10-CM

## 2014-04-25 DIAGNOSIS — N186 End stage renal disease: Secondary | ICD-10-CM

## 2014-04-25 DIAGNOSIS — K219 Gastro-esophageal reflux disease without esophagitis: Secondary | ICD-10-CM

## 2014-04-25 DIAGNOSIS — R142 Eructation: Secondary | ICD-10-CM

## 2014-04-25 DIAGNOSIS — R141 Gas pain: Secondary | ICD-10-CM

## 2014-04-25 DIAGNOSIS — I120 Hypertensive chronic kidney disease with stage 5 chronic kidney disease or end stage renal disease: Secondary | ICD-10-CM

## 2014-04-25 LAB — HEPATIC FUNCTION PANEL
ALK PHOS: 151 U/L — AB (ref 39–117)
ALT: 15 U/L (ref 0–35)
ALT: 12 U/L (ref 0–35)
AST: 16 U/L (ref 0–37)
AST: 11 U/L (ref 0–37)
Albumin: 2.3 g/dL — ABNORMAL LOW (ref 3.5–5.2)
Albumin: 2.1 g/dL — ABNORMAL LOW (ref 3.5–5.2)
Alkaline Phosphatase: 142 U/L — ABNORMAL HIGH (ref 39–117)
BILIRUBIN TOTAL: 0.2 mg/dL — AB (ref 0.3–1.2)
Bilirubin, Direct: <0.2 mg/dL (ref 0.0–0.3)
Bilirubin, Direct: <0.2 mg/dL (ref 0.0–0.3)
TOTAL PROTEIN: 6.2 g/dL (ref 6.0–8.3)
TOTAL PROTEIN: 5.9 g/dL — AB (ref 6.0–8.3)
Total Bilirubin: 0.2 mg/dL — ABNORMAL LOW (ref 0.3–1.2)

## 2014-04-25 LAB — BODY FLUID CELL COUNT WITH DIFFERENTIAL
EOS FL: 0 %
Lymphs, Fluid: 48 %
MONOCYTE-MACROPHAGE-SEROUS FLUID: 52 % (ref 50–90)
NEUTROPHIL FLUID: 0 % (ref 0–25)
OTHER CELLS FL: 0 %
WBC FLUID: 143 uL (ref 0–1000)

## 2014-04-25 LAB — BASIC METABOLIC PANEL
Anion gap: 23 — ABNORMAL HIGH (ref 5–15)
BUN: 89 mg/dL — ABNORMAL HIGH (ref 6–23)
CO2: 19 mEq/L (ref 19–32)
Calcium: 7.5 mg/dL — ABNORMAL LOW (ref 8.4–10.5)
Chloride: 98 mEq/L (ref 96–112)
Creatinine, Ser: 13.18 mg/dL — ABNORMAL HIGH (ref 0.50–1.10)
GFR calc non Af Amer: 3 mL/min — ABNORMAL LOW (ref >90)
GFR, EST AFRICAN AMERICAN: 3 mL/min — AB (ref >90)
Glucose, Bld: 122 mg/dL — ABNORMAL HIGH (ref 70–99)
POTASSIUM: 5.9 meq/L — AB (ref 3.7–5.3)
SODIUM: 140 meq/L (ref 137–147)

## 2014-04-25 LAB — CBC
HCT: 33.6 % — ABNORMAL LOW (ref 36.0–46.0)
Hemoglobin: 10.6 g/dL — ABNORMAL LOW (ref 12.0–15.0)
MCH: 28.5 pg (ref 26.0–34.0)
MCHC: 31.5 g/dL (ref 30.0–36.0)
MCV: 90.3 fL (ref 78.0–100.0)
PLATELETS: 183 10*3/uL (ref 150–400)
RBC: 3.72 MIL/uL — AB (ref 3.87–5.11)
RDW: 14.1 % (ref 11.5–15.5)
WBC: 6.8 10*3/uL (ref 4.0–10.5)

## 2014-04-25 LAB — HEMOGLOBIN A1C
Hgb A1c MFr Bld: 5.2 % (ref <5.7)
Mean Plasma Glucose: 103 mg/dL (ref <117)

## 2014-04-25 LAB — PROTEIN, BODY FLUID: Total protein, fluid: 2.1 g/dL

## 2014-04-25 LAB — ALBUMIN, FLUID (OTHER): Albumin, Fluid: 0.9 g/dL

## 2014-04-25 LAB — HIV ANTIBODY (ROUTINE TESTING W REFLEX): HIV 1&2 Ab, 4th Generation: NONREACTIVE

## 2014-04-25 MED ORDER — PROMETHAZINE HCL 25 MG PO TABS
12.5000 mg | ORAL_TABLET | Freq: Four times a day (QID) | ORAL | Status: DC | PRN
Start: 2014-04-25 — End: 2014-04-27

## 2014-04-25 NOTE — Progress Notes (Signed)
Subjective: Patient is feeling better after dialysis, but still experiencing some SOB and fullness in abdomen. Feels like she needs paracentesis.  Objective: Vital signs in last 24 hours: Filed Vitals:   04/25/14 1038 04/25/14 1120 04/25/14 1315 04/25/14 1900  BP: 189/108 187/100 170/87 160/80  Pulse: 98   94  Temp: 97.3 F (36.3 C)   98.1 F (36.7 C)  TempSrc: Oral   Oral  Resp: 20   20  Weight: 198 lb 13.7 oz (90.2 kg)     SpO2: 98%   98%   Weight change:   Intake/Output Summary (Last 24 hours) at 04/25/14 2004 Last data filed at 04/25/14 1901  Gross per 24 hour  Intake    360 ml  Output   2889 ml  Net  -2529 ml   General: well developed, resting in bed, no sunglasses today HEENT: periorbital edema, JVP extends to jaw  Cardiac: regular rate, tachycardic, flow murmur, no R/G  Lungs: CTAB, no W/R/R  Abdomen: +BS, reducible, nonpainful umbilical hernia, impressive distension with subtle fluid wave  Extremities: no obvious LE edema, scratch marks on LE but no obvious rash, thick skin with darkening consistent with chronic venous stasis  Neurological: CN II-XII intact  Psychiatric: normal affect, no cognitive slowing Access in left groin, strong thrill, clear bruit  Lab Results: Na 140 K 5.9 Cl 98 CO2 19 BUN 89 Cr 13.18 Ca 7.5 AG 23  WBC 6.8 H&H 10.6/33.6 Plt 183 HgbA1c 5.5 HIV negative  Alk phos: 142 Albumin 0.9 AST: 11 ALT: 12 Tot Protein: 5.9 Bili direct less than 0.2 Total bili 0.2   Studies/Results: Dg Chest 2 View  04/24/2014   CLINICAL DATA:  Abdominal pain, shortness of breath, dialysis patient.  EXAM: CHEST  2 VIEW  COMPARISON:  Chest radiograph April 18, 2014  FINDINGS: The cardiac silhouette appears moderately enlarged, similar. Mediastinal silhouette is nonsuspicious. Slightly increasing central pulmonary vasculature congestion with interstitial prominence. Increasing for suspected right pleural effusion, with the right chest wall pleural  thickening. No pneumothorax. Surgical clips in left upper extremity soft tissues. Osseous structures are nonsuspicious.  IMPRESSION: Stable cardiomegaly, worsening central pulmonary vasculature congestion/interstitial edema and apparent increasing right lung base small pleural effusion. Right lower lobe patchy airspace opacity may reflect confluent edema.   Electronically Signed   By: Elon Alas   On: 04/24/2014 15:09   Medications: I have reviewed the patient's current medications. Scheduled Meds: . calcium acetate  1,334 mg Oral TID WC  . cloNIDine  0.3 mg Oral BID  . heparin  5,000 Units Subcutaneous 3 times per day  . hydrocerin   Topical BID  . sodium chloride  3 mL Intravenous Q12H   Continuous Infusions:  PRN Meds:.sodium chloride, sodium chloride, acetaminophen, acetaminophen, calcium carbonate, feeding supplement (NEPRO CARB STEADY), heparin, heparin, lidocaine (PF), lidocaine-prilocaine, pentafluoroprop-tetrafluoroeth, promethazine Assessment/Plan: Principal Problem:   Volume overload Active Problems:   Hyperkalemia   ESRD on hemodialysis   Hx of ascites   HTN (hypertension)   Prolonged Q-T interval on ECG  43 year old woman with ESRD who has been requiring HD and weekly paracentesis, here for both of these treatments. Biggest issue now is HD placement going forward, to avoid similar hospitalizations.  ESRD: still attempting to obtain records to get a better sense for the origins of her disease and her transplant history. Received HD this morning (UF 5L removed) and paracentesis this afternoon (4L removed) - continue home calcium acetate 1334 mg TID with meals  Abdominal distension: paracentesis completed with large decrease in distension and regression of umbilical hernia - obtain records from [facility] - hepatic function significant for high alk phos, low albumin and total protein   ?Cardiomegaly: in history and moderately enlarged on CXR  - consider  echo to re-evaluate this finding  - obtain records from [place] QTc:  - avoid zofran - repeat EKG after dialysis   Hypertension:  - restart home clonidine 0.3 mg BID; consider different BP regimen as outpatient, considering patient's tendency to skip doses and risk of rebound hypertension  - if BP remains high despite HD and paracentesis, consider adding lopressor  GERD:  - continue home calcium carbonate 500 mg PRN   Nausea: one episode with emesis today after HD - given phenergan PRN  DVT Ppx:  - heparin 5,000 U q8hrs   Diet: renal  Dispo: Disposition is deferred at this time, awaiting improvement of current medical problems.  Anticipated discharge in approximately 1 day(s).   The patient does not have a current PCP (No Pcp Per Patient) and does need an [facility] hospital follow-up appointment after discharge.  The patient does not have transportation limitations that hinder transportation to clinic appointments.  .Services Needed at time of discharge: Y = Yes, Blank = No PT:   OT:   RN:   Equipment:   Other:     LOS: 1 day   Drucilla Schmidt, MD 04/25/2014, 8:04 PM

## 2014-04-25 NOTE — Progress Notes (Signed)
Internal Medicine Attending Admission Note Date: 04/25/2014  Patient name: Holly Mendoza Medical record number: [id number] Date of birth: 1971-05-20 Age: 43 y.o. Gender: female  I saw and evaluated the patient. I reviewed the resident's note and I agree with the resident's findings and plan as documented in the resident's note.  Chief Complaint(s): Abdominal distention, shortness of breath, need for hemodialysis.  History - key components related to admission:  Holly Mendoza is a 43 year old woman with a history of end-stage renal disease requiring hemodialysis and hypertension who presents with increasing abdominal distention, shortness of breath, and the need for hemodialysis. She recently moved to [place] from [place] where she had been receiving chronic hemodialysis. She did not arrange hemodialysis in [place] prior to her move and has been getting dialysis as needed through the emergency department. Also while in [place], she was getting weekly paracenteses, per her report, for increasing abdominal distention and associated shortness of breath and discomfort. We currently do not have access to her records from [place] so we are unclear as to the cause of the renal failure and the recurrent ascites. It is possible her renal failure was secondary to chronic hypertension and her ascites may be related to inadequate dialysis. She is without other acute complaints.  She underwent dialysis this morning but still notes dyspnea and abdominal distention. She therefore is interested in receiving a paracentesis as well.  Physical Exam - key components related to admission:  Filed Vitals:   04/25/14 0900 04/25/14 0930 04/25/14 1000 04/25/14 1038  BP: 150/86 162/87 173/94 189/108  Pulse: 87 88 90 98  Temp:    97.3 F (36.3 C)  TempSrc:    Oral  Resp:    20  Weight:    198 lb 13.7 oz (90.2 kg)  SpO2:    98%   General: Well-developed, well-nourished, woman lying comfortably on a  stretcher receiving hemodialysis in no acute distress. Abdomen: Soft, nontender, distended, positive fluid wave  Lab results:  Basic Metabolic Panel:  Recent Labs  37/29/02 1428 04/25/14 0753  NA 142 140  K 6.0* 5.9*  CL 98 98  CO2 23 19  GLUCOSE 155* 122*  BUN 83* 89*  CREATININE 12.69* 13.18*  CALCIUM 7.7* 7.5*   Liver Function Tests:  Recent Labs  04/24/14 1428 04/25/14 0600  AST 16 11  ALT 15 12  ALKPHOS 151* 142*  BILITOT 0.2* 0.2*  PROT 6.2 5.9*  ALBUMIN 2.3* 2.1*   CBC:  Recent Labs  04/24/14 1428 04/25/14 0753  WBC 6.9 6.8  NEUTROABS 5.2  --   HGB 10.2* 10.6*  HCT 32.7* 33.6*  MCV 90.8 90.3  PLT 196 183   Hemoglobin A1C:  Recent Labs  04/25/14 0600  HGBA1C 5.2   Imaging results:  Dg Chest 2 View  04/24/2014   CLINICAL DATA:  Abdominal pain, shortness of breath, dialysis patient.  EXAM: CHEST  2 VIEW  COMPARISON:  Chest radiograph April 18, 2014  FINDINGS: The cardiac silhouette appears moderately enlarged, similar. Mediastinal silhouette is nonsuspicious. Slightly increasing central pulmonary vasculature congestion with interstitial prominence. Increasing for suspected right pleural effusion, with the right chest wall pleural thickening. No pneumothorax. Surgical clips in left upper extremity soft tissues. Osseous structures are nonsuspicious.  IMPRESSION: Stable cardiomegaly, worsening central pulmonary vasculature congestion/interstitial edema and apparent increasing right lung base small pleural effusion. Right lower lobe patchy airspace opacity may reflect confluent edema.   Electronically Signed   By: Awilda Metro   On: 04/24/2014 15:09  Assessment & Plan by Problem:  Holly Mendoza is a 43 year old woman with a history of hypertension and end-stage renal disease requiring hemodialysis as well as recurrent ascites who [place] prior to having established a hemodialysis center locally. She presents with  evidence of volume overload requiring hemodialysis and abdominal distention with discomfort requiring a paracentesis.  1) End-stage renal disease requiring hemodialysis: Social work has been involved in trying to establish her with a hemodialysis center locally. This work will continue. She received emergent dialysis this morning for volume overload. We will ask nephrology if they are interested in providing her with another session tomorrow morning. If so, we will likely keep her overnight for tomorrow's hemodialysis. If not, we will likely discharge her home after the paracentesis if she tolerates it well.  2) Abdominal distention: On exam she has ascites and this is likely related to inadequate hemodialysis. We will therefore provide a therapeutic paracentesis in order to give her relief from the abdominal distention and improve her dyspnea. If she tolerates this procedure well I suspect she will be stable for discharge home today with dialysis obtained on an as-needed basis until she establishes with an outpatient hemodialysis center.  3) Hypertension: We will maintain her on her chronic clonidine therapy for now. Given concerns for compliance this may not be the best medication for her but she will require establishment with a local primary care provider to have her hypertension managed. I also suspect there will be improvement in her blood pressure control with consistent hemodialysis, if she is able to achieve such.  4) Disposition: I anticipate she will be ready for discharge home today if she tolerates the paracentesis well. If nephrology is planning on another hemodialysis session tomorrow we will keep her overnight and discharge her after her hemodialysis tomorrow.

## 2014-04-25 NOTE — Progress Notes (Signed)
Subjective: Patient expresses frustration with the fact that she did not receive her clonidine last night resulting in an increase in her blood pressure. Despite undergoing dialysis this morning she still reports dyspnea and abdominal distention. She also complains of headaches, spots in her vision and photophobia   Objective: Vital signs in last 24 hours: Filed Vitals:   04/25/14 0900 04/25/14 0930 04/25/14 1000 04/25/14 1038  BP: 150/86 162/87 173/94 189/108  Pulse: 87 88 90 98  Temp:    97.3 F (36.3 C)  TempSrc:    Oral  Resp:    20  Weight:    90.2 kg (198 lb 13.7 oz)  SpO2:    98%    Intake/Output Summary (Last 24 hours) at 04/25/14 1401 Last data filed at 04/25/14 1038  Gross per 24 hour  Intake      0 ml  Output   2889 ml  Net  -2889 ml    General: Well developed, well nourished woman lying comfortably in no acute distress Abdomen: soft, nontender, distended, positive fluid wave  Neuro: answering questions appropriately   Lab Results: Basic Metabolic Panel:  Recent Labs Lab 04/20/14 1045 04/24/14 1428 04/25/14 0753  NA 142 142 140  K 5.6* 6.0* 5.9*  CL 101 98 98  CO2 24 23 19   GLUCOSE 83 155* 122*  BUN 62* 83* 89*  CREATININE 11.47* 12.69* 13.18*  CALCIUM 7.0* 7.7* 7.5*  MG 1.9  --   --   PHOS 8.5*  --   --    Liver Function Tests:  Recent Labs Lab 04/24/14 1428 04/25/14 0600  AST 16 11  ALT 15 12  ALKPHOS 151* 142*  BILITOT 0.2* 0.2*  PROT 6.2 5.9*  ALBUMIN 2.3* 2.1*   CBC:  Recent Labs Lab 04/20/14 1045 04/24/14 1428 04/25/14 0753  WBC 7.0 6.9 6.8  NEUTROABS 5.0 5.2  --   HGB 10.0* 10.2* 10.6*  HCT 32.8* 32.7* 33.6*  MCV 91.9 90.8 90.3  PLT 169 196 183   Studies/Results: Dg Chest 2 View  04/24/2014   CLINICAL DATA:  Abdominal pain, shortness of breath, dialysis patient.  EXAM: CHEST  2 VIEW  COMPARISON:  Chest radiograph April 18, 2014  FINDINGS: The cardiac silhouette appears moderately enlarged, similar. Mediastinal silhouette is  nonsuspicious. Slightly increasing central pulmonary vasculature congestion with interstitial prominence. Increasing for suspected right pleural effusion, with the right chest wall pleural thickening. No pneumothorax. Surgical clips in left upper extremity soft tissues. Osseous structures are nonsuspicious.  IMPRESSION: Stable cardiomegaly, worsening central pulmonary vasculature congestion/interstitial edema and apparent increasing right lung base small pleural effusion. Right lower lobe patchy airspace opacity may reflect confluent edema.   Electronically Signed   By: Awilda Metro   On: 04/24/2014 15:09   Medications: I have reviewed the patient's current medications. Scheduled Meds: . calcium acetate  1,334 mg Oral TID WC  . cloNIDine  0.3 mg Oral BID  . heparin  5,000 Units Subcutaneous 3 times per day  . hydrocerin   Topical BID  . sodium chloride  3 mL Intravenous Q12H   Continuous Infusions:  PRN Meds:.sodium chloride, sodium chloride, acetaminophen, acetaminophen, calcium carbonate, feeding supplement (NEPRO CARB STEADY), heparin, heparin, lidocaine (PF), lidocaine-prilocaine, pentafluoroprop-tetrafluoroeth Assessment/Plan: In summary, Ms. Midura is a 43 year old woman with a history of ESRD requiring hemodialysis, hypertension and questionable cardiomyopathy who presented yesterday needing hemodialysis with shortness of breath and abdominal distention.   ESRD:  - Received dialysis this morning for volume overload -  She continues to work with Child psychotherapistsocial worker to establish her with an outpatient hemodialysis center prior to discharge   Abdominal distension: Ascites on exam likely due to inconsistent and delayed hemodialysis - Therapeutic paracentesis will be performed in order to provide symptomatic relief of her abdominal discomfort   HTN: Likely exacerbated by volume overload - Continue on clonidine 0.3 mg BID - Expected improvement following therapeutic paracentesis and with  continued hemodialysis   Hyperkalemia:  - Continued improvement expected with consistent hemodialysis  Long QTc: - Avoid QTc prolonging medications  - Repeat EKG   GERD:  - continue home calcium carbonate 500 mg PRN   DVT Ppx:  - heparin 5,000 U q8hrs   Diet: renal   Dispo: Possible discharge tomorrow depending on response to paracentesis and dialysis.   This is a Psychologist, occupationalMedical Student Note.  The care of the patient was discussed with Dr. Leatha GildingMallory and the assessment and plan formulated with their assistance.  Please see their attached note for official documentation of the daily encounter.   LOS: 1 day   Abundio Miuhristine P Enis Riecke, Med Student 04/25/2014, 2:01 PM

## 2014-04-25 NOTE — Progress Notes (Signed)
Patient ID: Holly Mendoza, female   DOB: 1971-06-16, 43 y.o.   MRN: [id number]  [facility] Progress Note   Assessment/ Plan:   1. End-stage renal disease with volume overload: Undergoing hemodialysis today-this was ordered for yesterday however the patient could not be done due to emergencies arising overnight. Fortunately, the biggest problem is disposition-placement to a local dialysis unit that has been hampered by her "behavioral concerns". She has a case Production designer, theatre/television/film that is working to help her get into a local dialysis unit until then, she is forced to seek emergent hemodialysis in the hospital. 2. Hyperkalemia: Emergent hemodialysis, reeducated on dietary potassium restriction-difficult to control without schedule/regular dialysis  3. Hypertension: Anticipate will improve with hemodialysis, compliance with medications stressed  4. Metabolic bone disease: Reminded her need for compliance with low phosphorus diet and continued adherence with her binders  5. Recurrent ascites: This is due to inadequate hemodialysis, anticipate paracentesis today  Subjective:   Reports to be feeling fair-somewhat disgruntled that she had a bad night due to elevated blood pressures "they refused to give my medications"    Objective:   BP 150/86  Pulse 87  Temp(Src) 97.5 F (36.4 C) (Oral)  Resp 18  Wt 93.622 kg (206 lb 6.4 oz)  SpO2 98%  Physical Exam: Gen: Comfortably resting on dialysis CVS: Pulse regular in rate and rhythm, S1 and S2 normal Resp: Fine rales bibasally, no rhonchi/wheeze Abd: Firm, distended with ascites, nontender Ext: 3+ lower extremity edema with chronic venous stasis dermatitis  Labs: BMET  Recent Labs Lab 04/18/14 1415 04/20/14 1045 04/24/14 1428 04/25/14 0753  NA 138 142 142 140  K 6.4* 5.6* 6.0* 5.9*  CL 95* 101 98 98  CO2 22 24 23 19   GLUCOSE 83 83 155* 122*  BUN 78* 62* 83* 89*  CREATININE 13.68* 11.47* 12.69* 13.18*  CALCIUM 6.9* 7.0* 7.7* 7.5*   PHOS  --  8.5*  --   --    CBC  Recent Labs Lab 04/18/14 1415 04/20/14 1045 04/24/14 1428 04/25/14 0753  WBC 7.4 7.0 6.9 6.8  NEUTROABS 5.2 5.0 5.2  --   HGB 10.7* 10.0* 10.2* 10.6*  HCT 33.3* 32.8* 32.7* 33.6*  MCV 89.5 91.9 90.8 90.3  PLT 197 169 196 183   Medications:    . calcium acetate  1,334 mg Oral TID WC  . cloNIDine  0.3 mg Oral BID  . heparin  5,000 Units Subcutaneous 3 times per day  . hydrocerin   Topical BID  . sodium chloride  3 mL Intravenous Q12H   Zetta Bills, MD 04/25/2014, 9:20 AM

## 2014-04-25 NOTE — Progress Notes (Signed)
  I have seen and examined the patient myself, and I have reviewed the note by Delford Field, MS 3 and was present during the interview and physical exam.  Please see my separate H&P for additional findings, assessment, and plan.   Signed: Dionne Ano, MD 04/25/2014, 8:23 PM

## 2014-04-25 NOTE — Procedures (Signed)
Patient seen on Hemodialysis. QB 400, UF goal 5L Treatment adjusted as needed.  Zetta Bills MD [facility]. Office # [phone] Pager # [phone] 8:55 AM

## 2014-04-26 ENCOUNTER — Encounter (HOSPITAL_COMMUNITY): Payer: Self-pay | Admitting: General Practice

## 2014-04-26 LAB — RENAL FUNCTION PANEL
Albumin: 2.1 g/dL — ABNORMAL LOW (ref 3.5–5.2)
Albumin: 2.1 g/dL — ABNORMAL LOW (ref 3.5–5.2)
Anion gap: 17 — ABNORMAL HIGH (ref 5–15)
Anion gap: 20 — ABNORMAL HIGH (ref 5–15)
BUN: 64 mg/dL — AB (ref 6–23)
BUN: 68 mg/dL — ABNORMAL HIGH (ref 6–23)
CALCIUM: 7.6 mg/dL — AB (ref 8.4–10.5)
CHLORIDE: 99 meq/L (ref 96–112)
CHLORIDE: 98 meq/L (ref 96–112)
CO2: 24 mEq/L (ref 19–32)
CO2: 22 meq/L (ref 19–32)
CREATININE: 10.63 mg/dL — AB (ref 0.50–1.10)
Calcium: 7.7 mg/dL — ABNORMAL LOW (ref 8.4–10.5)
Creatinine, Ser: 10.69 mg/dL — ABNORMAL HIGH (ref 0.50–1.10)
GFR calc Af Amer: 5 mL/min — ABNORMAL LOW (ref >90)
GFR calc Af Amer: 4 mL/min — ABNORMAL LOW (ref >90)
GFR calc non Af Amer: 4 mL/min — ABNORMAL LOW (ref >90)
GFR, EST NON AFRICAN AMERICAN: 4 mL/min — AB (ref >90)
GLUCOSE: 85 mg/dL (ref 70–99)
Glucose, Bld: 114 mg/dL — ABNORMAL HIGH (ref 70–99)
POTASSIUM: 5.3 meq/L (ref 3.7–5.3)
Phosphorus: 8.3 mg/dL — ABNORMAL HIGH (ref 2.3–4.6)
Phosphorus: 7.7 mg/dL — ABNORMAL HIGH (ref 2.3–4.6)
Potassium: 5.6 mEq/L — ABNORMAL HIGH (ref 3.7–5.3)
Sodium: 140 mEq/L (ref 137–147)
Sodium: 140 mEq/L (ref 137–147)

## 2014-04-26 LAB — MAGNESIUM: MAGNESIUM: 2 mg/dL (ref 1.5–2.5)

## 2014-04-26 LAB — CBC
HCT: 29.9 % — ABNORMAL LOW (ref 36.0–46.0)
HEMOGLOBIN: 9.4 g/dL — AB (ref 12.0–15.0)
MCH: 28.2 pg (ref 26.0–34.0)
MCHC: 31.4 g/dL (ref 30.0–36.0)
MCV: 89.8 fL (ref 78.0–100.0)
PLATELETS: 167 10*3/uL (ref 150–400)
RBC: 3.33 MIL/uL — AB (ref 3.87–5.11)
RDW: 14.2 % (ref 11.5–15.5)
WBC: 6.1 10*3/uL (ref 4.0–10.5)

## 2014-04-26 LAB — PATHOLOGIST SMEAR REVIEW: PATH REVIEW: REACTIVE

## 2014-04-26 NOTE — Progress Notes (Signed)
Subjective: Patient states that she rested some overnight but did not sleep that well because she was feeling overwhelmed from the procedures during the day. She is feeling much better after the dialysis and paracentesis yesterday. No shortness of breath or abdominal discomfort. She felt nausea overnight but had no true vomiting. She feels like she needs an additional round of dialysis today before she goes home.   Objective: Vital signs in last 24 hours: Filed Vitals:   04/25/14 1315 04/25/14 1900 04/25/14 2132 04/26/14 0428  BP: 170/87 160/80 164/100 154/92  Pulse:  94 98 82  Temp:  98.1 F (36.7 C) 99.1 F (37.3 C) 98.3 F (36.8 C)  TempSrc:  Oral Oral Oral  Resp:  20 18 18   Weight:   79.9 kg (176 lb 2.4 oz)   SpO2:  98% 92% 92%   Weight change: -3.422 kg (-7 lb 8.7 oz)  Intake/Output Summary (Last 24 hours) at 04/26/14 0725 Last data filed at 04/25/14 1901  Gross per 24 hour  Intake    360 ml  Output   2889 ml  Net  -2529 ml   General: well developed woman, resting calmly in bed  HEENT: periorbital edema Cardiac: regular rate, mild tachycardia, flow murmur at sternal border, JVD to jaw line  Lungs: CTAB, no crackles or wheezes  Abdomen: distended (decreased from yesterday), non painful reducible umbilical hernia (decreased from yesterday), +BS, no tenderness to palpation  Extremities: no LE edema, thick darkened skin consistent with chronic venous stasis on bilateral LE Psychiatric: normal affect, slow to answer questions Access in left groin, strong thrill, clear bruit   Lab Results: Basic Metabolic Panel:  Recent Labs Lab 04/20/14 1045 04/24/14 1428 04/25/14 0753  NA 142 142 140  K 5.6* 6.0* 5.9*  CL 101 98 98  CO2 24 23 19   GLUCOSE 83 155* 122*  BUN 62* 83* 89*  CREATININE 11.47* 12.69* 13.18*  CALCIUM 7.0* 7.7* 7.5*  MG 1.9  --   --   PHOS 8.5*  --   --    Liver Function Tests:  Recent Labs Lab 04/24/14 1428 04/25/14 0600  AST 16 11  ALT 15 12    ALKPHOS 151* 142*  BILITOT 0.2* 0.2*  PROT 6.2 5.9*  ALBUMIN 2.3* 2.1*   CBC:  Recent Labs Lab 04/20/14 1045 04/24/14 1428 04/25/14 0753  WBC 7.0 6.9 6.8  NEUTROABS 5.0 5.2  --   HGB 10.0* 10.2* 10.6*  HCT 32.8* 32.7* 33.6*  MCV 91.9 90.8 90.3  PLT 169 196 183   Hemoglobin A1C:  Recent Labs Lab 04/25/14 0600  HGBA1C 5.2   Studies/Results: Dg Chest 2 View  04/24/2014   CLINICAL DATA:  Abdominal pain, shortness of breath, dialysis patient.  EXAM: CHEST  2 VIEW  COMPARISON:  Chest radiograph April 18, 2014  FINDINGS: The cardiac silhouette appears moderately enlarged, similar. Mediastinal silhouette is nonsuspicious. Slightly increasing central pulmonary vasculature congestion with interstitial prominence. Increasing for suspected right pleural effusion, with the right chest wall pleural thickening. No pneumothorax. Surgical clips in left upper extremity soft tissues. Osseous structures are nonsuspicious.  IMPRESSION: Stable cardiomegaly, worsening central pulmonary vasculature congestion/interstitial edema and apparent increasing right lung base small pleural effusion. Right lower lobe patchy airspace opacity may reflect confluent edema.   Electronically Signed   By: Awilda Metro   On: 04/24/2014 15:09   Medications: I have reviewed the patient's current medications. Scheduled Meds: . calcium acetate  1,334 mg Oral TID WC  .  cloNIDine  0.3 mg Oral BID  . heparin  5,000 Units Subcutaneous 3 times per day  . hydrocerin   Topical BID  . sodium chloride  3 mL Intravenous Q12H   Continuous Infusions:  PRN Meds:.sodium chloride, sodium chloride, acetaminophen, acetaminophen, calcium carbonate, feeding supplement (NEPRO CARB STEADY), heparin, heparin, lidocaine (PF), lidocaine-prilocaine, pentafluoroprop-tetrafluoroeth, promethazine Assessment/Plan:  [name] is a 43 year old woman with history of hypertension and ESRD requiring hemodialysis and weekly paracentesis who  recently moved from [place] with no local plan for hemodialysis. Most important issue to resolve is HD placement to avoid recurrent hospitalizations.   Volume overload in setting of ESRD: Improved volume status and symptoms after 5L removed during HD and 4L removed by paracentesis yesterday.  - Get BMET to check electrolytes and renal function to assess need for HD today. If no need, discharge today - Continue to work on obtaining records from her medical care providers in [place]  - She is working with a Child psychotherapistsocial worker to be placed in an outpatient HD clinic. Until then she will be [facility] to receive HD and paracentesis   Abdominal distention: Decreased abdominal distention and discomfort and regression of umbilical hernia s/p paracentesis. Given her transparent yellow peritoneal fluid with WBC <500 at 143, SAAG > 1.1 at 1.2, and total protein <2.5 at 2.1, uncomplicated ascites in cirrhosis is most likely etiology. However, no clear cause of cirrhosis in this patient.  - Continue to work on obtaining records from [facility] where she had been receiving weekly paracentesis prior to moving  - Consider U/S or liver biopsy if cause is not verified with records  Hypertension: BP is better controlled after dialysis and paracentesis yesterday but still at 154/92.  - Continue home clonidine 0.3 mg BID - Educate patient on importance of compliance with this medication  Nausea: - Phenergan PRN  GERD:  - Continue home calcium carbonate 500 mg PRN  DVT prophylaxis:  - Heparin 5,000 U q 8 hrs  Diet:  - Renal   Dispo: Planned discharge today   This is a Psychologist, occupationalMedical Student Note.  The care of the patient was discussed with Dr. Leatha GildingMallory and the assessment and plan formulated with their assistance.  Please see their attached note for official documentation of the daily encounter.   LOS: 2 days   Abundio Miuhristine P Rachell Druckenmiller, Med Student 04/26/2014, 7:25 AM

## 2014-04-26 NOTE — Procedures (Signed)
I was present at this session.  I have reviewed the session itself and made appropriate changes.  Tolerating high vol UF.  BP staying up.  Deaysia Grigoryan L 7/9/20156:58 PM

## 2014-04-26 NOTE — Progress Notes (Signed)
PROGRESS NOTE MEDICINE TEACHING ATTENDING   Day 2 of stay Patient name: Holly Mendoza   Medical record number: [id number] Date of birth: [birth year]   Met with and evaluated Ms Leinbach this morning on team rounds. She appears clinically improved. Her vitals are stable. She is ambulating with ease. She is status post paracentesis and is doing well.   She is doing well with HD, however, there have been problems in finding an outpatient HD center for her. Per nephrology note, she would be seeking emergency dialysis at ER until she finds a stable center for which a case worker is helping her.   She will be discharged with arrangements for follow up with a PCP made by my team. She needs steady outpatient based medication adherence education to maintain hypertension control - her BP is chronically elevated and will be controlled only when she is compliant with her medications.    I discussed this with Drs Sherrine Maples and Eula Fried. Dr Sandi Raveling note has details of management.    [place], [place] 04/26/2014, 1:30 PM.

## 2014-04-26 NOTE — Progress Notes (Signed)
Subjective: Patient is feeling better after dialysis and paracentesis. No difficulty breathing, but some nausea overnight. She prefers not to treat the nausea and "had no true vomiting (just a spit up a small amount)". Feels like she needs one more round of dialysis and then will be ready for discharge.  Objective: Vital signs in last 24 hours: Filed Vitals:   04/25/14 1315 04/25/14 1900 04/25/14 2132 04/26/14 0428  BP: 170/87 160/80 164/100 154/92  Pulse:  94 98 82  Temp:  98.1 F (36.7 C) 99.1 F (37.3 C) 98.3 F (36.8 C)  TempSrc:  Oral Oral Oral  Resp:  20 18 18   Weight:   176 lb 2.4 oz (79.9 kg)   SpO2:  98% 92% 92%   Weight change: -7 lb 8.7 oz (-3.422 kg)  Intake/Output Summary (Last 24 hours) at 04/26/14 0754 Last data filed at 04/25/14 1901  Gross per 24 hour  Intake    360 ml  Output   2889 ml  Net  -2529 ml   General: well developed, resting in bed on her side HEENT: periorbital edema, JVP extends to jaw  Cardiac: regular rate, tachycardic, flow murmur, no R/G  Lungs: CTAB, no W/R/R  Abdomen: +BS, reducible, nonpainful umbilical hernia (down from yesterday), distended, but down from yesterday with subtle fluid wave  Extremities: no obvious LE edema, scratch marks on LE but no obvious rash, thick skin with darkening consistent with chronic venous stasis  Neurological: CN II-XII intact  Psychiatric: normal affect, no cognitive slowing Access in left groin, strong thrill, clear bruit  Lab Results: Na 140 K 5.9 Cl 98 CO2 19 BUN 89 Cr 13.18 Ca 7.5 AG 23  WBC 6.8 H&H 10.6/33.6 Plt 183 HgbA1c 5.5 HIV negative  Alk phos: 142 Albumin 2.1 AST: 11 ALT: 12 Tot Protein: 5.9 Bili direct less than 0.2 Total bili 0.2  Peritoneal fluid from chemistry: Albumin 0.9 Protein 2.1  Studies/Results: Dg Chest 2 View  04/24/2014   CLINICAL DATA:  Abdominal pain, shortness of breath, dialysis patient.  EXAM: CHEST  2 VIEW  COMPARISON:  Chest radiograph April 18, 2014  FINDINGS: The cardiac silhouette appears moderately enlarged, similar. Mediastinal silhouette is nonsuspicious. Slightly increasing central pulmonary vasculature congestion with interstitial prominence. Increasing for suspected right pleural effusion, with the right chest wall pleural thickening. No pneumothorax. Surgical clips in left upper extremity soft tissues. Osseous structures are nonsuspicious.  IMPRESSION: Stable cardiomegaly, worsening central pulmonary vasculature congestion/interstitial edema and apparent increasing right lung base small pleural effusion. Right lower lobe patchy airspace opacity may reflect confluent edema.   Electronically Signed   By: Elon Alas   On: 04/24/2014 15:09   Medications: I have reviewed the patient's current medications. Scheduled Meds: . calcium acetate  1,334 mg Oral TID WC  . cloNIDine  0.3 mg Oral BID  . heparin  5,000 Units Subcutaneous 3 times per day  . hydrocerin   Topical BID  . sodium chloride  3 mL Intravenous Q12H   Continuous Infusions:  PRN Meds:.sodium chloride, sodium chloride, acetaminophen, acetaminophen, calcium carbonate, feeding supplement (NEPRO CARB STEADY), heparin, heparin, lidocaine (PF), lidocaine-prilocaine, pentafluoroprop-tetrafluoroeth, promethazine Assessment/Plan: Principal Problem:   Volume overload Active Problems:   Hyperkalemia   ESRD on hemodialysis   Hx of ascites   HTN (hypertension)   Prolonged Q-T interval on ECG  43 year old woman with ESRD who has been requiring HD and weekly paracentesis, here for both of these treatments. Biggest issue now is HD placement  going forward, to avoid similar hospitalizations.  ESRD: still attempting to obtain records to get a better sense for the origins of her disease and her transplant history. Received HD this morning (UF 5L removed) and paracentesis this afternoon (4L removed) - continue home calcium acetate 1334 mg TID with meals   Abdominal distension:  paracentesis completed with large decrease in distension and regression of umbilical hernia - obtain records from [facility] - hepatic function significant for high alk phos, low albumin and total protein  - serum albumin 2.1; ascites albumin 0.9. SAAG is 1.20 gm/dL, consistent with portal hypertension, CHF or Budd-Chiari syndrome  ?Cardiomegaly: in history and moderately enlarged on CXR  - consider echo to re-evaluate this finding  - obtain records from [place] QTc:  - avoid zofran - consider repeating EKG after dialysis   Hypertension:  - restart home clonidine 0.3 mg BID; consider different BP regimen as outpatient, considering patient's tendency to skip doses and risk of rebound hypertension  - BP better controlled after procedures, but still running high 154/92, consider adding lopressor  GERD:  - continue home calcium carbonate 500 mg PRN   Nausea:  - phenergan PRN  DVT Ppx:  - heparin 5,000 U q8hrs   Diet: renal  Dispo: Disposition is deferred at this time, awaiting improvement of current medical problems.  Anticipated discharge in approximately 0 day(s).   The patient does not have a current PCP (No Pcp Per Patient) and does need an [facility] hospital follow-up appointment after discharge.  The patient does not have transportation limitations that hinder transportation to clinic appointments.  .Services Needed at time of discharge: Y = Yes, Blank = No PT:   OT:   RN:   Equipment:   Other:     LOS: 2 days   Drucilla Schmidt, MD 04/26/2014, 7:54 AM

## 2014-04-26 NOTE — Care Management Note (Addendum)
CARE MANAGEMENT NOTE 04/26/2014  Patient:  Holly Mendoza, Holly Mendoza   Account Number:  [id number]  Date Initiated:  04/26/2014  Documentation initiated by:  Natalina Wieting  Subjective/Objective Assessment:     Action/Plan:   Noted that pt is currently without HD center, and attempts to place at an outpt hemodialysis center are ongoing. Met with pt 04/26/14 and attempting to setup outpt followup at [facility] and [facility]. Pt also need SCAT application,   Anticipated DC Date:  04/26/2014   Anticipated DC Plan:  HOME/SELF CARE         Choice offered to / List presented to:             Status of service:  Completed, signed off Medicare Important Message given?  yes (If response is "NO", the following Medicare IM given date fields will be blank) Date Medicare IM given:  04/26/2014 Medicare IM given by:  Jasmine Pang RN MPH, case manager, [phone] Date Additional Medicare IM given:   Additional Medicare IM given by:    Discharge Disposition:  HOME/SELF CARE  Per UR Regulation:    If discussed at Long Length of Stay Meetings, dates discussed:    Comments:  04/26/2014 Ongoing efforts to schedule a followup appointment at [facility] and Wellness center, pt given number to continue to call and schedule an appointment. CSW will provide pt with an application for SCAT transportation. Bethena Roys with the dialysis center still attempting to setup outpatient hemodialysis for this pt. CRoyal RN MPH, case manager, [phone]  Addem: Pt able to reach [facility] and [facility], and was told that they were not currently accepting patients. Pt provide with info on [facility] and given the number for [facility] as she has insurance and may be able to schedule with a local MD accepting new patients.  CRoyal RN MPH, case Freight forwarder

## 2014-04-26 NOTE — Discharge Summary (Signed)
Name: Holly Mendoza MRN: [id number] DOB: 06/14/1971 43 y.o. PCP: No Pcp Per Patient  Date of Admission: 04/24/2014  1:53 PM Date of Discharge: 04/26/2014 Attending Physician: Rocco SereneLawrence D Klima, MD  Discharge Diagnosis: Principal Problem:   Volume overload Active Problems:   Hyperkalemia   ESRD on hemodialysis   Hx of ascites   HTN (hypertension)   Prolonged Q-T interval on ECG  Discharge Medications:   Medication List    ASK your doctor about these medications       calcium acetate 667 MG capsule  Commonly known as:  PHOSLO  Take 1,334 mg by mouth 3 (three) times daily with meals.     calcium carbonate 500 MG chewable tablet  Commonly known as:  TUMS - dosed in mg elemental calcium  Chew 1-2 tablets by mouth daily as needed for indigestion or heartburn.     cloNIDine 0.3 MG tablet  Commonly known as:  CATAPRES  Take 0.3 mg by mouth 2 (two) times daily.        Disposition and follow-up:   Holly Mendoza was discharged from [facility] in Stable condition.  At the hospital follow up visit please address:  1.  Patient does not have a regular HD center or a plan for her weekly therapeutic paracentesis. For now, plan is to continue to get HD in ER PRN. The patient has a history of behavior issues at HD and a history of noncompliance, and despite many efforts, social work could not secure dialysis placement for her at this time. Also, patient is on Clonidine and occasionally skips it. Because this medication can cause rebound hypertension, perhaps consider a medication switch for BP control. ?Long QTc was seen on the patient's EKG prior to HD; monitor this.  2.  Labs / imaging needed at time of follow-up: none  3.  Pending labs/ test needing follow-up: EKG  Follow-up Appointments: [facility] for [facility] (patient needs to call) 1x appointment with Dr. Evelena PeatAlex Wilson, [address], [place], [place]  Discharge Instructions: Follow up and  continue HD at [facility] ED PRN  Consultations: Treatment Team:  Hartley BarefootJay K. Allena KatzPatel, MD  Procedures Performed:  Dg Chest 2 View  04/24/2014   CLINICAL DATA:  Abdominal pain, shortness of breath, dialysis patient.  EXAM: CHEST  2 VIEW  COMPARISON:  Chest radiograph April 18, 2014  FINDINGS: The cardiac silhouette appears moderately enlarged, similar. Mediastinal silhouette is nonsuspicious. Slightly increasing central pulmonary vasculature congestion with interstitial prominence. Increasing for suspected right pleural effusion, with the right chest wall pleural thickening. No pneumothorax. Surgical clips in left upper extremity soft tissues. Osseous structures are nonsuspicious.  IMPRESSION: Stable cardiomegaly, worsening central pulmonary vasculature congestion/interstitial edema and apparent increasing right lung base small pleural effusion. Right lower lobe patchy airspace opacity may reflect confluent edema.   Electronically Signed   By: Awilda Metroourtnay  Bloomer   On: 04/24/2014 15:09   Dg Chest 2 View  04/14/2014   CLINICAL DATA:  Edema  EXAM: CHEST  2 VIEW  COMPARISON:  None.  FINDINGS: Moderate cardiomegaly. Normal vascularity. Small left pleural effusion with basilar atelectasis. Hazy opacity at the right base with elevation of the right hemidiaphragm likely reflects a combination of pleural effusion and airspace disease. Linear atelectasis at the base of the right upper lobe.  IMPRESSION: Cardiomegaly without convincing evidence of pulmonary edema.  Small pleural effusions.  Right basilar hazy airspace disease is suspected.   Electronically Signed   By: Lenoria FarrierArt  Hoss M.D.   On: 04/14/2014 13:42   Dg Chest Port 1 View  04/18/2014   CLINICAL DATA:  Shortness of breath.  EXAM: PORTABLE CHEST - 1 VIEW  COMPARISON:  04/14/2014  FINDINGS: Cardiac silhouette is enlarged. No mediastinal or hilar masses. Reticular opacity lies in the right upper lobe inferiorly and laterally adjacent to the minor fissure, stable. There is hazy  right lung base opacity accentuated by positioning. This may reflect a small effusion with atelectasis. Is similar to the prior exam. There is vascular prominence without overt pulmonary edema. No lung consolidation is seen. There is no pneumothorax. Bony thorax is demineralized but grossly intact.  IMPRESSION: No convincing acute cardiopulmonary disease. There is cardiomegaly and vascular prominent, but no overt edema. Probable small right effusion with associated atelectasis.   Electronically Signed   By: Amie Portland M.D.   On: 04/18/2014 17:11    Admission HPI: Holly Mendoza is a 43 yo woman with a history significant for ESRD (on dialysis since 1996, T, R, Sa) and possible cardiomyopathy. As per her report, she had an unspecified diagnosis of hypertension before she started hemodialysis, then eventually underwent kidney transplant and rejection. She recently moved from [place] to [place] and has been unable to find placement in a dialysis center here (due to a history of some behavioral problems such as stopping dialysis early). Since arriving in [place] several weeks ago, she has received dialysis here at [facility] on 6/27 7/1 and 7/3. Furthermore, the patient receives supplemental paracentesis once per week for volume overload. Today, she felt like "she could not breath, due to the fluid in my belly". She also is photophobic and fatigued. Of note, she did not take her home Clonidine this morning.   Hospital Course by problem list: Principal Problem:   Volume overload Active Problems:   Hyperkalemia   ESRD on hemodialysis   Hx of ascites   HTN (hypertension)   Prolonged Q-T interval on ECG   In summary, Holly Mendoza is a 43 yo woman with a history of ESRD (of unknown origin) who has been requiring HD and weekly paracenteses to manage her volume overload. She previously received care in [place], but has moved to [place] without HD care and has not been on a regular HD schedule here. She  arrived to our ED with signs of volume overload (shortness of breath and abdominal discomfort), here for HD and paracentesis.   ESRD: we attained some records from one of Holly Mendoza's [employer], [place]. They did not specify the origin of her ESRD, and the patient had been receiveing her paracenteses at [facility], so we continue to have no records of that other than the patient's report. While in our care, the patient received HD 2x with some resolution of her symptoms. She also received paracentesis (4L removed laterally). SAAG was calculated at 2.1. Of note, Dr. Allena Katz was able to procure some of her HD records, in which her former nephrologists state that while 4hr 3x week HD would be adequate to treat her, the patient's self-proclaimed 2 hr 2x/wk schedule of dialysis was simply not enough to treat her volume overload. Patient noncompliance truly seems to be contributing to the management of her disease.  ?Cardiomegaly: history of cardiomegaly and ?cardiomyopathy, but no echo was performed on this admission  ?Long QTc: noted prior to dialysis, worth future investigation and avoidance of medications with tendency to prolong QTc  Hypertension: we restarted the patient's home clonidin 0.3 mg BID; consider other regimen as  outpatient given patient's tendency to skip doses  GERD: continued patient's home calcium carbonate   Discharge Vitals:   BP 186/103  Pulse 93  Temp(Src) 97.4 F (36.3 C) (Oral)  Resp 22  Wt 176 lb 2.4 oz (79.9 kg)  SpO2 96%  Discharge Labs:  Results for orders placed during the hospital encounter of 04/24/14 (from the past 24 hour(s))  BODY FLUID CELL COUNT WITH DIFFERENTIAL     Status: None   Collection Time    04/25/14  6:07 PM      Result Value Ref Range   Fluid Type-FCT ASCITIC     Color, Fluid YELLOW  YELLOW   Appearance, Fluid CLEAR  CLEAR   WBC, Fluid 143  0 - 1000 cu mm   Neutrophil Count, Fluid 0  0 - 25 %   Lymphs, Fluid 48      Monocyte-Macrophage-Serous Fluid 52  50 - 90 %   Eos, Fluid 0     Other Cells, Fluid 0    ALBUMIN, FLUID     Status: None   Collection Time    04/25/14  6:07 PM      Result Value Ref Range   Albumin, Fluid 0.9     Fluid Type-FALB ASCITIC    PROTEIN, BODY FLUID     Status: None   Collection Time    04/25/14  6:07 PM      Result Value Ref Range   Total protein, fluid 2.1     Fluid Type-FTP ASCITIC      Signed: Dionne Ano, MD 04/26/2014, 11:46 AM    Services Ordered on Discharge: none Equipment Ordered on Discharge: none

## 2014-04-26 NOTE — Discharge Instructions (Signed)
Please continue to come to the Emergency Department for dialysis until a dialysis center placement is found for you.   Hemodialysis Care After These instructions provide you with information on caring for yourself after your hemodialysis session. Your caregiver may also give you more specific instructions. Your treatment has been planned according to current medical practices, but problems sometimes occur. Call your caregiver if you have any problems or questions after your procedure. HOME CARE INSTRUCTIONS  General Instructions   Take medications as directed by your caregiver.  Keep all dialysis appointments. Dialysis appointments should be scheduled regularly. Do not skip an appointment.  Carry a wallet card, bracelet, or medical identification tag that shows you are a dialysis patient. Always keep it with you. If you are in an accident or have a medical emergency and cannot communicate, this item will inform caregivers about your condition. Diet Instructions  Talk to your caregiver about how much fluid you can consume. Keep track of your fluid intake to make sure you do not consume more fluid than is allowed. This is important because fluid can build up in your body between dialysis sessions. Built-up fluid affects your blood pressure and can make your heart work harder.  Limit your sodium intake. This is important because sodium makes you thirsty and the amount of fluid you can drink every day is limited.  Limit your intake of the mineral potassium. Potassium levels can rise between dialysis sessions. If they become too high, they can cause a dangerous heart rhythm and even death.  Limit your intake of the mineral phosphorus. Consuming too much phosphorus can cause your bones to lose calcium. This makes them weaker and more likely to break.  Eat high-quality proteins that are low in phosphorus. High-quality proteins include those found in fish, poultry, and eggs.  Take vitamin and  mineral supplements as directed by your caregiver.  Talk with your caregiver before taking new supplements. Vascular Access Instructions  Avoid sleeping on your vascular access.  If your vascular access is on your arm:  Do not lift weights or heavy objects with that arm.  Do not wear clothing that is tight over that arm.  Do not have your blood pressure measured on that arm.  Do notallow needle punctures (such as for taking blood) on that arm.  If you have a catheter, do not open it between dialysis sessions.  If you have a fistula or graft, wash it with antibacterial soap every day and before each dialysis session.  If you have a fistula or graft, feel for a vibration over it (thrill) every day. A thrill means the access is working. SEEK MEDICAL CARE IF:  You have a fever.  You have chills.  Your vascular access feels warm.  You find a pimple on your vascular access.  There is swelling or redness around the vascular access site.  There is drainage or bleeding at the vascular access site. SEEK IMMEDIATE MEDICAL CARE IF:  You have a fistula and pain, numbness, or unusual paleness develops in the hand on the side of your fistula.  You have a fistula or graft and do not feel a thrill.  You develop dizziness or weakness that you have not had before.  You develop shortness of breath.  You develop chest pain.  There is pus at the vascular access site.  There is bleeding at the vascular access site that cannot be easily controlled. Document Released: 01/30/2013 Document Reviewed: 01/30/2013 [facility] Patient Information 2015 [place], [place].  This information is not intended to replace advice given to you by your health care provider. Make sure you discuss any questions you have with your health care provider. ° °

## 2014-04-26 NOTE — Progress Notes (Signed)
  I have seen and examined the patient myself, and I have reviewed the note by Delford Field, MS 3 and was present during the interview and physical exam.  Please see my separate H&P for additional findings, assessment, and plan.   Signed: Dionne Ano, MD 04/26/2014, 6:13 PM

## 2014-04-26 NOTE — Progress Notes (Signed)
Patient ID: Holly Mendoza, female   DOB: 1971-02-20, 43 y.o.   MRN: [id number]  [facility] Progress Note   Assessment/ Plan:   1. End-stage renal disease with volume overload: Plan for hemodialysis today-she wanted this to be only ultrafiltration rather than hemodialysis but I informed her that we would not be able to do this. Unfortunately, the biggest problem is disposition-placement to a local dialysis unit that has been hampered by her "behavioral concerns". She has a case Production designer, theatre/television/film that is working to help her get into a local dialysis unit (?[place], [place]) until then, she is forced to seek emergent hemodialysis in the hospital.  2. Hyperkalemia: Hemodialysis done yesterday, labs again today  3. Hypertension: Anticipate will improve with hemodialysis, compliance with medications stressed  4. Metabolic bone disease: Reminded her need for compliance with low phosphorus diet and continued adherence with her binders  5. Recurrent ascites: This is due to inadequate hemodialysis, status post paracentesis   Subjective:   Reports to be feeling fair-awaiting dialysis. She is convinced that Naturalyte dialysate is the source of all her problems    Objective:   BP 186/103  Pulse 93  Temp(Src) 97.4 F (36.3 C) (Oral)  Resp 22  Wt 79.9 kg (176 lb 2.4 oz)  SpO2 96%  Physical Exam: Gen: Comfortably sitting up in her bed, talking to case manager CVS: Pulse regular in rate and rhythm, S1 and S2 normal-no gallop or rub Resp: Decreased breath sounds over bases otherwise clear Abd: Soft, obese, nontender Ext: Chronic venous stasis dermatitis-1+ lower extremity edema  Labs: BMET  Recent Labs Lab 04/20/14 1045 04/24/14 1428 04/25/14 0753  NA 142 142 140  K 5.6* 6.0* 5.9*  CL 101 98 98  CO2 24 23 19   GLUCOSE 83 155* 122*  BUN 62* 83* 89*  CREATININE 11.47* 12.69* 13.18*  CALCIUM 7.0* 7.7* 7.5*  PHOS 8.5*  --   --    CBC  Recent Labs Lab 04/20/14 1045 04/24/14 1428  04/25/14 0753  WBC 7.0 6.9 6.8  NEUTROABS 5.0 5.2  --   HGB 10.0* 10.2* 10.6*  HCT 32.8* 32.7* 33.6*  MCV 91.9 90.8 90.3  PLT 169 196 183   Medications:    . calcium acetate  1,334 mg Oral TID WC  . cloNIDine  0.3 mg Oral BID  . heparin  5,000 Units Subcutaneous 3 times per day  . hydrocerin   Topical BID  . sodium chloride  3 mL Intravenous Q12H   Zetta Bills, MD 04/26/2014, 11:57 AM

## 2014-04-27 MED ORDER — CALCIUM ACETATE 667 MG PO CAPS: 1334.0000 mg | ORAL_CAPSULE | Freq: Three times a day (TID) | ORAL | Status: DC

## 2014-04-27 NOTE — Progress Notes (Signed)
Internal Medicine Attending  Date: 04/27/2014  Patient name: Holly Mendoza Medical record number: [id number] Date of birth: [birth year] Age: 43 y.o. Gender: female  I saw and evaluated the patient. I developed the assessment and plan with the housestaff and reviewed the resident's note by Dr. Leatha Gilding.  I agree with the resident's findings and plans as documented in her progress note.  Holly Mendoza was seen on rounds this morning and was feeling well. Abdominal examination revealed less distention than prior to her paracentesis. Her abdomen was soft, and nontender. She is stable for discharge and primary care will be provided in the community although she may require one followup appointment in the internal medicine clinic until she is established in the community.

## 2014-04-27 NOTE — Progress Notes (Signed)
Patient w/ nausea and weakness s/p HD. Planned for d/c today, however, patient not feeling well enough to go home tonight. Will stay until the AM. Discussed w/ RN. Patient currently w/ no nausea. Plan for d/c tomorrow.  Signed: Lars Masson, MD 04/27/2014 1:02 AM

## 2014-04-27 NOTE — Progress Notes (Signed)
Pt returned to rm from hemodialysis ~2150. Initially c/o N/V, weakness, and shakiness. Pt thinks it's related to amt of fluid removed. Currently pt states all sx resolved except weakness, but given the time and situation, pt requests to remain in-house overnight and dc as ordered in the morning. Pt has notified family for pick-up. Paged and notified Dr Yetta Barre, pgr [phone]...Marland Kitchen Marvia Pickles, RN

## 2014-04-27 NOTE — Progress Notes (Signed)
Subjective: Patient feels much better today; no more nausea. She called her family to arrange for pickup this morning.  Patient was set up for discharge yesterday after hemodialysis (her second in this hospitalization), but requested to stay overnight when she experienced shakiness, nausea and weakness; it was also very late when her HD ended. She did not request or receive any medication for her symptoms.  Objective: Vital signs in last 24 hours: Filed Vitals:   04/26/14 2030 04/26/14 2053 04/26/14 2150 04/27/14 0500  BP: 157/97 172/96 184/103 169/96  Pulse: 81 84 90 82  Temp:  97.7 F (36.5 C) 98.9 F (37.2 C) 97.9 F (36.6 C)  TempSrc:  Oral Oral Oral  Resp:  20 18 18   Weight:  176 lb 9.4 oz (80.1 kg) 176 lb 9.4 oz (80.1 kg)   SpO2:  96% 94% 96%   Weight change: -6 lb 6.3 oz (-2.9 kg)  Intake/Output Summary (Last 24 hours) at 04/27/14 9767 Last data filed at 04/26/14 2053  Gross per 24 hour  Intake    240 ml  Output   6000 ml  Net  -5760 ml   General: well developed HEENT: periorbital edema, JVP extends to jaw  Cardiac: regular rate, tachycardic, flow murmur, no R/G  Lungs: CTAB, no W/R/R  Abdomen: +BS, reducible, nonpainful umbilical hernia (down from admission), distended, but down from admission with subtle fluid wave  Extremities: no obvious LE edema, scratch marks on LE but no obvious rash, thick skin with darkening consistent with chronic venous stasis  Neurological: CN II-XII intact  Psychiatric: normal affect, no cognitive slowing Access in left groin, strong thrill, clear bruit  Lab Results: No new labs Na 140 K 5.9 Cl 98 CO2 19 BUN 89 Cr 13.18 Ca 7.5 AG 23  WBC 6.8 H&H 10.6/33.6 Plt 183 HgbA1c 5.5 HIV negative  Alk phos: 142 Albumin 2.1 AST: 11 ALT: 12 Tot Protein: 5.9 Bili direct less than 0.2 Total bili 0.2  Peritoneal fluid from chemistry: Albumin 0.9 Protein 2.1  Studies/Results: No results found. Medications: I have reviewed  the patient's current medications. Scheduled Meds: . calcium acetate  1,334 mg Oral TID WC  . cloNIDine  0.3 mg Oral BID  . heparin  5,000 Units Subcutaneous 3 times per day  . hydrocerin   Topical BID  . sodium chloride  3 mL Intravenous Q12H   Continuous Infusions:  PRN Meds:.sodium chloride, sodium chloride, acetaminophen, acetaminophen, calcium carbonate, feeding supplement (NEPRO CARB STEADY), heparin, heparin, lidocaine (PF), lidocaine-prilocaine, pentafluoroprop-tetrafluoroeth, promethazine Assessment/Plan: Principal Problem:   Volume overload Active Problems:   Hyperkalemia   ESRD on hemodialysis   Hx of ascites   HTN (hypertension)   Prolonged Q-T interval on ECG  43 year old woman with ESRD who has been requiring HD and weekly paracentesis, here for both of these treatments. Biggest issue now is HD placement going forward, to avoid similar hospitalizations.   ESRD: still attempting to obtain records to get a better sense for the origins of her disease and her transplant history. Received HD (UF 5L removed) and paracentesis (4L removed) two days ago and HD again last night. Treatment ended late and patient experienced some nausea, so requested to stay overnight.  - continue home calcium acetate 1334 mg TID with meals   Abdominal distension: paracentesis completed with large decrease in distension and regression of umbilical hernia - obtain records from [facility] - hepatic function significant for high alk phos, low albumin and total protein  -  serum albumin 2.1; ascites albumin 0.9. SAAG is 1.20 gm/dL, consistent with portal hypertension, CHF or Budd-Chiari syndrome  ?Cardiomegaly: in history and moderately enlarged on CXR  - consider echo at later date to re-evaluate this finding; HD records suggest that the poorly managed fluid overload is contributing to her cardiac findings - obtain records from [facility] QTc:  - avoid zofran -  consider repeating EKG after dialysis   Hypertension:  - restart home clonidine 0.3 mg BID; consider different BP regimen as outpatient, considering patient's tendency to skip doses and risk of rebound hypertension  - BP better controlled that on admission, but still running high 150s to 160s, consider adding lopressor  GERD:  - continue home calcium carbonate 500 mg PRN   Nausea:  - phenergan PRN (patient has not been using this)  DVT Ppx:  - heparin 5,000 U q8hrs   Diet: renal  Dispo: Disposition is deferred at this time, awaiting improvement of current medical problems.  Anticipated discharge in approximately 0 day(s).   The patient does not have a current PCP (No Pcp Per Patient) and does need an [facility] follow-up appointment after discharge.  The patient does not have transportation limitations that hinder transportation to clinic appointments.  .Services Needed at time of discharge: Y = Yes, Blank = No PT:   OT:   RN:   Equipment:   Other:     LOS: 3 days   Drucilla Schmidt, MD 04/27/2014, 6:28 AM

## 2014-04-27 NOTE — Progress Notes (Signed)
Patient discharge teaching given, including activity, diet, follow-up appoints, and medications. Patient verbalized understanding of all discharge instructions. IV access was d/c'd. Vitals are stable. Skin is intact except as charted in most recent assessments. Pt walked self out, to be driven home by family.  Pt. Understood that she need to return to the Emergency Department for her hemodialysis treatments, until a center can be found.  Peri Maris, MBA, BS, RN

## 2014-04-30 ENCOUNTER — Inpatient Hospital Stay (HOSPITAL_COMMUNITY)
Admission: EM | Admit: 2014-04-30 | Discharge: 2014-05-01 | DRG: 683 | Disposition: A | Discharge status: Home / Self Care | Payer: Medicare Other | Attending: Nephrology | Admitting: Nephrology

## 2014-04-30 ENCOUNTER — Emergency Department (HOSPITAL_COMMUNITY): Payer: Medicare Other

## 2014-04-30 ENCOUNTER — Encounter (HOSPITAL_COMMUNITY): Payer: Self-pay | Admitting: Emergency Medicine

## 2014-04-30 DIAGNOSIS — D631 Anemia in chronic kidney disease: Secondary | ICD-10-CM | POA: Diagnosis present

## 2014-04-30 DIAGNOSIS — N039 Chronic nephritic syndrome with unspecified morphologic changes: Secondary | ICD-10-CM | POA: Diagnosis present

## 2014-04-30 DIAGNOSIS — E875 Hyperkalemia: Secondary | ICD-10-CM | POA: Diagnosis present

## 2014-04-30 DIAGNOSIS — I160 Hypertensive urgency: Secondary | ICD-10-CM

## 2014-04-30 DIAGNOSIS — Z992 Dependence on renal dialysis: Secondary | ICD-10-CM

## 2014-04-30 DIAGNOSIS — R188 Other ascites: Secondary | ICD-10-CM | POA: Diagnosis present

## 2014-04-30 DIAGNOSIS — I120 Hypertensive chronic kidney disease with stage 5 chronic kidney disease or end stage renal disease: Secondary | ICD-10-CM | POA: Diagnosis present

## 2014-04-30 DIAGNOSIS — R011 Cardiac murmur, unspecified: Secondary | ICD-10-CM | POA: Diagnosis present

## 2014-04-30 DIAGNOSIS — N186 End stage renal disease: Secondary | ICD-10-CM | POA: Diagnosis not present

## 2014-04-30 DIAGNOSIS — E8770 Fluid overload, unspecified: Secondary | ICD-10-CM | POA: Diagnosis present

## 2014-04-30 DIAGNOSIS — N19 Unspecified kidney failure: Secondary | ICD-10-CM

## 2014-04-30 DIAGNOSIS — G43909 Migraine, unspecified, not intractable, without status migrainosus: Secondary | ICD-10-CM | POA: Diagnosis present

## 2014-04-30 DIAGNOSIS — E8779 Other fluid overload: Secondary | ICD-10-CM | POA: Diagnosis not present

## 2014-04-30 DIAGNOSIS — Z87898 Personal history of other specified conditions: Secondary | ICD-10-CM

## 2014-04-30 LAB — BASIC METABOLIC PANEL
Anion gap: 18 — ABNORMAL HIGH (ref 5–15)
BUN: 75 mg/dL — AB (ref 6–23)
CALCIUM: 7.6 mg/dL — AB (ref 8.4–10.5)
CO2: 24 mEq/L (ref 19–32)
Chloride: 95 mEq/L — ABNORMAL LOW (ref 96–112)
Creatinine, Ser: 12.35 mg/dL — ABNORMAL HIGH (ref 0.50–1.10)
GFR, EST AFRICAN AMERICAN: 4 mL/min — AB (ref >90)
GFR, EST NON AFRICAN AMERICAN: 3 mL/min — AB (ref >90)
GLUCOSE: 114 mg/dL — AB (ref 70–99)
Potassium: 5.7 mEq/L — ABNORMAL HIGH (ref 3.7–5.3)
Sodium: 137 mEq/L (ref 137–147)

## 2014-04-30 LAB — CBC WITH DIFFERENTIAL/PLATELET
BASOS PCT: 0 % (ref 0–1)
Basophils Absolute: 0 10*3/uL (ref 0.0–0.1)
EOS ABS: 0.3 10*3/uL (ref 0.0–0.7)
Eosinophils Relative: 5 % (ref 0–5)
HCT: 32.5 % — ABNORMAL LOW (ref 36.0–46.0)
HEMOGLOBIN: 10.2 g/dL — AB (ref 12.0–15.0)
Lymphocytes Relative: 14 % (ref 12–46)
Lymphs Abs: 0.8 10*3/uL (ref 0.7–4.0)
MCH: 28.7 pg (ref 26.0–34.0)
MCHC: 31.4 g/dL (ref 30.0–36.0)
MCV: 91.3 fL (ref 78.0–100.0)
MONOS PCT: 7 % (ref 3–12)
Monocytes Absolute: 0.4 10*3/uL (ref 0.1–1.0)
Neutro Abs: 4.5 10*3/uL (ref 1.7–7.7)
Neutrophils Relative %: 74 % (ref 43–77)
Platelets: 162 10*3/uL (ref 150–400)
RBC: 3.56 MIL/uL — AB (ref 3.87–5.11)
RDW: 14 % (ref 11.5–15.5)
WBC: 6.1 10*3/uL (ref 4.0–10.5)

## 2014-04-30 LAB — MAGNESIUM: Magnesium: 2 mg/dL (ref 1.5–2.5)

## 2014-04-30 LAB — PHOSPHORUS: Phosphorus: 9.4 mg/dL — ABNORMAL HIGH (ref 2.3–4.6)

## 2014-04-30 MED ORDER — CLONIDINE HCL 0.3 MG PO TABS
0.3000 mg | ORAL_TABLET | Freq: Two times a day (BID) | ORAL | Status: DC
  Administered 2014-04-30 – 2014-05-01 (×2): 0.3 mg via ORAL
  Filled 2014-04-30 (×3): qty 1
  Filled 2014-04-30 (×2): qty 3

## 2014-04-30 MED ORDER — SODIUM CHLORIDE 0.9 % IV SOLN: 100.0000 mL | INTRAVENOUS | Status: DC | PRN

## 2014-04-30 MED ORDER — ONDANSETRON HCL 4 MG PO TABS: 4.0000 mg | ORAL_TABLET | Freq: Four times a day (QID) | ORAL | Status: DC | PRN

## 2014-04-30 MED ORDER — PENTAFLUOROPROP-TETRAFLUOROETH EX AERO: 1.0000 "application " | INHALATION_SPRAY | CUTANEOUS | Status: DC | PRN

## 2014-04-30 MED ORDER — CALCIUM ACETATE 667 MG PO CAPS
1334.0000 mg | ORAL_CAPSULE | Freq: Three times a day (TID) | ORAL | Status: DC
  Administered 2014-05-01: 1334 mg via ORAL
  Filled 2014-04-30 (×4): qty 2

## 2014-04-30 MED ORDER — SODIUM CHLORIDE 0.9 % IJ SOLN: 3.0000 mL | INTRAMUSCULAR | Status: DC | PRN

## 2014-04-30 MED ORDER — SODIUM CHLORIDE 0.9 % IV SOLN: 250.0000 mL | INTRAVENOUS | Status: DC | PRN

## 2014-04-30 MED ORDER — HYDRALAZINE HCL 25 MG PO TABS
25.0000 mg | ORAL_TABLET | Freq: Once | ORAL | Status: DC
  Filled 2014-04-30: qty 1

## 2014-04-30 MED ORDER — HEPARIN SODIUM (PORCINE) 1000 UNIT/ML DIALYSIS: 1000.0000 [IU] | INTRAMUSCULAR | Status: DC | PRN

## 2014-04-30 MED ORDER — ACETAMINOPHEN 325 MG PO TABS: 650.0000 mg | ORAL_TABLET | Freq: Four times a day (QID) | ORAL | Status: DC | PRN

## 2014-04-30 MED ORDER — SODIUM CHLORIDE 0.9 % IJ SOLN
3.0000 mL | Freq: Two times a day (BID) | INTRAMUSCULAR | Status: DC
  Administered 2014-04-30 – 2014-05-01 (×2): 3 mL via INTRAVENOUS

## 2014-04-30 MED ORDER — ACETAMINOPHEN 650 MG RE SUPP: 650.0000 mg | Freq: Four times a day (QID) | RECTAL | Status: DC | PRN

## 2014-04-30 MED ORDER — LIDOCAINE-PRILOCAINE 2.5-2.5 % EX CREA: 1.0000 "application " | TOPICAL_CREAM | CUTANEOUS | Status: DC | PRN

## 2014-04-30 MED ORDER — NEPRO/CARBSTEADY PO LIQD: 237.0000 mL | ORAL | Status: DC | PRN

## 2014-04-30 MED ORDER — ONDANSETRON HCL 4 MG/2ML IJ SOLN: 4.0000 mg | Freq: Four times a day (QID) | INTRAMUSCULAR | Status: DC | PRN

## 2014-04-30 MED ORDER — LIDOCAINE HCL (PF) 1 % IJ SOLN: 5.0000 mL | INTRAMUSCULAR | Status: DC | PRN

## 2014-04-30 MED ORDER — ALTEPLASE 2 MG IJ SOLR: 2.0000 mg | Freq: Once | INTRAMUSCULAR | Status: AC | PRN

## 2014-04-30 NOTE — ED Notes (Signed)
Pt did not receive hydralazine due to dialysis tx due today. Goal is 6 lbs off.

## 2014-04-30 NOTE — Progress Notes (Signed)
Pt orientation to unit, room and routine. Information packet given to patient.  Admission INP armband ID verified with patient and in place. Side rails in place, fall risk assessment complete with patient verbalizing understanding of risks associated with falls. Pt verbalizes an understanding of how to use the call bell and to call for help before getting out of bed.   Will cont to monitor and assist as needed.  Gilman Schmidt, RN 04/30/2014 8:53 PM

## 2014-04-30 NOTE — ED Notes (Signed)
MEAL ORDERED FOR PT 

## 2014-04-30 NOTE — ED Notes (Addendum)
CALLED DIALYSIS TO SEE IF THEY ARE READY FOR PT. SPOKE TO JUANITA. SHE THINKS IT MAY BE ABOUT AN HOUR BEFORE PT GOES UPSTAIRS

## 2014-04-30 NOTE — ED Notes (Addendum)
Pt A&O x4. Pt here for dialysis treatment. States last dialysis Friday (pt on MWF schedule for dialysis).  Pt denies any SOB, chest pain, n/v. Denies any other complaint.  NAD noted.

## 2014-04-30 NOTE — ED Provider Notes (Signed)
CSN: [id number]     Arrival date & time 04/30/14  1321 History   First MD Initiated Contact with Patient 04/30/14 1617     Chief Complaint  Patient presents with  . Vascular Access Problem     (Consider location/radiation/quality/duration/timing/severity/associated sxs/prior Treatment) HPI Comments: Holly Mendoza is a 43 yo woman with a history significant for ESRD (on dialysis since 1996, T, R, Sa) and possible cardiomyopathy. She recently moved from [place] to [place] and has been unable to find placement in a dialysis center. She has received dialysis [facility] at [facility] on 6/27 7/1, 7/3 and seems like on 7/9. She has been advised to come to the ER to seek emergent dialysis until outpatient set up is worked out. Pt currently denies any chest pain, dib, palpitations. She has no n/v/f/c. Pt received periodic paracentesis, and feels like her abdomen is not distended enough at this time to need any intervention. She gets her dialysis through LLE graft.  The history is provided by the patient and medical records.    Past Medical History  Diagnosis Date  . 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 Hypertension   . ESRD on hemodialysis 04/20/2014    Patient started HD in 1998.  She has been dialyzed in [place] at "[facility]" until moving to [place] in July 2015 to live with family.  She has had failed accesses in the L arm.  No attempt to place access was made in the R arm, patient is not sure why.  She has a L thigh AVG which she says has been functional for 7-8 years.  They stopped giving her heparin several years ago due to prolonged access bleeding.    Marland Kitchen. Heart murmur   . Anemia of chronic disease   . History of blood transfusion     "several; w/transplant, infections, etc"  . Headache(784.0)     "often; sometimes daily" (04/26/2014)  . Migraines     "qod" (04/26/2014)   Past Surgical History  Procedure Laterality Date  . Arteriovenous graft placement  Left 2013    thigh  . 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 Nephrectomy transplanted organ  2013   History reviewed. No pertinent family history. History  Substance Use Topics  . Smoking status: Never Smoker   . Smokeless tobacco: Never Used  . Alcohol Use: No   OB History   Grav Para Term Preterm Abortions TAB SAB Ect Mult Living                 Review of Systems  Constitutional: Negative for diaphoresis and activity change.  Respiratory: Negative for shortness of breath.   Cardiovascular: Negative for chest pain.  Gastrointestinal: Negative for nausea, vomiting and abdominal pain.  Genitourinary: Negative for dysuria.  Musculoskeletal: Negative for neck pain.  Neurological: Negative for headaches.      Allergies  Contrast media; Vancomycin; and Procardia  Home Medications   Prior to Admission medications   Medication Sig Start Date End Date Taking? Authorizing Provider  calcium carbonate (TUMS - DOSED IN MG ELEMENTAL CALCIUM) 500 MG chewable tablet Chew 1-2 tablets by mouth daily as needed for indigestion or heartburn.   Yes Historical Provider, MD  cloNIDine (CATAPRES) 0.3 MG tablet Take 0.3 mg by mouth 2 (two) times daily.   Yes Historical Provider, MD  calcium acetate (PHOSLO) 667 MG capsule Take 2 capsules (1,334  mg total) by mouth 3 (three) times daily with meals. 04/27/14   Darden Palmer, MD   BP 183/100  Pulse 89  Temp(Src) 98.3 F (36.8 C) (Oral)  Resp 16  Ht 5\' 9"  (1.753 m)  Wt 176 lb 5.9 oz (80 kg)  BMI 26.03 kg/m2  SpO2 97% Physical Exam  Nursing note and vitals reviewed. Constitutional: She is oriented to person, place, and time. She appears well-developed and well-nourished.  HENT:  Head: Normocephalic and atraumatic.  Eyes: EOM are normal. Pupils are equal, round, and reactive to light.  Neck: Neck supple.  Cardiovascular: Normal rate and regular rhythm.   Murmur heard. Pulmonary/Chest: Effort  normal. No respiratory distress.  Abdominal: Soft. She exhibits no distension. There is no tenderness. There is no rebound and no guarding.  Musculoskeletal:  Right sided graft - no signs of infection, + thrill  Neurological: She is alert and oriented to person, place, and time.  Skin: Skin is warm and dry.    ED Course  Procedures (including critical care time) Labs Review Labs Reviewed  CBC WITH DIFFERENTIAL - Abnormal; Notable for the following:    RBC 3.56 (*)    Hemoglobin 10.2 (*)    HCT 32.5 (*)    All other components within normal limits  BASIC METABOLIC PANEL - Abnormal; Notable for the following:    Potassium 5.7 (*)    Chloride 95 (*)    Glucose, Bld 114 (*)    BUN 75 (*)    Creatinine, Ser 12.35 (*)    Calcium 7.6 (*)    GFR calc non Af Amer 3 (*)    GFR calc Af Amer 4 (*)    Anion gap 18 (*)    All other components within normal limits  PHOSPHORUS - Abnormal; Notable for the following:    Phosphorus 9.4 (*)    All other components within normal limits  MAGNESIUM    Imaging Review Dg Chest Port 1 View  04/30/2014   CLINICAL DATA:  End-stage renal disease.  Vascular access problem.  EXAM: PORTABLE CHEST - 1 VIEW  COMPARISON:  04/24/2014  FINDINGS: Moderate right pleural effusion. Interstitial accentuation at the lung bases and in the mid lungs. Heart size within normal limits. No pneumothorax observed.  IMPRESSION: 1. Moderate right pleural effusion. 2. Mild interstitial accentuation in the lung bases and mid lungs, query low grade interstitial edema. Prior cardiomegaly has resolved.   Electronically Signed   By: Herbie Baltimore M.D.   On: 04/30/2014 17:34     EKG Interpretation   Date/Time:  Monday April 30 2014 17:18:37 EDT Ventricular Rate:  78 PR Interval:  143 QRS Duration: 147 QT Interval:  443 QTC Calculation: 505 R Axis:   89 Text Interpretation:  Sinus rhythm Right bundle branch block Unchanged  Confirmed by Rhunette Croft, MD, Janey Genta [phone]) on  04/30/2014 5:52:41 PM      MDM   Final diagnoses:  ESRD (end stage renal disease) on dialysis  Hyperkalemia  Uremia  Hypertensive urgency  Pt comes in for dialysis. She looks hemodynamically stable - except for having elevated BP, with no red flags on hx and exam suggestive of end organ damage. K is 5.7 with some uremia. Spoke with Nephrology - they will admit her to their team.     Derwood Kaplan, MD 04/30/14 1757

## 2014-04-30 NOTE — ED Notes (Signed)
MD at bedside.  Nephrologist

## 2014-04-30 NOTE — H&P (Signed)
Renal Service History & Physical [facility]  Holly Mendoza is an 43 y.o. female.  Chief Complaint: "I'm here for dialysis" HPI: Pt is here for dialysis. She moved here recently from [place] and she has not been accepted yet to a local dialysis unit possibly related to a history of "behavioral concerns". She has a case Production designer, theatre/television/film that is working to help her get into a local dialysis unit (?[place]) and until then, she is forced to seek hemodialysis in the hospital setting.  She has leg swelling, no SOB or CP, no f/c/s, no n/v/d.  Last HD removed 6L on 7/9 and she was a bit light-headed after that one.  She has chronic ascites and gets paracentesis from time to time for symptoms.  She had a paracentesis on her last admission last week.  She has a hx of HTN, ESRD since 1998, renal tx in 2012 that didn't take, removed in 2013; also migraine HA's, anemia, heart murmur.       Date  Hours of HD   Net UF  Post HD Wt 04/14/14 4   4.0  L  87.3 kg  04/18/14  4   2.0 L  88.2 kg 04/20/14  4   2.0 L  86.2 kg   04/25/14  4   2.8 L  90.2 kg 04/26/14  4   6.0 L  80.1 kg   ROS  as above  Past Medical History  Past Medical History  Diagnosis Date  . 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 Hypertension   . ESRD on hemodialysis 04/20/2014    Patient started HD in 1998.  She has been dialyzed in [place] at "[facility]" until moving to [place] in July 2015 to live with family.  She has had failed accesses in the L arm.  No attempt to place access was made in the R arm, patient is not sure why.  She has a L thigh AVG which she says has been functional for 7-8 years.  They stopped giving her heparin several years ago due to prolonged access bleeding.    Marland Kitchen Heart murmur   . Anemia of chronic disease   . History of blood transfusion     "several; w/transplant, infections, etc"  . Headache(784.0)     "often; sometimes daily" (04/26/2014)  . Migraines     "qod" (04/26/2014)  . ESRD on hemodialysis 04/20/2014    Patient started HD in 1998.  She has been dialyzed in [place] at "[facility]" until moving to [place] in July 2015 to live with family.  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  She has had failed accesses in the L arm.  No attempt to place access was made in the R arm, patient is not sure why.  She has a L thigh AVG which she says has been functional for   Past Surgical History  Past Surgical History  Procedure Laterality Date  . Arteriovenous graft placement Left 2013    thigh  . 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 Nephrectomy transplanted organ  2013   Family History History reviewed. No pertinent family history. Social History  reports that she has never smoked. She has never used smokeless tobacco. She reports that she does not drink alcohol or use illicit drugs. Allergies  Allergies  Allergen  Reactions  . Contrast Media [Iodinated Diagnostic Agents] Anaphylaxis  . Vancomycin Anaphylaxis and Hives  . Procardia [Nifedipine] Other (See Comments)    Sweating, BP drops dangerously low,   Home medications Prior to Admission medications   Medication Sig Start Date End Date Taking? Authorizing Provider  calcium carbonate (TUMS - DOSED IN MG ELEMENTAL CALCIUM) 500 MG chewable tablet Chew 1-2 tablets by mouth daily as needed for indigestion or heartburn.   Yes Historical Provider, MD  cloNIDine (CATAPRES) 0.3 MG tablet Take 0.3 mg by mouth 2 (two) times daily.   Yes Historical Provider, MD  calcium acetate (PHOSLO) 667 MG capsule Take 2 capsules (1,334 mg total) by mouth 3 (three) times daily with meals. 04/27/14   Darden PalmerSamaya Qureshi, MD   Liver Function Tests  Recent Labs Lab 04/24/14 1428 04/25/14 0600 04/26/14 1235 04/26/14 1700  AST 16 11  --   --   ALT 15 12  --   --   ALKPHOS 151* 142*  --   --   BILITOT 0.2* 0.2*  --   --   PROT  6.2 5.9*  --   --   ALBUMIN 2.3* 2.1* 2.1* 2.1*   No results found for this basename: LIPASE, AMYLASE,  in the last 168 hours CBC  Recent Labs Lab 04/24/14 1428 04/25/14 0753 04/26/14 1700 04/30/14 1700  WBC 6.9 6.8 6.1 6.1  NEUTROABS 5.2  --   --  4.5  HGB 10.2* 10.6* 9.4* 10.2*  HCT 32.7* 33.6* 29.9* 32.5*  MCV 90.8 90.3 89.8 91.3  PLT 196 183 167 162   Basic Metabolic Panel  Recent Labs Lab 04/24/14 1428 04/25/14 0753 04/26/14 1235 04/26/14 1700 04/30/14 1700  NA 142 140 140 140 137  K 6.0* 5.9* 5.6* 5.3 5.7*  CL 98 98 99 98 95*  CO2 23 19 24 22 24   GLUCOSE 155* 122* 85 114* 114*  BUN 83* 89* 64* 68* 75*  CREATININE 12.69* 13.18* 10.63* 10.69* 12.35*  CALCIUM 7.7* 7.5* 7.7* 7.6* 7.6*  PHOS  --   --  8.3* 7.7* 9.4*    Filed Vitals:   04/30/14 1330 04/30/14 1719 04/30/14 1752 04/30/14 1800  BP: 183/100 166/85 165/90 161/92  Pulse: 89   77  Temp: 98.3 F (36.8 C)     TempSrc: Oral     Resp: 16 19  22   Height: 5\' 9"  (1.753 m)     Weight: 80 kg (176 lb 5.9 oz)     SpO2: 97%   100%   Exam: No distress, calm No rash, cyanosis or gangrene Sclera anicteric, throat clear No jvd Chest is clear bilat RRR loud 3/6 SEM rusb, no S3 Abd obese, NTND, +ascites 2+ 2-3+ bilat pitting LE edema Neuro is nf, Ox 3 L thigh AVG patent  HD: 4 hours, no heparin   Assessment: 1 ESRD on HD- no outpatient center yet, comes to hospital for HD; no heparin with HD for several yrs according to pt 2 Volume excess- no resp compromise 3 HTN 4 Ascites get paracentesis regularly 5 Anemia Hb 10.2 6 Hyperkalemia K 5.7   Plan- HD in am as we are backed up upstairs due to emergencies.  Get ECHO for LV function given ascites.   Vinson Moselleob Catalena Stanhope MD (pgr) [phone]    ([phone]) [phone] 04/30/2014, 6:39 PM

## 2014-05-01 ENCOUNTER — Encounter (HOSPITAL_COMMUNITY): Payer: Self-pay

## 2014-05-01 DIAGNOSIS — I369 Nonrheumatic tricuspid valve disorder, unspecified: Secondary | ICD-10-CM

## 2014-05-01 LAB — RENAL FUNCTION PANEL
ALBUMIN: 2.1 g/dL — AB (ref 3.5–5.2)
Anion gap: 20 — ABNORMAL HIGH (ref 5–15)
BUN: 80 mg/dL — ABNORMAL HIGH (ref 6–23)
CALCIUM: 7.5 mg/dL — AB (ref 8.4–10.5)
CO2: 21 mEq/L (ref 19–32)
CREATININE: 12.58 mg/dL — AB (ref 0.50–1.10)
Chloride: 95 mEq/L — ABNORMAL LOW (ref 96–112)
GFR calc Af Amer: 4 mL/min — ABNORMAL LOW (ref >90)
GFR calc non Af Amer: 3 mL/min — ABNORMAL LOW (ref >90)
Glucose, Bld: 97 mg/dL (ref 70–99)
Phosphorus: 9.7 mg/dL — ABNORMAL HIGH (ref 2.3–4.6)
Potassium: 6 mEq/L — ABNORMAL HIGH (ref 3.7–5.3)
Sodium: 136 mEq/L — ABNORMAL LOW (ref 137–147)

## 2014-05-01 LAB — CBC
HCT: 31.5 % — ABNORMAL LOW (ref 36.0–46.0)
Hemoglobin: 9.9 g/dL — ABNORMAL LOW (ref 12.0–15.0)
MCH: 28.4 pg (ref 26.0–34.0)
MCHC: 31.4 g/dL (ref 30.0–36.0)
MCV: 90.3 fL (ref 78.0–100.0)
Platelets: 157 10*3/uL (ref 150–400)
RBC: 3.49 MIL/uL — ABNORMAL LOW (ref 3.87–5.11)
RDW: 14.1 % (ref 11.5–15.5)
WBC: 6.2 10*3/uL (ref 4.0–10.5)

## 2014-05-01 NOTE — Progress Notes (Signed)
Echocardiogram 2D Echocardiogram has been performed.  Dorothey Baseman 05/01/2014, 1:38 PM

## 2014-05-01 NOTE — Discharge Summary (Signed)
Physician Discharge Summary  Patient ID: Holly Mendoza MRN: [id number] DOB/AGE: Jun 15, 1971 43 y.o.  Admit date: 04/30/2014 Discharge date: 05/01/2014  Admission Diagnoses: 1 Volume excess / fluid overload 2 ESRD on HD 3 Chronic ascites 4 HTN 5 Anemia of KCD 6 Hyperkalemia   Discharge Diagnoses:  1 ESRD - has not been accepted yet to a local outpatient unit since moving from [place] 2 Chronic ascites 3 HTN on clonidine 4 HPTH on CaCO3 binders 5 Hyperkalemia- resolved 6 Anemia of CKD Hb 10.2   Discharged Condition: good  Hospital Course: 43 yo AAF with hx of HTN, ESRD , ascites who moved here recently from [place] and has not been accepted yet to a local dialysis unit for outpatient dialysis. There is a reported history of "behavioral concerns". Reportedly she has a case manager that is working to help her get established with a HD unit in the area.  Until then, the only recourse for HD is in the inpatient hospital setting.  Patient presented today to ED asking for dialysis.  +SOB and DOE and leg swelling, no other complaints.   Patient was admitted, home meds were continued.  HD was done the next morning due to back up and emergencies in the inpatient HD unit.  She had HD this am with about 4L removed and feels much better, wants to go home.  She has been told to return for HD as needed to the ER.     Consults: None  Treatments: dialysis: Hemodialysis  Discharge Exam: Blood pressure 167/86, pulse 92, temperature 97.8 F (36.6 C), temperature source Oral, resp. rate 18, height 5\' 9"  (1.753 m), weight 79 kg (174 lb 2.6 oz), SpO2 95.00%. Alert, no distress No jvd Chest clear bilat RRR 3/6 SEM rusb, no RG 2+ ascites, NTND 1+ pitting LE edema bilat Neuro is nf, Ox3  L thigh AVG patent  Disposition: 01-Home or Self Care  Discharge Instructions   Discharge patient    Complete by:  As directed             Medication List         calcium acetate 667 MG capsule   Commonly known as:  PHOSLO  Take 2 capsules (1,334 mg total) by mouth 3 (three) times daily with meals.     calcium carbonate 500 MG chewable tablet  Commonly known as:  TUMS - dosed in mg elemental calcium  Chew 1-2 tablets by mouth daily as needed for indigestion or heartburn.     cloNIDine 0.3 MG tablet  Commonly known as:  CATAPRES  Take 0.3 mg by mouth 2 (two) times daily.         SignedDelano Metz D 05/01/2014, 4:03 PM

## 2014-05-01 NOTE — Progress Notes (Signed)
Patient Discharge:  Patient discharged to home in w/c accompanied by the staff.   Education: Provided education on diet, new medications, fluid restrictions, and signs and symptoms of fluid overload. IV: Peripheral IV removed before discharge. Follow-up appointments: Reviewed with the patient. Belongings: Patient took all her belongings with her.

## 2014-05-03 ENCOUNTER — Ambulatory Visit: Payer: Medicare Other | Admitting: Internal Medicine

## 2014-05-03 ENCOUNTER — Non-Acute Institutional Stay (HOSPITAL_COMMUNITY)
Admission: EM | Admit: 2014-05-03 | Discharge: 2014-05-03 | Disposition: A | Discharge status: Home / Self Care | Payer: Medicare Other | Attending: Emergency Medicine | Admitting: Emergency Medicine

## 2014-05-03 DIAGNOSIS — N186 End stage renal disease: Secondary | ICD-10-CM | POA: Diagnosis not present

## 2014-05-03 DIAGNOSIS — Z91199 Patient's noncompliance with other medical treatment and regimen due to unspecified reason: Secondary | ICD-10-CM | POA: Diagnosis not present

## 2014-05-03 DIAGNOSIS — Z992 Dependence on renal dialysis: Secondary | ICD-10-CM | POA: Diagnosis not present

## 2014-05-03 DIAGNOSIS — I120 Hypertensive chronic kidney disease with stage 5 chronic kidney disease or end stage renal disease: Secondary | ICD-10-CM | POA: Diagnosis not present

## 2014-05-03 DIAGNOSIS — Z940 Kidney transplant status: Secondary | ICD-10-CM | POA: Diagnosis not present

## 2014-05-03 DIAGNOSIS — D638 Anemia in other chronic diseases classified elsewhere: Secondary | ICD-10-CM | POA: Insufficient documentation

## 2014-05-03 DIAGNOSIS — R011 Cardiac murmur, unspecified: Secondary | ICD-10-CM | POA: Insufficient documentation

## 2014-05-03 DIAGNOSIS — Z9119 Patient's noncompliance with other medical treatment and regimen: Secondary | ICD-10-CM | POA: Insufficient documentation

## 2014-05-03 LAB — COMPREHENSIVE METABOLIC PANEL
ALT: 18 U/L (ref 0–35)
AST: 20 U/L (ref 0–37)
Albumin: 2.2 g/dL — ABNORMAL LOW (ref 3.5–5.2)
Alkaline Phosphatase: 173 U/L — ABNORMAL HIGH (ref 39–117)
Anion gap: 18 — ABNORMAL HIGH (ref 5–15)
BUN: 61 mg/dL — ABNORMAL HIGH (ref 6–23)
CALCIUM: 8.1 mg/dL — AB (ref 8.4–10.5)
CO2: 26 meq/L (ref 19–32)
CREATININE: 10.31 mg/dL — AB (ref 0.50–1.10)
Chloride: 94 mEq/L — ABNORMAL LOW (ref 96–112)
GFR calc Af Amer: 5 mL/min — ABNORMAL LOW (ref >90)
GFR, EST NON AFRICAN AMERICAN: 4 mL/min — AB (ref >90)
Glucose, Bld: 81 mg/dL (ref 70–99)
Potassium: 5.2 mEq/L (ref 3.7–5.3)
SODIUM: 138 meq/L (ref 137–147)
TOTAL PROTEIN: 6.1 g/dL (ref 6.0–8.3)
Total Bilirubin: 0.2 mg/dL — ABNORMAL LOW (ref 0.3–1.2)

## 2014-05-03 LAB — CBC WITH DIFFERENTIAL/PLATELET
BASOS ABS: 0 10*3/uL (ref 0.0–0.1)
Basophils Relative: 0 % (ref 0–1)
EOS ABS: 0.3 10*3/uL (ref 0.0–0.7)
EOS PCT: 5 % (ref 0–5)
HEMATOCRIT: 30.3 % — AB (ref 36.0–46.0)
Hemoglobin: 9.4 g/dL — ABNORMAL LOW (ref 12.0–15.0)
LYMPHS PCT: 19 % (ref 12–46)
Lymphs Abs: 1.2 10*3/uL (ref 0.7–4.0)
MCH: 28.1 pg (ref 26.0–34.0)
MCHC: 31 g/dL (ref 30.0–36.0)
MCV: 90.4 fL (ref 78.0–100.0)
MONO ABS: 0.6 10*3/uL (ref 0.1–1.0)
Monocytes Relative: 10 % (ref 3–12)
Neutro Abs: 4 10*3/uL (ref 1.7–7.7)
Neutrophils Relative %: 66 % (ref 43–77)
PLATELETS: 165 10*3/uL (ref 150–400)
RBC: 3.35 MIL/uL — ABNORMAL LOW (ref 3.87–5.11)
RDW: 13.9 % (ref 11.5–15.5)
WBC: 6.1 10*3/uL (ref 4.0–10.5)

## 2014-05-03 MED ORDER — PENTAFLUOROPROP-TETRAFLUOROETH EX AERO
1.0000 "application " | INHALATION_SPRAY | CUTANEOUS | Status: DC | PRN
  Filled 2014-05-03: qty 103.5

## 2014-05-03 MED ORDER — ALTEPLASE 2 MG IJ SOLR
2.0000 mg | Freq: Once | INTRAMUSCULAR | Status: DC | PRN
Start: 2014-05-03 — End: 2014-05-04
  Filled 2014-05-03: qty 2

## 2014-05-03 MED ORDER — HEPARIN SODIUM (PORCINE) 1000 UNIT/ML DIALYSIS
1000.0000 [IU] | INTRAMUSCULAR | Status: DC | PRN
  Filled 2014-05-03: qty 1

## 2014-05-03 MED ORDER — NEPRO/CARBSTEADY PO LIQD
237.0000 mL | ORAL | Status: DC | PRN
  Filled 2014-05-03: qty 237

## 2014-05-03 MED ORDER — LIDOCAINE HCL (PF) 1 % IJ SOLN: 5.0000 mL | INTRAMUSCULAR | Status: DC | PRN

## 2014-05-03 MED ORDER — SODIUM CHLORIDE 0.9 % IV SOLN: 100.0000 mL | INTRAVENOUS | Status: DC | PRN

## 2014-05-03 MED ORDER — LIDOCAINE-PRILOCAINE 2.5-2.5 % EX CREA
1.0000 "application " | TOPICAL_CREAM | CUTANEOUS | Status: DC | PRN
  Filled 2014-05-03: qty 5

## 2014-05-03 NOTE — ED Notes (Addendum)
Attempted to call report

## 2014-05-03 NOTE — Progress Notes (Signed)
  CARE MANAGEMENT ED NOTE 05/03/2014  Patient:  Holly Mendoza, Holly Mendoza   Account Number:  [id number]  Date Initiated:  05/03/2014  Documentation initiated by:  Ferdinand Cava  Subjective/Objective Assessment:   43 yo feamle presenting to the ED with need to be dialyzed     Subjective/Objective Assessment Detail:     Action/Plan:   Patient will follow up with new PCP appointment at [facility] on July 20th at 3pm   Action/Plan Detail:   Anticipated DC Date:       Status Recommendation to Physician:   Result of Recommendation:  Agreed    DC Planning Services  CM consult  Follow-up appt scheduled  PCP issues  Other    Choice offered to / List presented to:  C-1 Patient          Status of service:  Completed, signed off  ED Comments:   ED Comments Detail:  This CM spoke with the patient regarding Medicare status with no PCP. The patient stated that she [facility] but could not find the discharge information and could not locate the phone number. This CM called the [facility] and rescheduled the patient's appointment to July 20th at 3pm. The information was updated on the patients discharge paper work and the information was also provided to the patient, which she wrote down. Explained where the [facility] is to the patient and also explained the importance of keeping the appointment otherwise cancelling and rescheduling. Patient verbalized understanding. The patient also stated that the dialysis center here continues to locate an outside dialysis center and continues to follow up with the patient, until then the instructions are to return to the hospital to be dialyzed. No further CM needs at this time.

## 2014-05-03 NOTE — ED Notes (Signed)
Tiffany PA at bedside  

## 2014-05-03 NOTE — ED Provider Notes (Signed)
CSN: [id number]     Arrival date & time 05/03/14  [id number] History   First MD Initiated Contact with Patient 05/03/14 1113     Chief Complaint  Patient presents with  . Vascular Access Problem     (Consider location/radiation/quality/duration/timing/severity/associated sxs/prior Treatment) HPI  Holly Mendoza is a 43 yo woman with a history significant for ESRD (on dialysis since 1996, T, R, Sa) and possible cardiomyopathy. She reports needing paracentesis recently and has been feeling really well since then. She recently moved from [place] to [place] and has been unable to find placement in a dialysis center. She has a Child psychotherapistsocial worker who is working on finding her a spot but due to a record of non compliance she is having a hard time finding a location. She has received dialysis [facility] at [facility] on 6/27 7/1, 7/3, 7/9, 7/13. She has been advised to come to the ER for dialysis until she has a location.   She gets her dialysis through LLE graft. Pt currently denies any chest pain, dib, palpitations. The history is provided by the patient and medical records.      Past Medical History  Diagnosis Date  . 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 Hypertension   . ESRD on hemodialysis 04/20/2014    Patient started HD in 1998.  She has been dialyzed in [place] at "[facility]" until moving to [place] in July 2015 to live with family.  She has had failed accesses in the L arm.  No attempt to place access was made in the R arm, patient is not sure why.  She has a L thigh AVG which she says has been functional for 7-8 years.  They stopped giving her heparin several years ago due to prolonged access bleeding.    Marland Kitchen. Heart murmur   . Anemia of chronic disease   . History of blood transfusion     "several; w/transplant, infections, etc"  . Headache(784.0)     "often; sometimes daily" (04/26/2014)  . Migraines     "qod" (04/26/2014)  . ESRD on hemodialysis 04/20/2014     Patient started HD in 1998.  She has been dialyzed in [place] at "[facility]" until moving to [place] in July 2015 to live with family.  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  She has had failed accesses in the L arm.  No attempt to place access was made in the R arm, patient is not sure why.  She has a L thigh AVG which she says has been functional for   Past Surgical History  Procedure Laterality Date  . Arteriovenous graft placement Left 2013    thigh  . 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 Nephrectomy transplanted organ  2013   No family history on file. History  Substance Use Topics  . Smoking status: Never Smoker   . Smokeless tobacco: Never Used  . Alcohol Use: No   OB History   Grav Para Term Preterm Abortions TAB SAB Ect Mult Living                 Review of Systems  Review of Systems  Constitutional: Negative for diaphoresis and activity change.  Respiratory: Negative for shortness of breath.  Cardiovascular: Negative for chest pain.  Gastrointestinal: Negative for nausea, vomiting and abdominal pain.  Genitourinary: Negative  for dysuria.  Musculoskeletal: Negative for neck pain.  Neurological: Negative for headaches.    Allergies  Contrast media; Vancomycin; and Procardia  Home Medications   Prior to Admission medications   Medication Sig Start Date End Date Taking? Authorizing Provider  calcium carbonate (TUMS - DOSED IN MG ELEMENTAL CALCIUM) 500 MG chewable tablet Chew 1-2 tablets by mouth daily as needed for indigestion or heartburn.   Yes Historical Provider, MD  cloNIDine (CATAPRES) 0.3 MG tablet Take 0.3 mg by mouth 2 (two) times daily.   Yes Historical Provider, MD  calcium acetate (PHOSLO) 667 MG capsule Take 2 capsules (1,334 mg total) by mouth 3 (three) times daily with meals. 04/27/14   Darden Palmer, MD   BP 171/95  Pulse 79  Temp(Src) 98.3 F (36.8 C)  (Oral)  Resp 15  Ht 5' 9.5" (1.765 m)  Wt 174 lb 2.6 oz (79 kg)  BMI 25.36 kg/m2  SpO2 100% Physical Exam  Nursing note and vitals reviewed. Constitutional: She appears well-developed and well-nourished. No distress.  HENT:  Head: Normocephalic and atraumatic.  Eyes: Pupils are equal, round, and reactive to light.  Neck: Normal range of motion. Neck supple.  Cardiovascular: Normal rate and regular rhythm.   Pulmonary/Chest: Effort normal.  Abdominal: Soft.  Musculoskeletal:  Left thigh graft, + thrill no signs of infection or bleeding  Neurological: She is alert.  Skin: Skin is warm and dry.    ED Course  Procedures (including critical care time) Labs Review Labs Reviewed  CBC WITH DIFFERENTIAL - Abnormal; Notable for the following:    RBC 3.35 (*)    Hemoglobin 9.4 (*)    HCT 30.3 (*)    All other components within normal limits  COMPREHENSIVE METABOLIC PANEL - Abnormal; Notable for the following:    Chloride 94 (*)    BUN 61 (*)    Creatinine, Ser 10.31 (*)    Calcium 8.1 (*)    Albumin 2.2 (*)    Alkaline Phosphatase 173 (*)    Total Bilirubin 0.2 (*)    GFR calc non Af Amer 4 (*)    GFR calc Af Amer 5 (*)    Anion gap 18 (*)    All other components within normal limits    Imaging Review No results found.   EKG Interpretation None      MDM   Final diagnoses:  Dialysis patient   1:00 pm I spoke with Dr. Allyne Gee, Nephrology. He reports that we can call up to the dialysis center and coordinate time to bring patient up and receive dialysis. Pt informed. Labs are close to patients baseline.  Filed Vitals:   05/03/14 1230  BP: 171/95  Pulse: 79  Temp:   Resp: 15        Herma Ard 05/03/14 1305  Medical screening examination/treatment/procedure(s) were performed by non-physician practitioner and as supervising physician I was immediately available for consultation/collaboration.   EKG Interpretation None      Rolland Porter  MD  Rolland Porter, MD 05/24/14 [phone]

## 2014-05-03 NOTE — ED Notes (Signed)
Dialysis called to get time frame for dialysis- sts unknown but will be "a few hours". Pt. Updated on status in NAD.

## 2014-05-03 NOTE — ED Notes (Addendum)
Comes for dialysis  MWF  Has not been set up for dialysis to go anywhere else she states graft in  Left thigh

## 2014-05-07 ENCOUNTER — Non-Acute Institutional Stay (HOSPITAL_COMMUNITY)
Admission: EM | Admit: 2014-05-07 | Discharge: 2014-05-07 | Disposition: A | Discharge status: Home / Self Care | Payer: Medicare Other | Attending: Emergency Medicine | Admitting: Emergency Medicine

## 2014-05-07 ENCOUNTER — Encounter: Payer: Self-pay | Admitting: Internal Medicine

## 2014-05-07 ENCOUNTER — Ambulatory Visit (INDEPENDENT_AMBULATORY_CARE_PROVIDER_SITE_OTHER): Payer: Medicare Other | Admitting: Internal Medicine

## 2014-05-07 ENCOUNTER — Encounter (HOSPITAL_COMMUNITY): Payer: Self-pay | Admitting: Emergency Medicine

## 2014-05-07 VITALS — BP 158/95 | HR 81 | Temp 97.6°F | Ht 69.0 in | Wt 191.2 lb

## 2014-05-07 DIAGNOSIS — N186 End stage renal disease: Secondary | ICD-10-CM | POA: Insufficient documentation

## 2014-05-07 DIAGNOSIS — Z992 Dependence on renal dialysis: Secondary | ICD-10-CM | POA: Insufficient documentation

## 2014-05-07 DIAGNOSIS — Z91199 Patient's noncompliance with other medical treatment and regimen due to unspecified reason: Secondary | ICD-10-CM | POA: Diagnosis not present

## 2014-05-07 DIAGNOSIS — E875 Hyperkalemia: Secondary | ICD-10-CM | POA: Diagnosis not present

## 2014-05-07 DIAGNOSIS — I120 Hypertensive chronic kidney disease with stage 5 chronic kidney disease or end stage renal disease: Secondary | ICD-10-CM | POA: Diagnosis not present

## 2014-05-07 DIAGNOSIS — D638 Anemia in other chronic diseases classified elsewhere: Secondary | ICD-10-CM | POA: Insufficient documentation

## 2014-05-07 DIAGNOSIS — G43909 Migraine, unspecified, not intractable, without status migrainosus: Secondary | ICD-10-CM | POA: Insufficient documentation

## 2014-05-07 DIAGNOSIS — Z940 Kidney transplant status: Secondary | ICD-10-CM | POA: Insufficient documentation

## 2014-05-07 DIAGNOSIS — R188 Other ascites: Secondary | ICD-10-CM | POA: Diagnosis present

## 2014-05-07 DIAGNOSIS — J811 Chronic pulmonary edema: Secondary | ICD-10-CM | POA: Diagnosis present

## 2014-05-07 DIAGNOSIS — Z9119 Patient's noncompliance with other medical treatment and regimen: Secondary | ICD-10-CM | POA: Diagnosis not present

## 2014-05-07 DIAGNOSIS — I10 Essential (primary) hypertension: Secondary | ICD-10-CM

## 2014-05-07 DIAGNOSIS — Z905 Acquired absence of kidney: Secondary | ICD-10-CM | POA: Insufficient documentation

## 2014-05-07 DIAGNOSIS — I151 Hypertension secondary to other renal disorders: Secondary | ICD-10-CM

## 2014-05-07 DIAGNOSIS — N2889 Other specified disorders of kidney and ureter: Principal | ICD-10-CM

## 2014-05-07 LAB — CBC WITH DIFFERENTIAL/PLATELET
BASOS ABS: 0 10*3/uL (ref 0.0–0.1)
BASOS PCT: 0 % (ref 0–1)
EOS ABS: 0.3 10*3/uL (ref 0.0–0.7)
Eosinophils Relative: 5 % (ref 0–5)
HEMATOCRIT: 34.1 % — AB (ref 36.0–46.0)
Hemoglobin: 10.6 g/dL — ABNORMAL LOW (ref 12.0–15.0)
Lymphocytes Relative: 13 % (ref 12–46)
Lymphs Abs: 0.9 10*3/uL (ref 0.7–4.0)
MCH: 28.6 pg (ref 26.0–34.0)
MCHC: 31.1 g/dL (ref 30.0–36.0)
MCV: 91.9 fL (ref 78.0–100.0)
MONO ABS: 0.6 10*3/uL (ref 0.1–1.0)
MONOS PCT: 8 % (ref 3–12)
NEUTROS ABS: 5.6 10*3/uL (ref 1.7–7.7)
Neutrophils Relative %: 74 % (ref 43–77)
Platelets: 179 10*3/uL (ref 150–400)
RBC: 3.71 MIL/uL — ABNORMAL LOW (ref 3.87–5.11)
RDW: 13.9 % (ref 11.5–15.5)
WBC: 7.5 10*3/uL (ref 4.0–10.5)

## 2014-05-07 LAB — I-STAT CHEM 8, ED
BUN: 95 mg/dL — ABNORMAL HIGH (ref 6–23)
CALCIUM ION: 1 mmol/L — AB (ref 1.12–1.23)
CHLORIDE: 100 meq/L (ref 96–112)
Creatinine, Ser: 13.4 mg/dL — ABNORMAL HIGH (ref 0.50–1.10)
GLUCOSE: 95 mg/dL (ref 70–99)
HCT: 35 % — ABNORMAL LOW (ref 36.0–46.0)
HEMOGLOBIN: 11.9 g/dL — AB (ref 12.0–15.0)
Potassium: 6.1 mEq/L — ABNORMAL HIGH (ref 3.7–5.3)
Sodium: 135 mEq/L — ABNORMAL LOW (ref 137–147)
TCO2: 22 mmol/L (ref 0–100)

## 2014-05-07 NOTE — Progress Notes (Signed)
Pt completed tx without problems, no bleeding observed post tx. Pt d/c'd home with family, no c/o's voiced

## 2014-05-07 NOTE — Assessment & Plan Note (Signed)
BP Readings from Last 3 Encounters:  05/07/14 174/96  05/07/14 158/95  05/03/14 183/95    Lab Results  Component Value Date   NA 135* 05/07/2014   K 6.1* 05/07/2014   CREATININE 13.40* 05/07/2014    Assessment: Blood pressure control: moderately elevated Progress toward BP goal:  unable to assess Comments:  -She is clearly fluid overloaded today and has not taken her medicine making it difficult for me to assess her BP baseline and thus change her current treatment regimen.   Plan: Medications:  clonidine 0.3mg  BID Educational resources provided:   Self management tools provided:   Other plans:  -Advised patient to take medicine twice daily consistently and to maintain a log of her blood pressures -Asked to f/u with her nephrologist for dialysis

## 2014-05-07 NOTE — Progress Notes (Signed)
ED called and asked [place] to call ext. [phone] when we are ready for Holly Mendoza. I have repeatedly called that number and have received no answer. Called the ED at [phone] also.  They reported Holly Mendoza is OTF and not in a room so they do not know where exactly she is. Trying to get report so that patient can come dialyze. Will continue to try to find patient's location through the ED.

## 2014-05-07 NOTE — ED Notes (Signed)
Pt placed into room and changing into gown. Hope, RN at bedside to place pt on monitor. Nurse made aware that pt needed to be triaged.

## 2014-05-07 NOTE — Patient Instructions (Addendum)
Please take the clonidine at a regular time - once in the morning and once in the evening.  Thank you for bringing your medicines today. This helps [place] keep you safe from mistakes. Please bring your BP log next time as well.

## 2014-05-07 NOTE — ED Notes (Signed)
Called dialysis to confirm time for patient.  They stated they would get her by 1600

## 2014-05-07 NOTE — ED Notes (Signed)
Pt stated she needs dialysis because she just moved to [facility]

## 2014-05-07 NOTE — Progress Notes (Signed)
   Subjective:    Patient ID: Holly Mendoza, female    DOB: 06-06-1971, 43 y.o.   MRN: [id number]  HPI Holly Mendoza is a 43 year old woman with HTN, ESRD and a history of ascites who presents to clinic today for hospital follow-up.  She was hospitalized 7/7-7/9 for volume overload. She moved from [place] and has not established care with outpatient dialysis and was thus recommended to go to the ED for hemodialysis which she did on 7/13, 7/16 and is schedule for today as well. In [place], she was receiving dialysis three times a week and paracentesis once a week. She got paracentesis two weeks ago and had 4L pulled off.   Her BP today in clinic is 158/95. She is on clonidine 0.3mg  BID. She has not taken her medicine today as she feels it drops her BP too low and that her dialysis will decrease her BP. She does note shortness of breath, fatigue, changes in vision but nothing out of the ordinary for when she gets fluid overloaded. She denies chest pain.    Review of Systems  Constitutional: Positive for fatigue.  Eyes:       Change in vision  Respiratory: Positive for shortness of breath.   Cardiovascular: Negative for chest pain.  All other systems reviewed and are negative.      Objective:   Physical Exam  Constitutional: She is oriented to person, place, and time. She appears well-developed and well-nourished. No distress.  HENT:  Head: Normocephalic and atraumatic.  Cardiovascular:  Systolic ejection murmur at RUSB  Pulmonary/Chest: Effort normal and breath sounds normal. No respiratory distress. She has no wheezes. She has no rales.  Abdominal: Bowel sounds are normal. She exhibits distension. There is no tenderness.  Ascites present.  Neurological: She is alert and oriented to person, place, and time. No cranial nerve deficit.  Skin: She is not diaphoretic.  Psychiatric: She has a normal mood and affect. Her behavior is normal.          Assessment & Plan:

## 2014-05-07 NOTE — ED Provider Notes (Signed)
CSN: [id number]     Arrival date & time 05/07/14  1130 History   First MD Initiated Contact with Patient 05/07/14 1149     Chief Complaint  Patient presents with  . Ascites     (Consider location/radiation/quality/duration/timing/severity/associated sxs/prior Treatment) The history is provided by the patient.   patient presents for dialysis. She recently moved down to [place] from [place]. She's been here about a month. She has not been able to find a dialysis center due to previous noncompliance. Last dialyzed on Thursday. She has an appointment at 3:00 today with the internal medicine clinic. She states that she feels like she is carrying extra fluid. Mild trouble breathing. She states her abdomen is more swollen. She states her abdomen is swollen because of her heart. No fevers.  Past Medical History  Diagnosis Date  . 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 Hypertension   . ESRD on hemodialysis 04/20/2014    Patient started HD in 1998.  She has been dialyzed in [place] at "[facility]" until moving to [place] in July 2015 to live with family.  She has had failed accesses in the L arm.  No attempt to place access was made in the R arm, patient is not sure why.  She has a L thigh AVG which she says has been functional for 7-8 years.  They stopped giving her heparin several years ago due to prolonged access bleeding.    Marland Kitchen Heart murmur   . Anemia of chronic disease   . History of blood transfusion     "several; w/transplant, infections, etc"  . Headache(784.0)     "often; sometimes daily" (04/26/2014)  . Migraines     "qod" (04/26/2014)  . ESRD on hemodialysis 04/20/2014    Patient started HD in 1998.  She has been dialyzed in [place] at "[facility]" until moving to [place] in July 2015 to live with family.  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  She has had failed accesses in the L arm.  No attempt to  place access was made in the R arm, patient is not sure why.  She has a L thigh AVG which she says has been functional for   Past Surgical History  Procedure Laterality Date  . Arteriovenous graft placement Left 2013    thigh  . 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 Nephrectomy transplanted organ  2013   History reviewed. No pertinent family history. History  Substance Use Topics  . Smoking status: Never Smoker   . Smokeless tobacco: Never Used  . Alcohol Use: No   OB History   Grav Para Term Preterm Abortions TAB SAB Ect Mult Living                 Review of Systems  Constitutional: Negative for fever, activity change and appetite change.  Eyes: Negative for pain.  Respiratory: Positive for shortness of breath. Negative for chest tightness.   Cardiovascular: Negative for chest pain and leg swelling.  Gastrointestinal: Positive for abdominal pain. Negative for nausea, vomiting and diarrhea.  Genitourinary: Negative for flank pain.  Musculoskeletal: Negative for back pain and neck stiffness.  Skin: Negative for rash.  Neurological: Negative for weakness, numbness and headaches.  Psychiatric/Behavioral: Negative for behavioral problems.      Allergies  Contrast media; Procardia; and Vancomycin  Home Medications   Prior to Admission medications   Medication Sig Start Date End Date Taking? Authorizing Provider  calcium acetate (PHOSLO) 667 MG capsule Take 2 capsules (1,334 mg total) by mouth 3 (three) times daily with meals. 04/27/14  Yes Darden PalmerSamaya Qureshi, MD  calcium carbonate (TUMS - DOSED IN MG ELEMENTAL CALCIUM) 500 MG chewable tablet Chew 2 tablets by mouth 3 (three) times daily as needed for indigestion or heartburn.    Yes Historical Provider, MD  cloNIDine (CATAPRES) 0.3 MG tablet Take 0.3 mg by mouth 2 (two) times daily.   Yes Historical Provider, MD  Skin Protectants, Misc. (EUCERIN) cream Apply 1 application  topically daily.   Yes Historical Provider, MD   BP 174/96  Pulse 83  Temp(Src) 98.3 F (36.8 C) (Oral)  Resp 19  Ht 5\' 9"  (1.753 m)  Wt 186 lb (84.369 kg)  BMI 27.45 kg/m2  SpO2 96% Physical Exam  Constitutional: She appears well-developed and well-nourished.  Cardiovascular: Normal rate and regular rhythm.   Pulmonary/Chest: Effort normal.  Rales bilateral bases  Abdominal: Soft. She exhibits distension. There is tenderness.  Mild distention with mild disuse tenderness  Musculoskeletal: She exhibits edema.  Mild bilateral lower extremity pitting edema. Dialysis graft left thigh    ED Course  Procedures (including critical care time) Labs Review Labs Reviewed  CBC WITH DIFFERENTIAL - Abnormal; Notable for the following:    RBC 3.71 (*)    Hemoglobin 10.6 (*)    HCT 34.1 (*)    All other components within normal limits  I-STAT CHEM 8, ED - Abnormal; Notable for the following:    Sodium 135 (*)    Potassium 6.1 (*)    BUN 95 (*)    Creatinine, Ser 13.40 (*)    Calcium, Ion 1.00 (*)    Hemoglobin 11.9 (*)    HCT 35.0 (*)    All other components within normal limits    Imaging Review No results found.   EKG Interpretation None      MDM   Final diagnoses:  ESRD (end stage renal disease) on dialysis  Hyperkalemia    Patient presents for dialysis. Mild hypokalemia. She does not have her on dialysis center yet. Seen in ER by Dr. Kathrene BongoGoldsborough. Patient has an appointment with her internal medicine followup at 3:00. Nephrology was like the patient to keep this appointment and after that she'll go up to the dialysis floor.    Juliet RudeNathan R. Rubin PayorPickering, MD 05/07/14 [phone]

## 2014-05-08 ENCOUNTER — Telehealth: Payer: Self-pay | Admitting: Internal Medicine

## 2014-05-08 NOTE — Telephone Encounter (Signed)
I spoke with RN at her nephrologist's office in [place], and it appears she has a history of not finishing her treatments. She would only schedule herself for 2/3 dialysis treatments a week and would sometimes leave prematurely before treatments have been completed. Should any records be needed from her, here's contact information.   Phone: [phone]  Fax: [phone]

## 2014-05-09 NOTE — Progress Notes (Signed)
INTERNAL MEDICINE TEACHING ATTENDING ADDENDUM - Earl Lagos, MD: I personally saw and evaluated Ms. Papworth in this clinic visit in conjunction with the resident, Dr. Allena Katz. I have discussed patient's plan of care with medical resident during this visit. I have confirmed the physical exam findings and have read and agree with the clinic note including the plan with the following addition: - Patient here for BP follow up - On exam:- Lungs- CTA b/l, Cardio- RRR, normal heart sounds, b/l LE edema + - Patient needs to get established with outpatient HD center. To follow up with placement worker - To go to ED to get HD till established with outpatient HD center - Difficult to assess baseline BP given fluid overload status and not taking meds today - Advised patient to continue with current meds for now - Pt to be referred to community health and wellness to establish care with outpatient PCP

## 2014-05-11 ENCOUNTER — Encounter (HOSPITAL_COMMUNITY): Payer: Self-pay | Admitting: Emergency Medicine

## 2014-05-11 ENCOUNTER — Emergency Department (HOSPITAL_COMMUNITY)
Admission: EM | Admit: 2014-05-11 | Discharge: 2014-05-12 | Disposition: A | Discharge status: Home / Self Care | Payer: Medicare Other | Attending: Emergency Medicine | Admitting: Emergency Medicine

## 2014-05-11 DIAGNOSIS — Z940 Kidney transplant status: Secondary | ICD-10-CM | POA: Insufficient documentation

## 2014-05-11 DIAGNOSIS — R0602 Shortness of breath: Secondary | ICD-10-CM | POA: Insufficient documentation

## 2014-05-11 DIAGNOSIS — R011 Cardiac murmur, unspecified: Secondary | ICD-10-CM | POA: Insufficient documentation

## 2014-05-11 DIAGNOSIS — I120 Hypertensive chronic kidney disease with stage 5 chronic kidney disease or end stage renal disease: Secondary | ICD-10-CM | POA: Diagnosis not present

## 2014-05-11 DIAGNOSIS — Z79899 Other long term (current) drug therapy: Secondary | ICD-10-CM | POA: Diagnosis not present

## 2014-05-11 DIAGNOSIS — N186 End stage renal disease: Secondary | ICD-10-CM | POA: Diagnosis not present

## 2014-05-11 DIAGNOSIS — Z862 Personal history of diseases of the blood and blood-forming organs and certain disorders involving the immune mechanism: Secondary | ICD-10-CM | POA: Diagnosis not present

## 2014-05-11 DIAGNOSIS — E8770 Fluid overload, unspecified: Secondary | ICD-10-CM | POA: Diagnosis present

## 2014-05-11 DIAGNOSIS — Z992 Dependence on renal dialysis: Secondary | ICD-10-CM | POA: Insufficient documentation

## 2014-05-11 LAB — RENAL FUNCTION PANEL
Albumin: 2.3 g/dL — ABNORMAL LOW (ref 3.5–5.2)
Anion gap: 23 — ABNORMAL HIGH (ref 5–15)
BUN: 82 mg/dL — ABNORMAL HIGH (ref 6–23)
CO2: 21 meq/L (ref 19–32)
CREATININE: 11.91 mg/dL — AB (ref 0.50–1.10)
Calcium: 7.3 mg/dL — ABNORMAL LOW (ref 8.4–10.5)
Chloride: 98 mEq/L (ref 96–112)
GFR calc non Af Amer: 3 mL/min — ABNORMAL LOW (ref >90)
GFR, EST AFRICAN AMERICAN: 4 mL/min — AB (ref >90)
GLUCOSE: 101 mg/dL — AB (ref 70–99)
Phosphorus: 9.7 mg/dL — ABNORMAL HIGH (ref 2.3–4.6)
Potassium: 6.7 mEq/L (ref 3.7–5.3)
Sodium: 142 mEq/L (ref 137–147)

## 2014-05-11 LAB — HEPATITIS B SURFACE ANTIGEN: Hepatitis B Surface Ag: NEGATIVE

## 2014-05-11 LAB — HEPATITIS B CORE ANTIBODY, TOTAL: HEP B C TOTAL AB: NONREACTIVE

## 2014-05-11 LAB — CBC
HEMATOCRIT: 31.6 % — AB (ref 36.0–46.0)
HEMOGLOBIN: 10.2 g/dL — AB (ref 12.0–15.0)
MCH: 29.3 pg (ref 26.0–34.0)
MCHC: 32.3 g/dL (ref 30.0–36.0)
MCV: 90.8 fL (ref 78.0–100.0)
Platelets: 181 10*3/uL (ref 150–400)
RBC: 3.48 MIL/uL — AB (ref 3.87–5.11)
RDW: 14 % (ref 11.5–15.5)
WBC: 7.3 10*3/uL (ref 4.0–10.5)

## 2014-05-11 LAB — FERRITIN: Ferritin: 1085 ng/mL — ABNORMAL HIGH (ref 10–291)

## 2014-05-11 LAB — HEPATITIS B SURFACE ANTIBODY,QUALITATIVE: Hep B S Ab: POSITIVE — AB

## 2014-05-11 LAB — IRON AND TIBC
IRON: 71 ug/dL (ref 42–135)
Saturation Ratios: 31 % (ref 20–55)
TIBC: 228 ug/dL — AB (ref 250–470)
UIBC: 157 ug/dL (ref 125–400)

## 2014-05-11 MED ORDER — LIDOCAINE-PRILOCAINE 2.5-2.5 % EX CREA: 1.0000 "application " | TOPICAL_CREAM | CUTANEOUS | Status: DC | PRN

## 2014-05-11 MED ORDER — ALTEPLASE 2 MG IJ SOLR: 2.0000 mg | Freq: Once | INTRAMUSCULAR | Status: DC | PRN

## 2014-05-11 MED ORDER — HEPARIN SODIUM (PORCINE) 1000 UNIT/ML DIALYSIS: 1000.0000 [IU] | INTRAMUSCULAR | Status: DC | PRN

## 2014-05-11 MED ORDER — LIDOCAINE HCL (PF) 1 % IJ SOLN: 5.0000 mL | INTRAMUSCULAR | Status: DC | PRN

## 2014-05-11 MED ORDER — ALTEPLASE 2 MG IJ SOLR: 2.0000 mg | Freq: Once | INTRAMUSCULAR | Status: AC | PRN

## 2014-05-11 MED ORDER — SODIUM CHLORIDE 0.9 % IV SOLN: 100.0000 mL | INTRAVENOUS | Status: DC | PRN

## 2014-05-11 MED ORDER — HEPARIN SODIUM (PORCINE) 1000 UNIT/ML DIALYSIS: 20.0000 [IU]/kg | INTRAMUSCULAR | Status: DC | PRN

## 2014-05-11 MED ORDER — SODIUM CHLORIDE 0.9 % IV SOLN
100.0000 mL | INTRAVENOUS | Status: DC | PRN
Start: 2014-05-11 — End: 2014-05-11

## 2014-05-11 MED ORDER — NEPRO/CARBSTEADY PO LIQD: 237.0000 mL | ORAL | Status: DC | PRN

## 2014-05-11 MED ORDER — PENTAFLUOROPROP-TETRAFLUOROETH EX AERO: 1.0000 "application " | INHALATION_SPRAY | CUTANEOUS | Status: DC | PRN

## 2014-05-11 MED ORDER — LIDOCAINE HCL (PF) 1 % IJ SOLN
5.0000 mL | INTRAMUSCULAR | Status: DC | PRN
Start: 2014-05-11 — End: 2014-05-11

## 2014-05-11 NOTE — ED Provider Notes (Signed)
Medical screening examination/treatment/procedure(s) were performed by non-physician practitioner and as supervising physician I was immediately available for consultation/collaboration.     Suzi Roots, MD 05/11/14 2049

## 2014-05-11 NOTE — Progress Notes (Signed)
Pt arrived in ED needing a hemodialysis tx. Tx started and completed with no problems. Pt signed off 30 minutes early. No complaints, alert, vss. Discharged from hospital after tx.

## 2014-05-11 NOTE — Progress Notes (Signed)
Stat potassium lab of 6.7 received from lab. MD Lin aware. Pt on 0k bath for 30 mins, then 1k bath for 60 mins.

## 2014-05-11 NOTE — ED Provider Notes (Signed)
CSN: [id number]     Arrival date & time 05/11/14  1548 History  This chart was scribed for Fayrene HelperBowie Tayron Hunnell, PA-C working with Suzi RootsKevin E Steinl, MD by Evon Slackerrance Branch, ED Scribe. This patient was seen in room TR07C/TR07C and the patient's care was started at 5:54 PM.    Chief Complaint  Patient presents with  . Shortness of Breath   Patient is a 43 y.o. female presenting with shortness of breath. The history is provided by the patient. No language interpreter was used.  Shortness of Breath Associated symptoms: no chest pain and no fever    HPI Comments: Holly Mendoza is a 43 y.o. female who presents to the Emergency Department for her dialysis. She states she normally receives on Monday Wednesday and Friday . She states she missed Wednesday due to her ride being out of town. She states she is here to continue to receive dialysis and normally checks into the ED when she received dialysis. She states the reason she receives dialysis is due to unknown hypertension. She states he recently moved to this area from [place] 3 weeks ago. She states her site of access is her right leg. Denies sob or other related symptoms.  Pt has been a dialysis pt for the past 16 years.  Her nephrologist is Dr. Jamesetta OrleansBorough.  Past Medical History  Diagnosis Date  . 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 Hypertension   . ESRD on hemodialysis 04/20/2014    Patient started HD in 1998.  She has been dialyzed in [place] at "[facility]" until moving to [place] in July 2015 to live with family.  She has had failed accesses in the L arm.  No attempt to place access was made in the R arm, patient is not sure why.  She has a L thigh AVG which she says has been functional for 7-8 years.  They stopped giving her heparin several years ago due to prolonged access bleeding.    Marland Kitchen. Heart murmur   . Anemia of chronic disease   . History of blood transfusion     "several; w/transplant, infections, etc"   . Headache(784.0)     "often; sometimes daily" (04/26/2014)  . Migraines     "qod" (04/26/2014)  . ESRD on hemodialysis 04/20/2014    Patient started HD in 1998.  She has been dialyzed in [place] at "[facility]" until moving to [place] in July 2015 to live with family.  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  She has had failed accesses in the L arm.  No attempt to place access was made in the R arm, patient is not sure why.  She has a L thigh AVG which she says has been functional for   Past Surgical History  Procedure Laterality Date  . Arteriovenous graft placement Left 2013    thigh  . 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 Nephrectomy transplanted organ  2013   No family history on file. History  Substance Use Topics  . Smoking status: Never Smoker   . Smokeless tobacco: Never Used  . Alcohol Use: No   OB History   Grav Para Term Preterm Abortions TAB SAB Ect Mult Living                 Review of Systems  Constitutional: Negative for fever.  Respiratory: Negative for shortness of breath.   Cardiovascular: Negative for chest pain.  All other systems reviewed and are negative.  Allergies  Contrast media; Procardia; and Vancomycin  Home Medications   Prior to Admission medications   Medication Sig Start Date End Date Taking? Authorizing Provider  calcium carbonate (TUMS - DOSED IN MG ELEMENTAL CALCIUM) 500 MG chewable tablet Chew 2 tablets by mouth 3 (three) times daily as needed for indigestion or heartburn.    Yes Historical Provider, MD  cloNIDine (CATAPRES) 0.3 MG tablet Take 0.3 mg by mouth 2 (two) times daily.   Yes Historical Provider, MD  Skin Protectants, Misc. (EUCERIN) cream Apply 1 application topically daily as needed.    Yes Historical Provider, MD   Triage Vitals: BP 169/81  Pulse 104  Temp(Src) 98.4 F (36.9 C) (Oral)  Resp 18  SpO2 95%  Physical Exam  Nursing  note and vitals reviewed. Constitutional: She is oriented to person, place, and time. She appears well-developed and well-nourished. No distress.  HENT:  Head: Normocephalic and atraumatic.  Eyes: Conjunctivae and EOM are normal.  Neck: Neck supple. No tracheal deviation present.  Cardiovascular: Normal rate.   Murmur heard.  Systolic murmur is present with a grade of 3/6  3/6 systolic heart murmur best heard at the 3rd to 4th left intercostal pace radiate to left axillary region  Pulmonary/Chest: Effort normal and breath sounds normal. No respiratory distress.  Musculoskeletal: Normal range of motion.  Neurological: She is alert and oriented to person, place, and time.  Skin: Skin is warm and dry.  left upper thigh graft with palpable thrills  Psychiatric: She has a normal mood and affect. Her behavior is normal.    ED Course  Procedures (including critical care time) DIAGNOSTIC STUDIES: Oxygen Saturation is 95% on RA, normal by my interpretation.    COORDINATION OF CARE:  Pt is here for inpt dialysis, which she has been receiving for the past 3 weeks since moving to [place] from [place].  She is currently without any specific sxs.  No CP, no SOB.  Dialysis center is aware of her arrival and is expecting.    Labs Review Labs Reviewed  CBC  BASIC METABOLIC PANEL    Imaging Review No results found.   EKG Interpretation None      MDM   Final diagnoses:  Visit for chronic in-center kidney dialysis    BP 169/81  Pulse 104  Temp(Src) 98.4 F (36.9 C) (Oral)  Resp 18  SpO2 95%   I personally performed the services described in this documentation, which was scribed in my presence. The recorded information has been reviewed and is accurate.       Fayrene Helper, PA-C 05/11/14 1818

## 2014-05-11 NOTE — ED Notes (Signed)
Pt states that she is SOB starting today. She usually has a paracentsis once a week and last had dialysis on Monday. Pt states that she normally experiences SOB when she is due for her paracentesis. States that SOB is better with positioning. NAD noted

## 2014-05-14 ENCOUNTER — Encounter (HOSPITAL_COMMUNITY): Payer: Self-pay | Admitting: Emergency Medicine

## 2014-05-14 ENCOUNTER — Emergency Department (HOSPITAL_COMMUNITY)
Admission: EM | Admit: 2014-05-14 | Discharge: 2014-05-15 | Disposition: A | Discharge status: Home / Self Care | Payer: Medicare Other | Attending: Emergency Medicine | Admitting: Emergency Medicine

## 2014-05-14 DIAGNOSIS — N186 End stage renal disease: Secondary | ICD-10-CM | POA: Insufficient documentation

## 2014-05-14 DIAGNOSIS — Z992 Dependence on renal dialysis: Secondary | ICD-10-CM | POA: Diagnosis present

## 2014-05-14 DIAGNOSIS — Z940 Kidney transplant status: Secondary | ICD-10-CM | POA: Insufficient documentation

## 2014-05-14 DIAGNOSIS — I120 Hypertensive chronic kidney disease with stage 5 chronic kidney disease or end stage renal disease: Secondary | ICD-10-CM | POA: Insufficient documentation

## 2014-05-14 LAB — RENAL FUNCTION PANEL
Albumin: 2.4 g/dL — ABNORMAL LOW (ref 3.5–5.2)
Anion gap: 20 — ABNORMAL HIGH (ref 5–15)
BUN: 65 mg/dL — ABNORMAL HIGH (ref 6–23)
CALCIUM: 7.7 mg/dL — AB (ref 8.4–10.5)
CHLORIDE: 99 meq/L (ref 96–112)
CO2: 23 meq/L (ref 19–32)
Creatinine, Ser: 10.85 mg/dL — ABNORMAL HIGH (ref 0.50–1.10)
GFR, EST AFRICAN AMERICAN: 4 mL/min — AB (ref >90)
GFR, EST NON AFRICAN AMERICAN: 4 mL/min — AB (ref >90)
GLUCOSE: 111 mg/dL — AB (ref 70–99)
Phosphorus: 9.2 mg/dL — ABNORMAL HIGH (ref 2.3–4.6)
Potassium: 5.4 mEq/L — ABNORMAL HIGH (ref 3.7–5.3)
SODIUM: 142 meq/L (ref 137–147)

## 2014-05-14 LAB — CBC
HCT: 30.5 % — ABNORMAL LOW (ref 36.0–46.0)
Hemoglobin: 9.8 g/dL — ABNORMAL LOW (ref 12.0–15.0)
MCH: 28.6 pg (ref 26.0–34.0)
MCHC: 32.1 g/dL (ref 30.0–36.0)
MCV: 88.9 fL (ref 78.0–100.0)
PLATELETS: 174 10*3/uL (ref 150–400)
RBC: 3.43 MIL/uL — AB (ref 3.87–5.11)
RDW: 13.5 % (ref 11.5–15.5)
WBC: 7.2 10*3/uL (ref 4.0–10.5)

## 2014-05-14 NOTE — ED Notes (Signed)
Dr J Knapp at bedside 

## 2014-05-14 NOTE — ED Notes (Signed)
Per pt sts she is here for dialysis. sts last one Friday. sts she feels okay no complaints.

## 2014-05-14 NOTE — ED Notes (Signed)
Pt placed on continuous pulse oximetry and blood pressure cuff; Tramaine, EMT aware of pt; Caryn Bee, ED Registration speaking with pt now

## 2014-05-14 NOTE — ED Notes (Signed)
Brought pt back to room; pt getting undressed and into a gown at this time 

## 2014-05-14 NOTE — ED Notes (Signed)
Went to eat

## 2014-05-14 NOTE — Progress Notes (Signed)
Pt tolerated tx well, goal met. Pt d/c'd home with family no c/o's voiced

## 2014-05-14 NOTE — ED Notes (Signed)
Pt paged twice for reassessment, looked in the main ED waiting area and outside for pt without success; Martie Lee, RN notified

## 2014-05-14 NOTE — ED Provider Notes (Signed)
CSN: [id number]     Arrival date & time 05/14/14  1327 History   First MD Initiated Contact with Patient 05/14/14 1646     Chief Complaint  Patient presents with  . needs dialysis     HPI Patient states she is here for her routine dialysis. [place] [place] [facility] [place] in the last month or 2. She has not been able to get an outpatient hemodialysis center arranged. Patient has been coming to the emergency room to get dialysis here in the hospital. Case management has been attempting to make arrangements to get her set up with an outpatient dialysis center. Patient last had dialysis on Friday. She is here for her routine Monday Wednesday Friday dialysis. The patient denies any complaints of shortness of breath or difficulty breathing. She's not having any fevers or chills Past Medical History  Diagnosis Date  . 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 Hypertension   . ESRD on hemodialysis 04/20/2014    Patient started HD in 1998.  She has been dialyzed in [place] at "[facility]" until moving to [place] in July 2015 to live with family.  She has had failed accesses in the L arm.  No attempt to place access was made in the R arm, patient is not sure why.  She has a L thigh AVG which she says has been functional for 7-8 years.  They stopped giving her heparin several years ago due to prolonged access bleeding.    Marland Kitchen. Heart murmur   . Anemia of chronic disease   . History of blood transfusion     "several; w/transplant, infections, etc"  . Headache(784.0)     "often; sometimes daily" (04/26/2014)  . Migraines     "qod" (04/26/2014)  . ESRD on hemodialysis 04/20/2014    Patient started HD in 1998.  She has been dialyzed in [place] at "[facility]" until moving to [place] in July 2015 to live with family.  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  She has had failed accesses in the L arm.  No attempt to  place access was made in the R arm, patient is not sure why.  She has a L thigh AVG which she says has been functional for   Past Surgical History  Procedure Laterality Date  . Arteriovenous graft placement Left 2013    thigh  . 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 Nephrectomy transplanted organ  2013   History reviewed. No pertinent family history. History  Substance Use Topics  . Smoking status: Never Smoker   . Smokeless tobacco: Never Used  . Alcohol Use: No   OB History   Grav Para Term Preterm Abortions TAB SAB Ect Mult Living                 Review of Systems  All other systems reviewed and are negative.     Allergies  Contrast media; Procardia; and Vancomycin  Home Medications   Prior to Admission medications   Medication Sig Start Date End Date Taking? Authorizing Provider  calcium carbonate (TUMS - DOSED IN MG ELEMENTAL CALCIUM) 500 MG chewable tablet Chew 2 tablets by mouth 3 (three) times daily as needed for indigestion or heartburn.    Yes Historical Provider, MD  cloNIDine (CATAPRES) 0.3 MG tablet Take 0.3 mg by  mouth 2 (two) times daily.   Yes Historical Provider, MD  Skin Protectants, Misc. (EUCERIN) cream Apply 1 application topically daily as needed.    Yes Historical Provider, MD   BP 175/89  Pulse 91  Temp(Src) 97.9 F (36.6 C) (Oral)  Resp 18  SpO2 100% Physical Exam  Nursing note and vitals reviewed. Constitutional: She appears well-developed and well-nourished. No distress.  HENT:  Head: Normocephalic and atraumatic.  Right Ear: External ear normal.  Left Ear: External ear normal.  Eyes: Conjunctivae are normal. Right eye exhibits no discharge. Left eye exhibits no discharge. No scleral icterus.  Neck: Neck supple. No tracheal deviation present.  Cardiovascular: Normal rate and regular rhythm.  Exam reveals no gallop and no friction rub.   No murmur heard. Pulmonary/Chest: Effort normal  and breath sounds normal. No stridor. No respiratory distress. She has no wheezes. She has no rales.  Musculoskeletal: She exhibits no edema.  Neurological: She is alert. Cranial nerve deficit: no gross deficits.  Skin: Skin is warm and dry. No rash noted.  Psychiatric: She has a normal mood and affect.    ED Course  Procedures (including critical care time) Reviewed records from recent visits as well as clinic visit in the [facility]  MDM   Final diagnoses:  ESRD (end stage renal disease) on dialysis    The patient is here in emergent for routine dialysis. I will consult with the nephrologist.  [phone]  Discussed case with Dr Lowell Guitar.  He will contact dialysis center to see how they have been handling her case.   Linwood Dibbles, MD 05/14/14 [phone]

## 2014-05-14 NOTE — ED Notes (Signed)
Hemodialysis ready for pt

## 2014-05-16 ENCOUNTER — Encounter (HOSPITAL_COMMUNITY): Payer: Self-pay | Admitting: Emergency Medicine

## 2014-05-16 ENCOUNTER — Emergency Department (HOSPITAL_COMMUNITY)
Admission: EM | Admit: 2014-05-16 | Discharge: 2014-05-16 | Disposition: A | Discharge status: Home / Self Care | Payer: Medicare Other | Attending: Emergency Medicine | Admitting: Emergency Medicine

## 2014-05-16 DIAGNOSIS — Z940 Kidney transplant status: Secondary | ICD-10-CM | POA: Diagnosis not present

## 2014-05-16 DIAGNOSIS — N186 End stage renal disease: Secondary | ICD-10-CM | POA: Diagnosis not present

## 2014-05-16 DIAGNOSIS — D631 Anemia in chronic kidney disease: Secondary | ICD-10-CM | POA: Insufficient documentation

## 2014-05-16 DIAGNOSIS — Z992 Dependence on renal dialysis: Secondary | ICD-10-CM | POA: Diagnosis not present

## 2014-05-16 DIAGNOSIS — I120 Hypertensive chronic kidney disease with stage 5 chronic kidney disease or end stage renal disease: Secondary | ICD-10-CM | POA: Insufficient documentation

## 2014-05-16 DIAGNOSIS — N039 Chronic nephritic syndrome with unspecified morphologic changes: Secondary | ICD-10-CM

## 2014-05-16 LAB — CBC
HCT: 32.4 % — ABNORMAL LOW (ref 36.0–46.0)
Hemoglobin: 10.2 g/dL — ABNORMAL LOW (ref 12.0–15.0)
MCH: 28.9 pg (ref 26.0–34.0)
MCHC: 31.5 g/dL (ref 30.0–36.0)
MCV: 91.8 fL (ref 78.0–100.0)
PLATELETS: 162 10*3/uL (ref 150–400)
RBC: 3.53 MIL/uL — ABNORMAL LOW (ref 3.87–5.11)
RDW: 13.7 % (ref 11.5–15.5)
WBC: 7 10*3/uL (ref 4.0–10.5)

## 2014-05-16 LAB — RENAL FUNCTION PANEL
ANION GAP: 20 — AB (ref 5–15)
Albumin: 2.4 g/dL — ABNORMAL LOW (ref 3.5–5.2)
BUN: 52 mg/dL — ABNORMAL HIGH (ref 6–23)
CALCIUM: 7.8 mg/dL — AB (ref 8.4–10.5)
CHLORIDE: 97 meq/L (ref 96–112)
CO2: 24 mEq/L (ref 19–32)
Creatinine, Ser: 10.1 mg/dL — ABNORMAL HIGH (ref 0.50–1.10)
GFR calc Af Amer: 5 mL/min — ABNORMAL LOW (ref >90)
GFR, EST NON AFRICAN AMERICAN: 4 mL/min — AB (ref >90)
GLUCOSE: 82 mg/dL (ref 70–99)
POTASSIUM: 5.2 meq/L (ref 3.7–5.3)
Phosphorus: 7.8 mg/dL — ABNORMAL HIGH (ref 2.3–4.6)
SODIUM: 141 meq/L (ref 137–147)

## 2014-05-16 NOTE — ED Provider Notes (Signed)
CSN: [id number]     Arrival date & time 05/16/14  1238 History   First MD Initiated Contact with Patient 05/16/14 1525     Chief Complaint  Patient presents with  . Vascular Access Problem     (Consider location/radiation/quality/duration/timing/severity/associated sxs/prior Treatment) HPI Comments: Patient is a 43 year old female with history of end-stage renal disease on hemodialysis. She presents for her routine dialysis. She is due to be area from [place] and has not had arrangements made with an outpatient Center. She has been coming here and being dialyzed through the ER. She denies any complaints. She denies any difficulty breathing, swelling, or weakness.  The history is provided by the patient.    Past Medical History  Diagnosis Date  . 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 Hypertension   . ESRD on hemodialysis 04/20/2014    Patient started HD in 1998.  She has been dialyzed in [place] at "[facility]" until moving to [place] in July 2015 to live with family.  She has had failed accesses in the L arm.  No attempt to place access was made in the R arm, patient is not sure why.  She has a L thigh AVG which she says has been functional for 7-8 years.  They stopped giving her heparin several years ago due to prolonged access bleeding.    Marland Kitchen. Heart murmur   . Anemia of chronic disease   . History of blood transfusion     "several; w/transplant, infections, etc"  . Headache(784.0)     "often; sometimes daily" (04/26/2014)  . Migraines     "qod" (04/26/2014)  . ESRD on hemodialysis 04/20/2014    Patient started HD in 1998.  She has been dialyzed in [place] at "[facility]" until moving to [place] in July 2015 to live with family.  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  She has had failed accesses in the L arm.  No attempt to place access was made in the R arm, patient is not sure why.  She has a L thigh  AVG which she says has been functional for   Past Surgical History  Procedure Laterality Date  . Arteriovenous graft placement Left 2013    thigh  . 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 Nephrectomy transplanted organ  2013   History reviewed. No pertinent family history. History  Substance Use Topics  . Smoking status: Never Smoker   . Smokeless tobacco: Never Used  . Alcohol Use: No   OB History   Grav Para Term Preterm Abortions TAB SAB Ect Mult Living                 Review of Systems  All other systems reviewed and are negative.     Allergies  Contrast media; Procardia; and Vancomycin  Home Medications   Prior to Admission medications   Medication Sig Start Date End Date Taking? Authorizing Provider  calcium carbonate (TUMS - DOSED IN MG ELEMENTAL CALCIUM) 500 MG chewable tablet Chew 2 tablets by mouth 3 (three) times daily as needed for indigestion or heartburn.    Yes Historical Provider, MD  cloNIDine (CATAPRES) 0.3 MG tablet Take 0.3 mg by mouth 2 (two) times daily.   Yes Historical Provider, MD  Skin Protectants, Misc. (EUCERIN) cream Apply 1 application topically daily as needed for  dry skin.    Yes Historical Provider, MD   BP 180/96  Pulse 97  Temp(Src) 98.9 F (37.2 C) (Oral)  Resp 18  SpO2 95% Physical Exam  Nursing note and vitals reviewed. Constitutional: She is oriented to person, place, and time. She appears well-developed and well-nourished. No distress.  HENT:  Head: Normocephalic and atraumatic.  Mouth/Throat: Oropharynx is clear and moist.  Neck: Normal range of motion. Neck supple.  Cardiovascular: Normal rate.   No murmur heard. Pulmonary/Chest: Effort normal and breath sounds normal. No respiratory distress.  Musculoskeletal: Normal range of motion. She exhibits no edema.  Neurological: She is alert and oriented to person, place, and time.  Skin: Skin is warm and dry. She is not  diaphoretic.    ED Course  Procedures (including critical care time) Labs Review Labs Reviewed - No data to display  Imaging Review No results found.   EKG Interpretation None      MDM   Final diagnoses:  None    Dr. Hyman Hopes will arrange dialysis.    Geoffery Lyons, MD 05/16/14 1538

## 2014-05-16 NOTE — ED Notes (Signed)
Pt ordered a renal diet tray.

## 2014-05-16 NOTE — ED Notes (Signed)
Pt reports being here for dialysis, has no complaints. Last dialysis was Monday. No acute distress noted at triage.

## 2014-05-19 ENCOUNTER — Emergency Department (HOSPITAL_COMMUNITY)
Admission: EM | Admit: 2014-05-19 | Discharge: 2014-05-19 | Disposition: A | Discharge status: Home / Self Care | Payer: Medicare Other | Attending: Emergency Medicine | Admitting: Emergency Medicine

## 2014-05-19 ENCOUNTER — Encounter (HOSPITAL_COMMUNITY): Payer: Self-pay | Admitting: Emergency Medicine

## 2014-05-19 DIAGNOSIS — Z992 Dependence on renal dialysis: Secondary | ICD-10-CM | POA: Insufficient documentation

## 2014-05-19 DIAGNOSIS — N186 End stage renal disease: Secondary | ICD-10-CM

## 2014-05-19 LAB — CBC
HCT: 29.5 % — ABNORMAL LOW (ref 36.0–46.0)
HEMOGLOBIN: 9.6 g/dL — AB (ref 12.0–15.0)
MCH: 29.3 pg (ref 26.0–34.0)
MCHC: 32.5 g/dL (ref 30.0–36.0)
MCV: 89.9 fL (ref 78.0–100.0)
PLATELETS: 154 10*3/uL (ref 150–400)
RBC: 3.28 MIL/uL — AB (ref 3.87–5.11)
RDW: 13.4 % (ref 11.5–15.5)
WBC: 6.5 10*3/uL (ref 4.0–10.5)

## 2014-05-19 LAB — RENAL FUNCTION PANEL
Albumin: 2.4 g/dL — ABNORMAL LOW (ref 3.5–5.2)
Anion gap: 17 — ABNORMAL HIGH (ref 5–15)
BUN: 58 mg/dL — ABNORMAL HIGH (ref 6–23)
CO2: 23 mEq/L (ref 19–32)
Calcium: 7.9 mg/dL — ABNORMAL LOW (ref 8.4–10.5)
Chloride: 95 mEq/L — ABNORMAL LOW (ref 96–112)
Creatinine, Ser: 10.27 mg/dL — ABNORMAL HIGH (ref 0.50–1.10)
GFR calc Af Amer: 5 mL/min — ABNORMAL LOW (ref >90)
GFR calc non Af Amer: 4 mL/min — ABNORMAL LOW (ref >90)
Glucose, Bld: 88 mg/dL (ref 70–99)
Phosphorus: 8.4 mg/dL — ABNORMAL HIGH (ref 2.3–4.6)
Potassium: 5.5 mEq/L — ABNORMAL HIGH (ref 3.7–5.3)
Sodium: 135 mEq/L — ABNORMAL LOW (ref 137–147)

## 2014-05-19 LAB — HEPATITIS B SURFACE ANTIGEN: Hepatitis B Surface Ag: NEGATIVE

## 2014-05-19 NOTE — ED Provider Notes (Signed)
CSN: [id number]     Arrival date & time 05/19/14  1226 History   None    Chief Complaint  Patient presents with  . Abdominal Pain  . Vascular Access Problem     (Consider location/radiation/quality/duration/timing/severity/associated sxs/prior Treatment) HPI Presents for hemodialysis. Patient gets dialysis 3 times weekly. She is presently asymptomatic except for feeling of fluid in her abdomen which she's had for many months. She periodically gets paracentesis though presently denies abdominal pain and denies shortness of breath. No treatment prior to coming here. Past Medical History  Diagnosis Date  . 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 Hypertension   . ESRD on hemodialysis 04/20/2014    Patient started HD in 1998.  She has been dialyzed in [place] at "[facility]" until moving to [place] in July 2015 to live with family.  She has had failed accesses in the L arm.  No attempt to place access was made in the R arm, patient is not sure why.  She has a L thigh AVG which she says has been functional for 7-8 years.  They stopped giving her heparin several years ago due to prolonged access bleeding.    Marland Kitchen. Heart murmur   . Anemia of chronic disease   . History of blood transfusion     "several; w/transplant, infections, etc"  . Headache(784.0)     "often; sometimes daily" (04/26/2014)  . Migraines     "qod" (04/26/2014)  . ESRD on hemodialysis 04/20/2014    Patient started HD in 1998.  She has been dialyzed in [place] at "[facility]" until moving to [place] in July 2015 to live with family.  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  She has had failed accesses in the L arm.  No attempt to place access was made in the R arm, patient is not sure why.  She has a L thigh AVG which she says has been functional for   Past Surgical History  Procedure Laterality Date  . Arteriovenous graft placement Left 2013    thigh   . 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 Nephrectomy transplanted organ  2013   No family history on file. History  Substance Use Topics  . Smoking status: Never Smoker   . Smokeless tobacco: Never Used  . Alcohol Use: No   OB History   Grav Para Term Preterm Abortions TAB SAB Ect Mult Living                 Review of Systems  Constitutional: Negative.   HENT: Negative.   Respiratory: Negative.   Cardiovascular: Negative.   Gastrointestinal: Positive for abdominal distention.  Musculoskeletal: Negative.   Skin: Negative.   Neurological: Negative.   Psychiatric/Behavioral: Negative.   All other systems reviewed and are negative.     Allergies  Contrast media; Procardia; and Vancomycin  Home Medications   Prior to Admission medications   Medication Sig Start Date End Date Taking? Authorizing Provider  calcium acetate (PHOSLO) 667 MG capsule Take 1,334 mg by mouth 3 (three) times daily with meals.   Yes Historical Provider, MD  cloNIDine (CATAPRES) 0.3 MG tablet Take 0.3 mg by mouth 2 (two) times daily.   Yes Historical Provider, MD  Skin Protectants, Misc. (EUCERIN) cream Apply 1 application topically daily as needed for dry skin.  Yes Historical Provider, MD   BP 177/94  Pulse 94  Temp(Src) 97.9 F (36.6 C) (Oral)  Resp 18  SpO2 99% Physical Exam  Nursing note and vitals reviewed. Constitutional: She appears well-developed and well-nourished.  HENT:  Head: Normocephalic and atraumatic.  Eyes: Conjunctivae are normal. Pupils are equal, round, and reactive to light.  Neck: Neck supple. No tracheal deviation present. No thyromegaly present.  Cardiovascular: Normal rate and regular rhythm.   No murmur heard. Pulmonary/Chest: Effort normal and breath sounds normal.  Abdominal: Soft. Bowel sounds are normal. She exhibits no distension. There is no tenderness.  Musculoskeletal: Normal range of motion. She exhibits  no edema and no tenderness.  Lower extremity with dialysis graft that time with good thrill  Neurological: She is alert. Coordination normal.  Skin: Skin is warm and dry. No rash noted.  Psychiatric: She has a normal mood and affect.    ED Course  Procedures (including critical care time) Labs Review Labs Reviewed - No data to display  Imaging Review No results found.   EKG Interpretation None      MDM  Spoke with Dr.Schertz. Patient will go directly to hemodialysis from here Diagnosis end-stage renal disease Final diagnoses:  None        Doug Sou, MD 05/19/14 1339

## 2014-05-19 NOTE — Procedures (Addendum)
I was present at this dialysis session, have reviewed the session itself and made  appropriate changes Ordered for 4 hours HD and 4kg off, so far tolerating well.  K + better at 5.5, using 1K bath for 45 min then 2K.  She asked about something like "EPO" to keep her Hb up, it is down from 11's to 9's now, i recommended ARanesp, she wants to think about it.   Vinson Moselle MD (pgr) [phone]    [phone] 05/19/2014, 5:06 PM

## 2014-05-19 NOTE — Progress Notes (Signed)
Hemodialysis- Complete without issue. Pt signed off AMA with 30 minutes remaining. 3.6L uf total. Tolerated well. No complaints. Discharged to home per order in stable condition. Pt taken to ED via wheelchair to get cab.

## 2014-05-19 NOTE — ED Notes (Addendum)
Pt presents to department requesting hemodialysis and paracentesis. Pt states abdominal distention and bloating. Fistula noted to L thigh. Pt is alert and oriented x4. NAD.

## 2014-05-19 NOTE — ED Notes (Signed)
MD at bedside. 

## 2014-05-19 NOTE — ED Notes (Addendum)
Spoke with dialysis. States that they are ready for pt and to call when we are bringing her upstairs.

## 2014-05-22 ENCOUNTER — Encounter (HOSPITAL_COMMUNITY): Payer: Self-pay | Admitting: Emergency Medicine

## 2014-05-22 ENCOUNTER — Non-Acute Institutional Stay (HOSPITAL_COMMUNITY)
Admission: EM | Admit: 2014-05-22 | Discharge: 2014-05-22 | Disposition: A | Discharge status: Home / Self Care | Payer: Medicare Other | Attending: Emergency Medicine | Admitting: Emergency Medicine

## 2014-05-22 DIAGNOSIS — I120 Hypertensive chronic kidney disease with stage 5 chronic kidney disease or end stage renal disease: Secondary | ICD-10-CM | POA: Diagnosis not present

## 2014-05-22 DIAGNOSIS — N186 End stage renal disease: Secondary | ICD-10-CM

## 2014-05-22 DIAGNOSIS — D631 Anemia in chronic kidney disease: Secondary | ICD-10-CM | POA: Diagnosis not present

## 2014-05-22 DIAGNOSIS — Z992 Dependence on renal dialysis: Secondary | ICD-10-CM | POA: Diagnosis not present

## 2014-05-22 DIAGNOSIS — E8770 Fluid overload, unspecified: Secondary | ICD-10-CM | POA: Diagnosis present

## 2014-05-22 DIAGNOSIS — N039 Chronic nephritic syndrome with unspecified morphologic changes: Secondary | ICD-10-CM

## 2014-05-22 LAB — BASIC METABOLIC PANEL
ANION GAP: 16 — AB (ref 5–15)
BUN: 65 mg/dL — ABNORMAL HIGH (ref 6–23)
CALCIUM: 8 mg/dL — AB (ref 8.4–10.5)
CO2: 27 mEq/L (ref 19–32)
Chloride: 95 mEq/L — ABNORMAL LOW (ref 96–112)
Creatinine, Ser: 10.62 mg/dL — ABNORMAL HIGH (ref 0.50–1.10)
GFR calc Af Amer: 5 mL/min — ABNORMAL LOW (ref >90)
GFR calc non Af Amer: 4 mL/min — ABNORMAL LOW (ref >90)
Glucose, Bld: 110 mg/dL — ABNORMAL HIGH (ref 70–99)
Potassium: 5.4 mEq/L — ABNORMAL HIGH (ref 3.7–5.3)
SODIUM: 138 meq/L (ref 137–147)

## 2014-05-22 LAB — CBC WITH DIFFERENTIAL/PLATELET
Basophils Absolute: 0 10*3/uL (ref 0.0–0.1)
Basophils Relative: 1 % (ref 0–1)
EOS PCT: 4 % (ref 0–5)
Eosinophils Absolute: 0.2 10*3/uL (ref 0.0–0.7)
HCT: 29.9 % — ABNORMAL LOW (ref 36.0–46.0)
Hemoglobin: 9.4 g/dL — ABNORMAL LOW (ref 12.0–15.0)
LYMPHS PCT: 14 % (ref 12–46)
Lymphs Abs: 0.9 10*3/uL (ref 0.7–4.0)
MCH: 28.1 pg (ref 26.0–34.0)
MCHC: 31.4 g/dL (ref 30.0–36.0)
MCV: 89.3 fL (ref 78.0–100.0)
Monocytes Absolute: 0.4 10*3/uL (ref 0.1–1.0)
Monocytes Relative: 7 % (ref 3–12)
NEUTROS PCT: 74 % (ref 43–77)
Neutro Abs: 4.9 10*3/uL (ref 1.7–7.7)
PLATELETS: 170 10*3/uL (ref 150–400)
RBC: 3.35 MIL/uL — ABNORMAL LOW (ref 3.87–5.11)
RDW: 13.2 % (ref 11.5–15.5)
WBC: 6.5 10*3/uL (ref 4.0–10.5)

## 2014-05-22 NOTE — Progress Notes (Signed)
ED CM spoke with patient regarding establishing care with a PCP. Pt presented to [facility] ED with fluid overload in need of dialysis. Pt has recently relocated to the [place].  Patient still awaits an assignment of HD center. Pt reports attempted to find a PCP but, most not accepting new patients at this time. Offered to assist patient with finding a PCP Discussed [facility] and services. Pt verbalized understanding and is agreeable.  Provided [facility] brochure with address, phone number and directions. Pt instructed to walk in Thursday morning at 9a to establish care, explained first come first serve basis. Pt verbalized understanding teach back was done. ED CM will notify the clinic. No further ED CM needs at this time.

## 2014-05-22 NOTE — ED Notes (Signed)
Pt presents to department for evaluation of hemodialysis. Pt states she is due for treatment today. Denies pain. Pt is alert and oriented x4. NAD.

## 2014-05-22 NOTE — ED Provider Notes (Addendum)
CSN: [id number]     Arrival date & time 05/22/14  1433 History   First MD Initiated Contact with Patient 05/22/14 1841     Chief Complaint  Patient presents with  . Vascular Access Problem     (Consider location/radiation/quality/duration/timing/severity/associated sxs/prior Treatment) HPI Comments: Pt with ESRD on HD T/Tr/Sat, Anemia, HTN comes in with for dialysis. Pt denies any chest pain, dib, n/v/f/c. Pt's last dialysis was on Sat.  The history is provided by the patient.    Past Medical History  Diagnosis Date  . 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 Hypertension   . ESRD on hemodialysis 04/20/2014    Patient started HD in 1998.  She has been dialyzed in [place] at "[facility]" until moving to [place] in July 2015 to live with family.  She has had failed accesses in the L arm.  No attempt to place access was made in the R arm, patient is not sure why.  She has a L thigh AVG which she says has been functional for 7-8 years.  They stopped giving her heparin several years ago due to prolonged access bleeding.    Marland Kitchen. Heart murmur   . Anemia of chronic disease   . History of blood transfusion     "several; w/transplant, infections, etc"  . Headache(784.0)     "often; sometimes daily" (04/26/2014)  . Migraines     "qod" (04/26/2014)  . ESRD on hemodialysis 04/20/2014    Patient started HD in 1998.  She has been dialyzed in [place] at "[facility]" until moving to [place] in July 2015 to live with family.  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  She has had failed accesses in the L arm.  No attempt to place access was made in the R arm, patient is not sure why.  She has a L thigh AVG which she says has been functional for   Past Surgical History  Procedure Laterality Date  . Arteriovenous graft placement Left 2013    thigh  . Kidney transplant Right 2012    failed and new kidney removed as body did not  accept  . Nephrectomy transplanted organ  2013   No family history on file. History  Substance Use Topics  . Smoking status: Never Smoker   . Smokeless tobacco: Never Used  . Alcohol Use: No   OB History   Grav Para Term Preterm Abortions TAB SAB Ect Mult Living                 Review of Systems  Constitutional: Negative for activity change.  Respiratory: Negative for chest tightness and shortness of breath.   Cardiovascular: Negative for chest pain.  Gastrointestinal: Negative for nausea.  Psychiatric/Behavioral: Negative for confusion.      Allergies  Contrast media; Procardia; and Vancomycin  Home Medications   Prior to Admission medications   Medication Sig Start Date End Date Taking? Authorizing Provider  calcium acetate (PHOSLO) 667 MG capsule Take 1,334 mg by mouth 3 (three) times daily with meals.   Yes Historical Provider, MD  cloNIDine (CATAPRES) 0.3 MG tablet Take 0.3 mg by mouth 2 (two) times daily.   Yes Historical Provider, MD  Skin Protectants, Misc. (EUCERIN) cream Apply 1 application topically daily as needed for dry skin.    Yes Historical Provider, MD   BP 179/106  Pulse 99  Temp(Src) 98.6 F (37  C) (Oral)  Resp 17  SpO2 94% Physical Exam  Nursing note and vitals reviewed. Constitutional: She appears well-developed.  HENT:  Head: Normocephalic.  Eyes: Conjunctivae are normal.  Neck: Neck supple.  Cardiovascular: Normal rate.   Murmur heard. Pulmonary/Chest: Effort normal.    ED Course  Procedures (including critical care time) Labs Review Labs Reviewed  CBC WITH DIFFERENTIAL - Abnormal; Notable for the following:    RBC 3.35 (*)    Hemoglobin 9.4 (*)    HCT 29.9 (*)    All other components within normal limits  BASIC METABOLIC PANEL - Abnormal; Notable for the following:    Potassium 5.4 (*)    Chloride 95 (*)    Glucose, Bld 110 (*)    BUN 65 (*)    Creatinine, Ser 10.62 (*)    Calcium 8.0 (*)    GFR calc non Af Amer 4 (*)     GFR calc Af Amer 5 (*)    Anion gap 16 (*)    All other components within normal limits    Imaging Review No results found.   EKG Interpretation   Date/Time:  Tuesday May 22 2014 19:36:42 EDT Ventricular Rate:  88 PR Interval:  138 QRS Duration: 144 QT Interval:  418 QTC Calculation: 506 R Axis:   92 Text Interpretation:  Sinus rhythm RBBB and LPFB No acute findings  Confirmed by Rhunette Croft, MD, Janey Genta [phone]) on 05/22/2014 8:25:17 PM      MDM   Final diagnoses:  ESRD (end stage renal disease) on dialysis    Pt comes in for her regular HD. Dr. Allena Katz from Nephrology to help with the dialysis. Labs look normal, no emergent indication.  Derwood Kaplan, MD 05/22/14 2028  Derwood Kaplan, MD 05/22/14 2043  9:19 PM Dr. Allena Katz asked if patient was OK coming in tomorrow  8 am. Pt is comfortable with that plan. Will d.c.  Derwood Kaplan, MD 05/22/14 2119

## 2014-05-22 NOTE — ED Notes (Signed)
Pt left prior to receiving discharge paperwork.  RN unaware pt was leaving until pt was seen leaving by ED staff.  Per EDP, pt notified of plan to return tomorrow for dialysis.  Pt verbalized understanding.

## 2014-05-22 NOTE — Discharge Instructions (Signed)
Please come tomorrow at 7:45 am to the ER for dialysis.   Dialysis Dialysis is a procedure that replaces some of the work healthy kidneys do. It is done when you lose about 85-90% of your kidney function. It may also be done earlier if your symptoms may be improved by dialysis. During dialysis, wastes, salt, and extra water are removed from the blood, and the levels of certain chemicals in the blood (such as potassium) are maintained. Dialysis is done in sessions. Dialysis sessions are continued until the kidneys get better. If the kidneys cannot get better, such as in end-stage kidney disease, dialysis is continued for life or until you receive a new kidney (kidney transplant). There are two types of dialysis: hemodialysis and peritoneal dialysis. WHAT IS HEMODIALYSIS?  Hemodialysis is a type of dialysis in which a machine called a dialyzer is used to filter the blood. Before beginning hemodialysis, you will have surgery to create a site where blood can be removed from the body and returned to the body (vascular access). There are three types of vascular accesses:  Arteriovenous fistula. To create this type of access, an artery is connected to a vein (usually in the arm). A fistula takes 1-6 months to develop after surgery. If it develops properly, it usually lasts longer than the other types of vascular accesses. It is also less likely to become infected and cause blood clots.  Arteriovenous graft. To create this type of access, an artery and a vein in the arm are connected with a tube. A graft may be used within 2-3 weeks of surgery.  A venous catheter. To create this type of access, a thin, flexible tube (catheter) is placed in a large vein in your neck, chest, or groin. A catheter may be used right away. It is usually used as a temporary access when dialysis needs to begin immediately. During hemodialysis, blood leaves the body through your access. It travels through a tube to the dialyzer, where  it is filtered. The blood then returns to your body through another tube. Hemodialysis is usually performed by a health care provider at a hospital or dialysis center three times a week. Visits last about 3-4 hours. It may also be performed with the help of another person at home with training.  WHAT IS PERITONEAL DIALYSIS? Peritoneal dialysis is a type of dialysis in which the thin lining of the abdomen (peritoneum) is used as a filter. Before beginning peritoneal dialysis, you will have surgery to place a catheter in your abdomen. The catheter will be used to transfer a fluid called dialysate to and from your abdomen. At the start of a session, your abdomen is filled with dialysate. During the session, wastes, salt, and extra water in the blood pass through the peritoneum and into the dialysate. The dialysate is drained from the body at the end of the session. The process of filling and draining the dialysate is called an exchange. Exchanges are repeated until you have used up all the dialysate for the day. Peritoneal dialysis may be performed by you at home or at almost any other location. It is done every day. You may need up to five exchanges a day. The amount of time the dialysate is in your body between exchanges is called a dwell. The dwell depends on the number of exchanges needed and the characteristics of the peritoneum. It usually varies from 1.5-3 hours. You may go about your day normally between exchanges. Alternately, the exchanges may be done  at night while you sleep, using a machine called a cycler. WHICH TYPE OF DIALYSIS SHOULD I CHOOSE?  Both hemodialysis and peritoneal dialysis have advantages and disadvantages. Talk to your health care provider about which type of dialysis would be best for you. Your lifestyle and preferences should be considered along with your medical condition. In some cases, only one type of dialysis may be an option.  Advantages of hemodialysis  It is done less  often than peritoneal dialysis.  Someone else can do the dialysis for you.  If you go to a dialysis center, your health care provider will be able to recognize any problems right away.  If you go to a dialysis center, you can interact with others who are having dialysis. This can provide you with emotional support. Disadvantages of hemodialysis  Hemodialysis may cause cramps and low blood pressure. It may leave you feeling tired on the days you have the treatment.  If you go to a dialysis center, you will need to make weekly appointments and work around the center's schedule.  You will need to take extra care when traveling. If you go to a dialysis center, you will need to make special arrangements to visit a dialysis center near your destination. If you are having treatments at home, you will need to take the dialyzer with you to your destination.  You will need to avoid more foods than you would need to avoid on peritoneal dialysis. Advantages of peritoneal dialysis  It is less likely than hemodialysis to cause cramps and low blood pressure.  You may do exchanges on your own wherever you are, including when you travel.  You do not need to avoid as many foods as you do on hemodialysis. Disadvantages of peritoneal dialysis  It is done more often than hemodialysis.  Performing peritoneal dialysis requires you to have dexterity of the hands. You must also be able to lift bags.  You will have to learn sterilization techniques. You will need to practice them every day to reduce the risk of infection. WHAT CHANGES WILL I NEED TO MAKE TO MY DIET DURING DIALYSIS? Both hemodialysis and peritoneal dialysis require you to make some changes to your diet. For example, you will need to limit your intake of foods high in the minerals phosphorus and potassium. You will also need to limit your fluid intake. Your dietitian can help you plan meals. A good meal plan can improve your dialysis and your  health.  WHAT SHOULD I EXPECT WHEN BEGINNING DIALYSIS? Adjusting to the dialysis treatment, schedule, and diet can take some time. You may need to stop working and may not be able to do some of the things you normally do. You may feel anxious or depressed when beginning dialysis. Eventually, many people feel better overall because of dialysis. Some people are able to return to work after making some changes, such as reducing work intensity. WHERE CAN I FIND MORE INFORMATION?   National Kidney Foundation: www.kidney.org  American Association of Kidney Patients: ResidentialShow.iswww.aakp.org  American Kidney Fund: www.kidneyfund.org Document Released: 12/26/2002 Document Revised: 02/19/2014 Document Reviewed: 11/29/2012 [facility] Patient Information 2015 [employer] [place]. This information is not intended to replace advice given to you by your health care provider. Make sure you discuss any questions you have with your health care provider.

## 2014-05-22 NOTE — ED Notes (Signed)
Pt here for dialysis.  Pt still awaiting placement for outpatient dialysis. No new complaints.

## 2014-05-22 NOTE — ED Notes (Signed)
Dinner tray ordered for pt

## 2014-05-23 ENCOUNTER — Emergency Department (HOSPITAL_COMMUNITY)
Admission: EM | Admit: 2014-05-23 | Discharge: 2014-05-23 | Disposition: A | Discharge status: Home / Self Care | Payer: Medicare Other | Attending: Emergency Medicine | Admitting: Emergency Medicine

## 2014-05-23 ENCOUNTER — Encounter (HOSPITAL_COMMUNITY): Payer: Self-pay | Admitting: Emergency Medicine

## 2014-05-23 DIAGNOSIS — R011 Cardiac murmur, unspecified: Secondary | ICD-10-CM | POA: Insufficient documentation

## 2014-05-23 DIAGNOSIS — Z79899 Other long term (current) drug therapy: Secondary | ICD-10-CM | POA: Diagnosis not present

## 2014-05-23 DIAGNOSIS — Z862 Personal history of diseases of the blood and blood-forming organs and certain disorders involving the immune mechanism: Secondary | ICD-10-CM | POA: Insufficient documentation

## 2014-05-23 DIAGNOSIS — R6889 Other general symptoms and signs: Secondary | ICD-10-CM | POA: Insufficient documentation

## 2014-05-23 DIAGNOSIS — I120 Hypertensive chronic kidney disease with stage 5 chronic kidney disease or end stage renal disease: Secondary | ICD-10-CM | POA: Diagnosis not present

## 2014-05-23 DIAGNOSIS — Z9889 Other specified postprocedural states: Secondary | ICD-10-CM | POA: Diagnosis not present

## 2014-05-23 DIAGNOSIS — N186 End stage renal disease: Secondary | ICD-10-CM | POA: Insufficient documentation

## 2014-05-23 DIAGNOSIS — Z992 Dependence on renal dialysis: Secondary | ICD-10-CM | POA: Insufficient documentation

## 2014-05-23 LAB — CBC WITH DIFFERENTIAL/PLATELET
Basophils Absolute: 0 10*3/uL (ref 0.0–0.1)
Basophils Relative: 0 % (ref 0–1)
Eosinophils Absolute: 0.2 10*3/uL (ref 0.0–0.7)
Eosinophils Relative: 4 % (ref 0–5)
HCT: 29.5 % — ABNORMAL LOW (ref 36.0–46.0)
Hemoglobin: 9.5 g/dL — ABNORMAL LOW (ref 12.0–15.0)
Lymphocytes Relative: 11 % — ABNORMAL LOW (ref 12–46)
Lymphs Abs: 0.7 10*3/uL (ref 0.7–4.0)
MCH: 29.4 pg (ref 26.0–34.0)
MCHC: 32.2 g/dL (ref 30.0–36.0)
MCV: 91.3 fL (ref 78.0–100.0)
Monocytes Absolute: 0.7 10*3/uL (ref 0.1–1.0)
Monocytes Relative: 11 % (ref 3–12)
Neutro Abs: 4.4 10*3/uL (ref 1.7–7.7)
Neutrophils Relative %: 74 % (ref 43–77)
Platelets: 159 10*3/uL (ref 150–400)
RBC: 3.23 MIL/uL — ABNORMAL LOW (ref 3.87–5.11)
RDW: 13.2 % (ref 11.5–15.5)
WBC: 6 10*3/uL (ref 4.0–10.5)

## 2014-05-23 LAB — BASIC METABOLIC PANEL
Anion gap: 19 — ABNORMAL HIGH (ref 5–15)
BUN: 70 mg/dL — ABNORMAL HIGH (ref 6–23)
CALCIUM: 7.8 mg/dL — AB (ref 8.4–10.5)
CO2: 23 mEq/L (ref 19–32)
Chloride: 98 mEq/L (ref 96–112)
Creatinine, Ser: 11 mg/dL — ABNORMAL HIGH (ref 0.50–1.10)
GFR calc Af Amer: 4 mL/min — ABNORMAL LOW (ref >90)
GFR, EST NON AFRICAN AMERICAN: 4 mL/min — AB (ref >90)
GLUCOSE: 90 mg/dL (ref 70–99)
Potassium: 5.8 mEq/L — ABNORMAL HIGH (ref 3.7–5.3)
SODIUM: 140 meq/L (ref 137–147)

## 2014-05-23 MED ORDER — DARBEPOETIN ALFA-POLYSORBATE 100 MCG/0.5ML IJ SOLN
INTRAMUSCULAR | Status: AC
  Filled 2014-05-23: qty 0.5

## 2014-05-23 MED ORDER — DARBEPOETIN ALFA-POLYSORBATE 100 MCG/0.5ML IJ SOLN: 100.0000 ug | INTRAMUSCULAR | Status: DC

## 2014-05-23 NOTE — Procedures (Signed)
Pt seen on HD  Ap 100 Vp 200.  Will give dose of aranesp.  Says she is working on getting an outpt spot at [facility].

## 2014-05-23 NOTE — Progress Notes (Signed)
05/23/2014 3:35 PM Hemodialysis Outpatient Note;this patient has been accepted at the [facility] center on a Monday, Wednesday and Friday 2nd shift schedule. I called the patient at home and gave her the schedule, address and phone number to the unit. She was very appreciative and understood what she needed to do. Thank you. Tilman Neat

## 2014-05-23 NOTE — ED Provider Notes (Signed)
CSN: [id number]     Arrival date & time 05/23/14  0751 History   First MD Initiated Contact with Patient 05/23/14 [phone]     Chief Complaint  Patient presents with  . Abnormal Lab     (Consider location/radiation/quality/duration/timing/severity/associated sxs/prior Treatment) HPI Comments: Patient is a 43 year old female with history of end-stage renal disease on dialysis, hypertension who presents the emergency department for dialysis. She reports that the emergency room is always there she comes for dialysis. She dialyzes Tuesday, Thursday, Saturday. She last dialyzed on Saturday. She was seen here yesterday. At that time she was instructed that she was safe to go home and to return to the emergency department at 8 AM for dialysis. She does not have a dialysis center to go to. When asked if she is working on it she states "they are working on it, not me". She denies any chest pain, shortness of breath, nausea, vomiting abdominal pain.  The history is provided by the patient. No language interpreter was used.    Past Medical History  Diagnosis Date  . 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 Hypertension   . ESRD on hemodialysis 04/20/2014    Patient started HD in 1998.  She has been dialyzed in [place] at "[facility]" until moving to [place] in July 2015 to live with family.  She has had failed accesses in the L arm.  No attempt to place access was made in the R arm, patient is not sure why.  She has a L thigh AVG which she says has been functional for 7-8 years.  They stopped giving her heparin several years ago due to prolonged access bleeding.    Marland Kitchen. Heart murmur   . Anemia of chronic disease   . History of blood transfusion     "several; w/transplant, infections, etc"  . Headache(784.0)     "often; sometimes daily" (04/26/2014)  . Migraines     "qod" (04/26/2014)  . ESRD on hemodialysis 04/20/2014    Patient started HD in 1998.  She has been dialyzed  in [place] at "[facility]" until moving to [place] in July 2015 to live with family.  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  She has had failed accesses in the L arm.  No attempt to place access was made in the R arm, patient is not sure why.  She has a L thigh AVG which she says has been functional for   Past Surgical History  Procedure Laterality Date  . Arteriovenous graft placement Left 2013    thigh  . 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 Nephrectomy transplanted organ  2013   No family history on file. History  Substance Use Topics  . Smoking status: Never Smoker   . Smokeless tobacco: Never Used  . Alcohol Use: No   OB History   Grav Para Term Preterm Abortions TAB SAB Ect Mult Living                 Review of Systems  Constitutional: Negative for fever and chills.  Respiratory: Negative for shortness of breath.   Cardiovascular: Negative for chest pain and palpitations.  All other systems reviewed and are negative.     Allergies  Contrast media; Procardia; and Vancomycin  Home Medications   Prior to Admission medications   Medication  Sig Start Date End Date Taking? Authorizing Provider  calcium acetate (PHOSLO) 667 MG capsule Take 1,334 mg by mouth 3 (three) times daily with meals.   Yes Historical Provider, MD  cloNIDine (CATAPRES) 0.3 MG tablet Take 0.3 mg by mouth 2 (two) times daily.   Yes Historical Provider, MD  Skin Protectants, Misc. (EUCERIN) cream Apply 1 application topically daily as needed for dry skin.    Yes Historical Provider, MD   BP 174/90  Pulse 88  Temp(Src) 97.9 F (36.6 C) (Oral)  Resp 18  Ht 5\' 9"  (1.753 m)  Wt 177 lb 4 oz (80.4 kg)  BMI 26.16 kg/m2  SpO2 99% Physical Exam  Nursing note and vitals reviewed. Constitutional: She is oriented to person, place, and time. She appears well-developed and well-nourished. No distress.  HENT:   Head: Normocephalic and atraumatic.  Right Ear: External ear normal.  Left Ear: External ear normal.  Nose: Nose normal.  Mouth/Throat: Oropharynx is clear and moist.  Eyes: Conjunctivae are normal.  Neck: Normal range of motion.  Cardiovascular: Normal rate and regular rhythm.   Murmur heard. Pulmonary/Chest: Effort normal and breath sounds normal. No stridor. No respiratory distress. She has no wheezes. She has no rales.  Abdominal: Soft. She exhibits no distension.  Musculoskeletal: Normal range of motion.  Neurological: She is alert and oriented to person, place, and time. She has normal strength.  Skin: Skin is warm and dry. She is not diaphoretic. No erythema.  Psychiatric: She has a normal mood and affect. Her behavior is normal.    ED Course  Procedures (including critical care time) Labs Review Labs Reviewed  CBC WITH DIFFERENTIAL - Abnormal; Notable for the following:    RBC 3.23 (*)    Hemoglobin 9.5 (*)    HCT 29.5 (*)    Lymphocytes Relative 11 (*)    All other components within normal limits  BASIC METABOLIC PANEL - Abnormal; Notable for the following:    Potassium 5.8 (*)    BUN 70 (*)    Creatinine, Ser 11.00 (*)    Calcium 7.8 (*)    GFR calc non Af Amer 4 (*)    GFR calc Af Amer 4 (*)    Anion gap 19 (*)    All other components within normal limits    Imaging Review No results found.   EKG Interpretation   Date/Time:  Wednesday May 23 2014 08:43:01 EDT Ventricular Rate:  94 PR Interval:  142 QRS Duration: 149 QT Interval:  421 QTC Calculation: 526 R Axis:   87 Text Interpretation:  Sinus rhythm Right bundle branch block No  significant change since last tracing Confirmed by ZACKOWSKI  MD, SCOTT  [phone]) on 05/23/2014 8:47:31 AM      MDM   Final diagnoses:  ESRD on dialysis    Patient presents emergency department for dialysis. She is not currently established at dialysis center. Patient has no physical complaints at this time. EKG is  unchanged from prior. The patient has been taken to dialysis. Vital signs stable.    Mora Bellman, PA-C 05/23/14 [phone]

## 2014-05-23 NOTE — Progress Notes (Signed)
Hemodialysis- Pt signed off treatment AMA with 1 hour and 49 minutes remaining. Refused dose of Aranesp d/t going to church and "ive never had that before." Gave patient handout on medication and explained benefit. Pt states she will "try that on my next visit. " Dressings applied to site. Vitals stable, pt discharged to home.

## 2014-05-23 NOTE — ED Notes (Signed)
Seen in ED one day ago suppose to have outpatient dialysis however dialysis center was full. Instructed to come back to ED for dialysis. States last dialysis Saturday. Normal routine currently Tuesday, Thursday, and Saturday.

## 2014-05-25 NOTE — ED Provider Notes (Signed)
Medical screening examination/treatment/procedure(s) were performed by non-physician practitioner and as supervising physician I was immediately available for consultation/collaboration.   EKG Interpretation   Date/Time:  Wednesday May 23 2014 08:43:01 EDT Ventricular Rate:  94 PR Interval:  142 QRS Duration: 149 QT Interval:  421 QTC Calculation: 526 R Axis:   87 Text Interpretation:  Sinus rhythm Right bundle branch block No  significant change since last tracing Confirmed by Kamauri Kathol  MD, Duha Abair  ([id number]) on 05/23/2014 8:47:31 AM        Vanetta Mulders, MD 05/25/14 [phone]

## 2014-06-14 ENCOUNTER — Telehealth (HOSPITAL_COMMUNITY): Payer: Self-pay | Admitting: Vascular Surgery

## 2014-06-14 ENCOUNTER — Institutional Professional Consult (permissible substitution): Payer: Medicare Other | Admitting: Pulmonary Disease

## 2014-07-03 ENCOUNTER — Encounter: Payer: Self-pay | Admitting: Emergency Medicine

## 2014-07-03 ENCOUNTER — Ambulatory Visit (INDEPENDENT_AMBULATORY_CARE_PROVIDER_SITE_OTHER): Payer: Medicare Other | Admitting: Emergency Medicine

## 2014-07-03 VITALS — BP 132/80 | HR 82 | Temp 98.0°F | Ht 69.5 in | Wt 176.2 lb

## 2014-07-03 DIAGNOSIS — I2789 Other specified pulmonary heart diseases: Secondary | ICD-10-CM

## 2014-07-03 DIAGNOSIS — K766 Portal hypertension: Principal | ICD-10-CM

## 2014-07-03 DIAGNOSIS — I2721 Secondary pulmonary arterial hypertension: Secondary | ICD-10-CM | POA: Insufficient documentation

## 2014-07-03 NOTE — Patient Instructions (Signed)
We will obtain your records from [place]  We will perform lab work at HD tomorrow.  Follow with Dr Delton Coombes in 1 month to review the results and the information from [place].

## 2014-07-03 NOTE — Assessment & Plan Note (Signed)
Her PAH is almost certainly due to her hx of systemic HTN, renal failure, valvular disease. Possibly also contributions of her prior medical regimens for her transplant? She is off these medications now. No other obvious exposures. She is unsure whether she has been evaluated for VTE, but thinks this may have been done in [place].  - will obtain all records - will start the auto-immune w/u - hold off on V/q scan for now but consider - she could have some degree of primary PAH but I doubt given the clinical hx. Will consider R heart cath and meds in the future depending on our other eval .

## 2014-07-03 NOTE — Progress Notes (Signed)
Subjective:    Patient ID: Holly Mendoza, female    DOB: 16-Sep-1971, 43 y.o.   MRN: [id number]  HPI 43 yo woman, hx HTN and ESRD s/p failed transplant 2012, currently on HD three times a week. She was also getting weekly paracentesis in [place] before moving to [place] (for about 1-2 years). She is followed by Dr Lewis Moccasin.  TTE 05/01/14 w moderate LVH, MV thickening and prolapse, estimated peak PASP . She is referred regarding her PAH and volume status.  She denies any resting SOB, but her exertional tolerance is very poor. She relates this to abdominal weight, girth. She has never smoked, has not done inhaled drugs. Has lived in [place], [place], [place]. Her grandmother has owned a [employer], but not recently. She took dexatrim when she was younger, no other diet pills. Her BP is moderately well controlled on clonidine - varies a lot pre- and post-HD. Her HIV in July '15 was negative.    Review of Systems  Constitutional: Positive for appetite change and unexpected weight change. Negative for fever.  HENT: Positive for trouble swallowing. Negative for congestion, dental problem, ear pain, nosebleeds, postnasal drip, rhinorrhea, sinus pressure, sneezing and sore throat.   Eyes: Negative for redness and itching.  Respiratory: Negative for cough, chest tightness, shortness of breath and wheezing.   Cardiovascular: Positive for palpitations. Negative for leg swelling.  Gastrointestinal: Positive for abdominal pain. Negative for nausea and vomiting.       Acid heartburn/Indigestion  Genitourinary: Negative for dysuria.  Musculoskeletal: Negative for joint swelling.  Skin: Negative for rash.  Neurological: Negative for headaches.  Hematological: Does not bruise/bleed easily.  Psychiatric/Behavioral: Negative for dysphoric mood. The patient is not nervous/anxious.     Past Medical History  Diagnosis Date  . 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 Hypertension   . ESRD on  hemodialysis 04/20/2014    Patient started HD in 1998.  She has been dialyzed in [place] at "[facility]" until moving to [place] in July 2015 to live with family.  She has had failed accesses in the L arm.  No attempt to place access was made in the R arm, patient is not sure why.  She has a L thigh AVG which she says has been functional for 7-8 years.  They stopped giving her heparin several years ago due to prolonged access bleeding.    Marland Kitchen Heart murmur   . Anemia of chronic disease   . History of blood transfusion     "several; w/transplant, infections, etc"  . Headache(784.0)     "often; sometimes daily" (04/26/2014)  . Migraines     "qod" (04/26/2014)  . ESRD on hemodialysis 04/20/2014    Patient started HD in 1998.  She has been dialyzed in [place] at "[facility]" until moving to [place] in July 2015 to live with family.  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  She has had failed accesses in the L arm.  No attempt to place access was made in the R arm, patient is not sure why.  She has a L thigh AVG which she says has been functional for     Family History  Problem Relation Age of Onset  . Allergies Maternal Grandmother      History   Social History  . Marital Status: Single    Spouse Name: N/A    Number of Children: N/A  .  Years of Education: N/A   Occupational History  . Umemployed    Social History Main Topics  . Smoking status: Never Smoker   . Smokeless tobacco: Never Used  . Alcohol Use: No  . Drug Use: No  . Sexual Activity: No   Other Topics Concern  . Not on file   Social History Narrative  . No narrative on file     Allergies  Allergen Reactions  . Contrast Media [Iodinated Diagnostic Agents] Anaphylaxis  . Procardia [Nifedipine] Other (See Comments)    Sweating, BP drops dangerously low,  . Vancomycin Anaphylaxis and Hives     Outpatient Prescriptions Prior to Visit  Medication Sig Dispense Refill  .  calcium acetate (PHOSLO) 667 MG capsule Take 1,334 mg by mouth 3 (three) times daily with meals.      . cloNIDine (CATAPRES) 0.3 MG tablet Take 0.3 mg by mouth 2 (two) times daily.      . Skin Protectants, Misc. (EUCERIN) cream Apply 1 application topically daily as needed for dry skin.        No facility-administered medications prior to visit.         Objective:   Physical Exam Filed Vitals:   07/03/14 1440  BP: 132/80  Pulse: 82  Temp: 98 F (36.7 C)  TempSrc: Oral  Height: 5' 9.5" (1.765 m)  Weight: 176 lb 3.2 oz (79.924 kg)  SpO2: 98%   Gen: Pleasant, well-nourished, in no distress,  normal affect  ENT: No lesions,  mouth clear,  oropharynx clear, no postnasal drip  Neck: No JVD, no TMG, no carotid bruits  Lungs: No use of accessory muscles, clear without rales or rhonchi  Cardiovascular: RRR, heart sounds normal, no murmur or gallops, trace LE peripheral edema  Abdomen: distended but soft with ascites, + BS  Musculoskeletal: No deformities, no cyanosis or clubbing  Neuro: alert, non focal  Skin: Warm, no lesions or rashes        Assessment & Plan:  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 Possibly also contributions of her prior medical regimens for her transplant? She is off these medications now. No other obvious exposures. She is unsure whether she has been evaluated for VTE, but thinks this may have been done in [place].  - will obtain all records - will start the auto-immune w/u - hold off on V/q scan for now but consider - she could have some degree of primary PAH but I doubt given the clinical hx. Will consider R heart cath and meds in the future depending on our other eval .

## 2014-07-04 ENCOUNTER — Emergency Department (HOSPITAL_COMMUNITY)
Admission: EM | Admit: 2014-07-04 | Discharge: 2014-07-04 | Disposition: A | Discharge status: Home / Self Care | Payer: Medicare Other | Attending: Emergency Medicine | Admitting: Emergency Medicine

## 2014-07-04 ENCOUNTER — Encounter (HOSPITAL_COMMUNITY): Payer: Self-pay | Admitting: Emergency Medicine

## 2014-07-04 DIAGNOSIS — Z992 Dependence on renal dialysis: Secondary | ICD-10-CM | POA: Insufficient documentation

## 2014-07-04 DIAGNOSIS — Z940 Kidney transplant status: Secondary | ICD-10-CM | POA: Diagnosis not present

## 2014-07-04 DIAGNOSIS — R1900 Intra-abdominal and pelvic swelling, mass and lump, unspecified site: Secondary | ICD-10-CM | POA: Insufficient documentation

## 2014-07-04 DIAGNOSIS — R011 Cardiac murmur, unspecified: Secondary | ICD-10-CM | POA: Insufficient documentation

## 2014-07-04 DIAGNOSIS — I120 Hypertensive chronic kidney disease with stage 5 chronic kidney disease or end stage renal disease: Secondary | ICD-10-CM | POA: Diagnosis not present

## 2014-07-04 DIAGNOSIS — Z79899 Other long term (current) drug therapy: Secondary | ICD-10-CM | POA: Diagnosis not present

## 2014-07-04 DIAGNOSIS — R188 Other ascites: Secondary | ICD-10-CM | POA: Insufficient documentation

## 2014-07-04 DIAGNOSIS — Z862 Personal history of diseases of the blood and blood-forming organs and certain disorders involving the immune mechanism: Secondary | ICD-10-CM | POA: Insufficient documentation

## 2014-07-04 DIAGNOSIS — N186 End stage renal disease: Secondary | ICD-10-CM | POA: Diagnosis not present

## 2014-07-04 NOTE — Discharge Instructions (Signed)
Please read and follow all provided instructions.  Your diagnoses today include:  1. Ascites    Tests performed today include:  Vital signs. See below for your results today.   Medications prescribed:   None  Home care instructions:  Follow any educational materials contained in this packet.   Follow-up instructions: Please follow-up with your primary care provider as needed for further evaluation of your symptoms.  You will receive a phone call tomorrow to schedule an appointment for a paracentesis.  As we discussed, you should call your Dialysis Center for instructions regarding a catch-up dialysis session.   Return instructions:   Please return to the Emergency Department if you experience worsening symptoms.   Please return if you have any other emergent concerns.  Additional Information:  Your vital signs today were: BP 170/82   Pulse 77   Temp(Src) 98.4 F (36.9 C) (Oral)   Resp 23   SpO2 100% If your blood pressure (BP) was elevated above 135/85 this visit, please have this repeated by your doctor within one month. ---------------

## 2014-07-04 NOTE — ED Provider Notes (Signed)
Medical screening examination/treatment/procedure(s) were performed by non-physician practitioner and as supervising physician I was immediately available for consultation/collaboration.   EKG Interpretation None       Arby Barrette, MD 07/04/14 [phone]

## 2014-07-04 NOTE — ED Notes (Signed)
Pt in stating she needs a paracentesis, states she is a dialysis patient but has trouble with fluid build up on her abdomen, before moving here she was getting paracentesis every week but has not been able to get in with a doctor in this area, last had this done in July, pt has been completing dialysis like normal, pt alert and oriented, no distress noted

## 2014-07-04 NOTE — ED Provider Notes (Signed)
CSN: [id number]     Arrival date & time 07/04/14  1403 History   First MD Initiated Contact with Patient 07/04/14 1654     Chief Complaint  Patient presents with  . Abdominal swelling      (Consider location/radiation/quality/duration/timing/severity/associated sxs/prior Treatment) HPI Comments: Patient with history of end-stage renal disease, on Monday/Wednesday/Friday dialysis, ascites -- presents with complaint of dull, aching abdominal pain which she attributes to her abdominal distention from her ascites. Patient states that she moved to [place] from [place] in 04/2014. At that time, patient had been receiving weekly paracenteses for one year. She has not had a paracentesis since July. She has had progressively worsening reaccumulation of fluid. Patient has also had nausea, worse last night, which she attributes to her abdominal swelling. Patient denies severe abdominal pain. She denies fever, vomiting, diarrhea, urinary symptoms. Patient is currently undergoing a workup by pulmonology and cardiology. Her nephrologist is Dr. Abel Presto. She is here today requesting a therapeutic paracentesis for symptomatic control.  The history is provided by the patient and medical records.    Past Medical History  Diagnosis Date  . 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 Hypertension   . ESRD on hemodialysis 04/20/2014    Patient started HD in 1998.  She has been dialyzed in [place] at "[facility]" until moving to [place] in July 2015 to live with family.  She has had failed accesses in the L arm.  No attempt to place access was made in the R arm, patient is not sure why.  She has a L thigh AVG which she says has been functional for 7-8 years.  They stopped giving her heparin several years ago due to prolonged access bleeding.    Marland Kitchen Heart murmur   . Anemia of chronic disease   . History of blood transfusion     "several; w/transplant, infections, etc"   . Headache(784.0)     "often; sometimes daily" (04/26/2014)  . Migraines     "qod" (04/26/2014)  . ESRD on hemodialysis 04/20/2014    Patient started HD in 1998.  She has been dialyzed in [place] at "[facility]" until moving to [place] in July 2015 to live with family.  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  She has had failed accesses in the L arm.  No attempt to place access was made in the R arm, patient is not sure why.  She has a L thigh AVG which she says has been functional for   Past Surgical History  Procedure Laterality Date  . Arteriovenous graft placement Left 2013    thigh  . 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 Nephrectomy transplanted organ  2013   Family History  Problem Relation Age of Onset  . Allergies Maternal Grandmother    History  Substance Use Topics  . Smoking status: Never Smoker   . Smokeless tobacco: Never Used  . Alcohol Use: No   OB History   Grav Para Term Preterm Abortions TAB SAB Ect Mult Living                 Review of Systems  Constitutional: Negative for fever.  HENT: Negative for rhinorrhea and sore throat.   Eyes: Negative for redness.  Respiratory: Negative for cough, chest tightness, shortness of breath and wheezing.   Cardiovascular:  Negative for chest pain.  Gastrointestinal: Positive for nausea, abdominal pain (mild, L>R) and abdominal distention. Negative for vomiting and diarrhea.  Genitourinary: Negative for dysuria.  Musculoskeletal: Negative for myalgias.  Skin: Negative for rash.  Neurological: Negative for headaches.   Allergies  Contrast media; Procardia; and Vancomycin  Home Medications   Prior to Admission medications   Medication Sig Start Date End Date Taking? Authorizing Provider  calcium acetate (PHOSLO) 667 MG capsule Take 1,334 mg by mouth 3 (three) times daily with meals.   Yes Historical Provider, MD  cloNIDine  (CATAPRES) 0.3 MG tablet Take 0.3 mg by mouth 2 (two) times daily.   Yes Historical Provider, MD  Skin Protectants, Misc. (EUCERIN) cream Apply 1 application topically daily as needed for dry skin.    Yes Historical Provider, MD   BP 182/97  Pulse 82  Temp(Src) 98.4 F (36.9 C) (Oral)  Resp 18  SpO2 100%  Physical Exam  Nursing note and vitals reviewed. Constitutional: She appears well-developed and well-nourished.  HENT:  Head: Normocephalic and atraumatic.  Eyes: Conjunctivae are normal. Right eye exhibits no discharge. Left eye exhibits no discharge.  Neck: Normal range of motion. Neck supple.  Cardiovascular: Normal rate, regular rhythm and normal heart sounds.   Pulmonary/Chest: Effort normal and breath sounds normal. No respiratory distress. She has no wheezes. She has no rales.  Abdominal: Soft. Bowel sounds are normal. She exhibits distension (moderate ascites, + fluid wave). There is no tenderness (no significant tenderness on exam). There is no rebound and no guarding.  Musculoskeletal: She exhibits no edema (no lower extremity edema) and no tenderness.  Neurological: She is alert.  Skin: Skin is warm and dry.  Psychiatric: She has a normal mood and affect.    ED Course  Procedures (including critical care time) Labs Review Labs Reviewed - No data to display  Imaging Review No results found.   EKG Interpretation None      4:55PM PM Patient seen and examined.   Vital signs reviewed and are as follows: BP 170/82  Pulse 77  Temp(Src) 98.4 F (36.9 C) (Oral)  Resp 23  SpO2 100%  Pt discussed with Dr. Donnald Garre.   I spoke with interventional radiology who states it will not be possible to get this done after hours. They put me in touch with central scheduling who instructed me to please order for paracentesis. They confirmed for me that the order was input properly. Patient will be called in the morning to schedule an appointment for a therapeutic  paracentesis.  Plan discussed with patient and she is in agreement. Patient is much relieved that this problem is being addressed. She agrees to call her dialysis Center regarding her missed dialysis session today.  We discussed no indications for emergent procedure or hospitalization today. Patient is in agreement. Will discharged home with this plan in place.  MDM   Final diagnoses:  Ascites   Ascites: Patient with gradual reaccumulation of ascites over the past 2 months. She is starting to become symptomatic from this. She is not short of breath and she has a normal lung exam. She does not have a fever or peritoneal signs on exam. I do not suspect spontaneous bacterial peritonitis. Arrangements made for patient to receive therapeutic paracentesis the near future.  End-stage renal disease: This is stable. Patient missed her dialysis session today and will speak with her dialysis center regarding this. No signs of fluid overload, pulmonary edema.    Renne Crigler, PA-C 07/04/14  1811 

## 2014-07-05 ENCOUNTER — Ambulatory Visit (HOSPITAL_COMMUNITY)
Admission: RE | Admit: 2014-07-05 | Discharge: 2014-07-05 | Disposition: A | Discharge status: Home / Self Care | Payer: Medicare Other | Source: Ambulatory Visit | Attending: Internal Medicine | Admitting: Internal Medicine

## 2014-07-05 ENCOUNTER — Encounter (HOSPITAL_COMMUNITY): Payer: Self-pay

## 2014-07-05 VITALS — BP 166/88 | HR 81 | Ht 69.5 in | Wt 178.8 lb

## 2014-07-05 DIAGNOSIS — I120 Hypertensive chronic kidney disease with stage 5 chronic kidney disease or end stage renal disease: Secondary | ICD-10-CM | POA: Diagnosis not present

## 2014-07-05 DIAGNOSIS — I2721 Secondary pulmonary arterial hypertension: Secondary | ICD-10-CM

## 2014-07-05 DIAGNOSIS — R188 Other ascites: Secondary | ICD-10-CM

## 2014-07-05 DIAGNOSIS — I10 Essential (primary) hypertension: Secondary | ICD-10-CM

## 2014-07-05 DIAGNOSIS — I2789 Other specified pulmonary heart diseases: Secondary | ICD-10-CM

## 2014-07-05 DIAGNOSIS — K766 Portal hypertension: Secondary | ICD-10-CM

## 2014-07-05 DIAGNOSIS — N186 End stage renal disease: Secondary | ICD-10-CM | POA: Insufficient documentation

## 2014-07-05 DIAGNOSIS — J811 Chronic pulmonary edema: Secondary | ICD-10-CM

## 2014-07-05 HISTORY — DX: Other ascites: R18.8

## 2014-07-05 NOTE — Procedures (Signed)
US guided therapeutic paracentesis performed yielding 7  liters yellow fluid. No immediate complications.  

## 2014-07-05 NOTE — Patient Instructions (Signed)
Please get records from last hospital especially your right heart catheterization faxed to [place] at [phone]  Will set you up for a paracentesis.  Will schedule a VQ scan and abdominal ultrasound.  Follow up in 1 month  Do the following things EVERYDAY: 1) Weigh yourself in the morning before breakfast. Write it down and keep it in a log. 2) Take your medicines as prescribed 3) Eat low salt foods-Limit salt (sodium) to 2000 mg per day.  4) Stay as active as you can everyday 5) Limit all fluids for the day to less than 2 liters 6)

## 2014-07-05 NOTE — Progress Notes (Signed)
Patient ID: Holly Mendoza, female   DOB: 03-22-1971, 43 y.o.   MRN: [id number] Referring Physician: Dr. Arrie Aran Primary Care: N/A Primary Cardiologist: N/A Pulmonologist: Dr. Delton Coombes  HPI: Holly Mendoza is a 43 yo female with a history ESRD on HD s/p failed kidney tx 2012, HTN, ascites (was going for weekly paracentesis in [place]). She was referred for pulmonary HTN.  TTE 05/01/14: w moderate LVH, MV thickening and prolapse, estimated peak PASP  ECHO (04/2014): EF 65-70%, grade I DD, RV nl, mod TR  Moved to [place] in July of this year and previously was getting paracentesis once weekly. HD on M/W/F. Dry weight 80 kg and BP never really comes down with HD but when gets paracentesis BP comes down. SOB with minimal activity. +orthopnea and CP when she is on HD. Denies LE edema. + ascites. Follows a renal/low salt diet. Drinks less than 2L a day. Reports she had liver US in [place] and negative for cirrhosis. Denies snoring.    Review of Systems: [y] = yes,  = no   General: Weight gain ; Weight loss ; Anorexia ; Fatigue [Y ]; Fever ; Chills ; Weakness   Cardiac: Chest pain/pressure [Y ]; Resting SOB ; Exertional SOB [Y ]; Orthopnea [Y ]; Pedal Edema ; Palpitations ; Syncope ; Presyncope ; Paroxysmal nocturnal dyspnea[ ]  dizziness [Y] Pulmonary: Cough ; Wheezing[ ] ; Hemoptysis[ ] ; Sputum ; Snoring   GI: Vomiting[ ] ; Dysphagia[ ] ; Melena[ ] ; Hematochezia ; Heartburn[ ] ; Abdominal pain ; Constipation ; Diarrhea ; BRBPR   GU: Hematuria[ ] ; Dysuria ; Nocturia[ ]   Vascular: Pain in legs with walking ; Pain in feet with lying flat ; Non-healing sores ; Stroke ; TIA ; Slurred speech ;  Neuro: Headaches[ ] ; Vertigo[ ] ; Seizures[ ] ; Paresthesias[ ] ;Blurred vision ; Diplopia ; Vision changes   Ortho/Skin: Arthritis ; Joint pain [ Y]; Muscle pain ; Joint swelling ; Back Pain ; Rash   Psych: Depression[ ] ;  Anxiety[ ]   Heme: Bleeding problems ; Clotting disorders ; Anemia   Endocrine: Diabetes ; Thyroid dysfunction[ ]    Past Medical History  Diagnosis Date  . 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 Hypertension   . ESRD on hemodialysis 04/20/2014    Patient started HD in 1998.  She has been dialyzed in [place] at "[facility]" until moving to [place] in July 2015 to live with family.  She has had failed accesses in the L arm.  No attempt to place access was made in the R arm, patient is not sure why.  She has a L thigh AVG which she says has been functional for 7-8 years.  They stopped giving her heparin several years ago due to prolonged access bleeding.    Marland Kitchen Heart murmur   . Anemia of chronic disease   . History of blood transfusion     "several; w/transplant, infections, etc"  . Headache(784.0)     "often; sometimes daily" (04/26/2014)  . Migraines     "qod" (04/26/2014)  . ESRD on hemodialysis 04/20/2014    Patient started HD in 1998.  She has been dialyzed in [place] at "[facility]" until moving to [place] in July 2015 to live with family.  She had  a transplant in 2012 which didn't take and it was removed in 2013.  She has had failed accesses in the L arm.  No attempt to place access was made in the R arm, patient is not sure why.  She has a L thigh AVG which she says has been functional for  . Ascites     Current Outpatient Prescriptions  Medication Sig Dispense Refill  . calcium acetate (PHOSLO) 667 MG capsule Take 1,334 mg by mouth 3 (three) times daily with meals.      . cloNIDine (CATAPRES) 0.3 MG tablet Take 0.3 mg by mouth 2 (two) times daily.      . Skin Protectants, Misc. (EUCERIN) cream Apply 1 application topically daily as needed for dry skin.        No current facility-administered medications for this encounter.    Allergies  Allergen Reactions  . Contrast Media [Iodinated Diagnostic Agents] Anaphylaxis  .  Procardia [Nifedipine] Other (See Comments)    Sweating, BP drops dangerously low,  . Vancomycin Anaphylaxis and Hives      History   Social History  . Marital Status: Single    Spouse Name: N/A    Number of Children: N/A  . Years of Education: N/A   Occupational History  . Umemployed    Social History Main Topics  . Smoking status: Never Smoker   . Smokeless tobacco: Never Used  . Alcohol Use: No  . Drug Use: No  . Sexual Activity: No   Other Topics Concern  . Not on file   Social History Narrative   Lives in [place] with Elaine. Moved from [place] in July 2015.       Family History  Problem Relation Age of Onset  . Healthy Mother     Ceasar Mons Vitals:   07/05/14 0942  BP: 166/88  Pulse: 81  Height: 5' 9.5" (1.765 m)  Weight: 178 lb 12.8 oz (81.103 kg)  SpO2: 97%    PHYSICAL EXAM: General:  Well appearing. No respiratory difficulty HEENT: normal Neck: supple. JVD 9-10. Carotids 2+ bilat; no bruits. No lymphadenopathy or thryomegaly appreciated. Cor: PMI nondisplaced. Regular rate & rhythm. No rubs or gallops; 3/6 SEM TR Lungs: clear Abdomen: soft, nontender, +++distended. No hepatosplenomegaly. No bruits or masses. Good bowel sounds. Extremities: no cyanosis, clubbing, rash, trace bilateral edema, L thigh horseshoe graft Neuro: alert & oriented x 3, cranial nerves grossly intact. moves all 4 extremities w/o difficulty. Affect pleasant.   ASSESSMENT & PLAN:  1) ESRD: s/p failed kidnedy tx 2012; She goes to HD on M/W/F and dry weight is 80 kg. She remains volume overloaded and would like to get dry weight less. 2) HTN - Remains elevated. Patient reports even during HD it does not decrease much. Will continue clonidine. May need to add on second agent if BP does not improve with paracentesis.  3) Ascites - She has marked volume overload and abdominal distention. Reports before moving to [place] in July that she was getting weekly paracentesis. Will schedule  for paracentesis today and likely will need to get back on regular schedule here to help manage fluid. Will get RUQ [place] to assess liver for cirrhosis 4) PHTN - Likely multifactorial.  - Will obtain records of RHC from previous hospital and then determine whether we will proceed with another RHC.  - Will get VQ scan.  F/U 1 month Ulla Potash B NP-C 10:48 AM  Patient seen and examined with Ulla Potash, NP. We discussed all aspects  of the encounter. I agree with the assessment and plan as stated above. She has marked ascites. Agree with paracentesis.  Will need further w/u of PAH. May have porto-pulmonary HTN or pHTN related to ESRD. Check VQ and RUQ u/s.  Low threshold to proceed with RHC. Will leave arterial HTN for nephrology to address.   Total time spent 45 minutes. Over half that time spent discussing above.   Daniel Bensimhon,MD 4:03 PM

## 2014-07-11 DIAGNOSIS — R188 Other ascites: Secondary | ICD-10-CM | POA: Insufficient documentation

## 2014-07-12 ENCOUNTER — Ambulatory Visit (HOSPITAL_COMMUNITY): Admission: RE | Admit: 2014-07-12 | Payer: Medicare Other | Source: Ambulatory Visit

## 2014-07-17 ENCOUNTER — Ambulatory Visit (HOSPITAL_COMMUNITY): Payer: Medicare Other

## 2014-07-18 ENCOUNTER — Telehealth (HOSPITAL_COMMUNITY): Payer: Self-pay | Admitting: Vascular Surgery

## 2014-07-18 DIAGNOSIS — R188 Other ascites: Secondary | ICD-10-CM

## 2014-07-18 NOTE — Telephone Encounter (Signed)
Ok per Ulla Potash, NP for pt to have weekly paracentesis, pt is aware and agreeable, order placed for standing order will call [facility] and sch for tomorrow and call her back

## 2014-07-18 NOTE — Telephone Encounter (Signed)
Pt sch for 10/1 at 10am, pt aware

## 2014-07-18 NOTE — Telephone Encounter (Signed)
Pt wants to know if she can have a standing order for a paracentesis .Marland Kitchen Please advise

## 2014-07-19 ENCOUNTER — Ambulatory Visit (HOSPITAL_COMMUNITY)
Admission: RE | Admit: 2014-07-19 | Discharge: 2014-07-19 | Disposition: A | Discharge status: Home / Self Care | Payer: Medicare Other | Source: Ambulatory Visit | Attending: Internal Medicine | Admitting: Internal Medicine

## 2014-07-19 DIAGNOSIS — I272 Other secondary pulmonary hypertension: Secondary | ICD-10-CM | POA: Insufficient documentation

## 2014-07-19 DIAGNOSIS — N186 End stage renal disease: Secondary | ICD-10-CM | POA: Insufficient documentation

## 2014-07-19 DIAGNOSIS — R188 Other ascites: Secondary | ICD-10-CM

## 2014-07-19 DIAGNOSIS — K766 Portal hypertension: Secondary | ICD-10-CM | POA: Insufficient documentation

## 2014-07-19 NOTE — Procedures (Signed)
Successful US guided paracentesis from RLQ.  Yielded 5.8 liters of clear yellow fluid.  No immediate complications.  Pt tolerated well.   Specimen was not sent for labs.  Pattricia Boss D PA-C 07/19/2014 1:31 PM

## 2014-07-20 NOTE — Telephone Encounter (Signed)
Open in error

## 2014-07-24 ENCOUNTER — Ambulatory Visit (HOSPITAL_COMMUNITY): Admission: RE | Admit: 2014-07-24 | Payer: Medicare Other | Source: Ambulatory Visit

## 2014-07-31 ENCOUNTER — Ambulatory Visit (HOSPITAL_COMMUNITY)
Admission: RE | Admit: 2014-07-31 | Discharge: 2014-07-31 | Disposition: A | Discharge status: Home / Self Care | Payer: Medicare Other | Source: Ambulatory Visit | Attending: Diagnostic Radiology | Admitting: Diagnostic Radiology

## 2014-07-31 ENCOUNTER — Telehealth (HOSPITAL_COMMUNITY): Payer: Self-pay | Admitting: Vascular Surgery

## 2014-07-31 ENCOUNTER — Ambulatory Visit (HOSPITAL_COMMUNITY)
Admission: RE | Admit: 2014-07-31 | Discharge: 2014-07-31 | Disposition: A | Discharge status: Home / Self Care | Payer: Medicare Other | Source: Ambulatory Visit | Attending: Internal Medicine | Admitting: Internal Medicine

## 2014-07-31 DIAGNOSIS — J918 Pleural effusion in other conditions classified elsewhere: Secondary | ICD-10-CM | POA: Insufficient documentation

## 2014-07-31 DIAGNOSIS — J189 Pneumonia, unspecified organism: Secondary | ICD-10-CM | POA: Diagnosis not present

## 2014-07-31 DIAGNOSIS — K766 Portal hypertension: Secondary | ICD-10-CM

## 2014-07-31 DIAGNOSIS — I2721 Secondary pulmonary arterial hypertension: Secondary | ICD-10-CM

## 2014-07-31 DIAGNOSIS — R05 Cough: Secondary | ICD-10-CM | POA: Diagnosis present

## 2014-07-31 DIAGNOSIS — J811 Chronic pulmonary edema: Secondary | ICD-10-CM

## 2014-07-31 NOTE — Telephone Encounter (Signed)
[place] form [facility] called pt refused VQ scan today... fyi

## 2014-08-02 NOTE — Telephone Encounter (Signed)
Pt has f/u appt 10/22 will address at that time

## 2014-08-09 ENCOUNTER — Other Ambulatory Visit (HOSPITAL_COMMUNITY): Payer: Self-pay | Admitting: Internal Medicine

## 2014-08-09 ENCOUNTER — Encounter (HOSPITAL_COMMUNITY): Payer: Medicare Other

## 2014-08-09 DIAGNOSIS — R188 Other ascites: Secondary | ICD-10-CM

## 2014-08-14 ENCOUNTER — Ambulatory Visit: Payer: Medicare Other | Admitting: Emergency Medicine

## 2014-08-14 ENCOUNTER — Ambulatory Visit (HOSPITAL_COMMUNITY): Admission: RE | Admit: 2014-08-14 | Payer: Medicare Other | Source: Ambulatory Visit

## 2014-08-14 ENCOUNTER — Other Ambulatory Visit (HOSPITAL_COMMUNITY): Payer: Medicare Other

## 2014-08-15 ENCOUNTER — Ambulatory Visit (HOSPITAL_COMMUNITY)
Admission: RE | Admit: 2014-08-15 | Discharge: 2014-08-15 | Disposition: A | Discharge status: Home / Self Care | Payer: Medicare Other | Source: Ambulatory Visit | Attending: Internal Medicine | Admitting: Internal Medicine

## 2014-08-15 DIAGNOSIS — R188 Other ascites: Secondary | ICD-10-CM | POA: Diagnosis present

## 2014-08-15 NOTE — Procedures (Signed)
US guided therapeutic paracentesis performed yielding 7.2 liters yellow fluid. No immediate complications.

## 2014-08-21 ENCOUNTER — Telehealth (HOSPITAL_COMMUNITY): Payer: Self-pay | Admitting: Vascular Surgery

## 2014-08-27 NOTE — Telephone Encounter (Signed)
OPEN IN ERROR 

## 2014-08-31 ENCOUNTER — Emergency Department (HOSPITAL_COMMUNITY): Payer: Medicare Other

## 2014-08-31 ENCOUNTER — Observation Stay (HOSPITAL_COMMUNITY)
Admission: EM | Admit: 2014-08-31 | Discharge: 2014-09-01 | Disposition: A | Discharge status: Home / Self Care | Payer: Medicare Other | Attending: Internal Medicine | Admitting: Internal Medicine

## 2014-08-31 ENCOUNTER — Encounter (HOSPITAL_COMMUNITY): Payer: Self-pay | Admitting: Emergency Medicine

## 2014-08-31 DIAGNOSIS — Z79899 Other long term (current) drug therapy: Secondary | ICD-10-CM | POA: Insufficient documentation

## 2014-08-31 DIAGNOSIS — J90 Pleural effusion, not elsewhere classified: Secondary | ICD-10-CM | POA: Diagnosis not present

## 2014-08-31 DIAGNOSIS — N2581 Secondary hyperparathyroidism of renal origin: Secondary | ICD-10-CM | POA: Diagnosis not present

## 2014-08-31 DIAGNOSIS — R079 Chest pain, unspecified: Principal | ICD-10-CM | POA: Insufficient documentation

## 2014-08-31 DIAGNOSIS — N186 End stage renal disease: Secondary | ICD-10-CM | POA: Diagnosis not present

## 2014-08-31 DIAGNOSIS — I4510 Unspecified right bundle-branch block: Secondary | ICD-10-CM | POA: Diagnosis not present

## 2014-08-31 DIAGNOSIS — I10 Essential (primary) hypertension: Secondary | ICD-10-CM | POA: Diagnosis present

## 2014-08-31 DIAGNOSIS — Z91041 Radiographic dye allergy status: Secondary | ICD-10-CM | POA: Insufficient documentation

## 2014-08-31 DIAGNOSIS — R188 Other ascites: Secondary | ICD-10-CM | POA: Insufficient documentation

## 2014-08-31 DIAGNOSIS — I150 Renovascular hypertension: Secondary | ICD-10-CM

## 2014-08-31 DIAGNOSIS — D631 Anemia in chronic kidney disease: Secondary | ICD-10-CM | POA: Diagnosis not present

## 2014-08-31 DIAGNOSIS — I120 Hypertensive chronic kidney disease with stage 5 chronic kidney disease or end stage renal disease: Secondary | ICD-10-CM | POA: Diagnosis not present

## 2014-08-31 DIAGNOSIS — N189 Chronic kidney disease, unspecified: Secondary | ICD-10-CM

## 2014-08-31 DIAGNOSIS — Z881 Allergy status to other antibiotic agents status: Secondary | ICD-10-CM | POA: Insufficient documentation

## 2014-08-31 DIAGNOSIS — R0602 Shortness of breath: Secondary | ICD-10-CM

## 2014-08-31 DIAGNOSIS — J810 Acute pulmonary edema: Secondary | ICD-10-CM | POA: Diagnosis not present

## 2014-08-31 DIAGNOSIS — Z992 Dependence on renal dialysis: Secondary | ICD-10-CM | POA: Insufficient documentation

## 2014-08-31 DIAGNOSIS — J811 Chronic pulmonary edema: Secondary | ICD-10-CM | POA: Diagnosis present

## 2014-08-31 DIAGNOSIS — Z888 Allergy status to other drugs, medicaments and biological substances status: Secondary | ICD-10-CM | POA: Diagnosis not present

## 2014-08-31 LAB — BASIC METABOLIC PANEL
Anion gap: 18 — ABNORMAL HIGH (ref 5–15)
BUN: 56 mg/dL — ABNORMAL HIGH (ref 6–23)
CO2: 24 meq/L (ref 19–32)
CREATININE: 9 mg/dL — AB (ref 0.50–1.10)
Calcium: 8.2 mg/dL — ABNORMAL LOW (ref 8.4–10.5)
Chloride: 97 mEq/L (ref 96–112)
GFR calc Af Amer: 6 mL/min — ABNORMAL LOW (ref >90)
GFR calc non Af Amer: 5 mL/min — ABNORMAL LOW (ref >90)
Glucose, Bld: 98 mg/dL (ref 70–99)
Potassium: 5 mEq/L (ref 3.7–5.3)
Sodium: 139 mEq/L (ref 137–147)

## 2014-08-31 LAB — CBC
HCT: 28.2 % — ABNORMAL LOW (ref 36.0–46.0)
Hemoglobin: 9.3 g/dL — ABNORMAL LOW (ref 12.0–15.0)
MCH: 28.4 pg (ref 26.0–34.0)
MCHC: 33 g/dL (ref 30.0–36.0)
MCV: 86.2 fL (ref 78.0–100.0)
Platelets: 157 10*3/uL (ref 150–400)
RBC: 3.27 MIL/uL — ABNORMAL LOW (ref 3.87–5.11)
RDW: 14.7 % (ref 11.5–15.5)
WBC: 4.5 10*3/uL (ref 4.0–10.5)

## 2014-08-31 LAB — TROPONIN I: Troponin I: <0.3 ng/mL (ref <0.30)

## 2014-08-31 LAB — I-STAT CG4 LACTIC ACID, ED: Lactic Acid, Venous: 0.74 mmol/L (ref 0.5–2.2)

## 2014-08-31 LAB — PRO B NATRIURETIC PEPTIDE: Pro B Natriuretic peptide (BNP): 46663 pg/mL — ABNORMAL HIGH (ref 0–125)

## 2014-08-31 MED ORDER — HYDROCERIN EX CREA
TOPICAL_CREAM | Freq: Every day | CUTANEOUS | Status: DC | PRN
  Filled 2014-08-31: qty 113

## 2014-08-31 MED ORDER — ASPIRIN 325 MG PO TABS
325.0000 mg | ORAL_TABLET | Freq: Every day | ORAL | Status: DC
  Administered 2014-08-31 – 2014-09-01 (×2): 325 mg via ORAL
  Filled 2014-08-31 (×2): qty 1

## 2014-08-31 MED ORDER — CALCIUM ACETATE 667 MG PO CAPS
1334.0000 mg | ORAL_CAPSULE | Freq: Three times a day (TID) | ORAL | Status: DC
  Administered 2014-09-01: 1334 mg via ORAL
  Filled 2014-08-31 (×4): qty 2

## 2014-08-31 MED ORDER — SODIUM CHLORIDE 0.9 % IJ SOLN: 3.0000 mL | INTRAMUSCULAR | Status: DC | PRN

## 2014-08-31 MED ORDER — CLONIDINE HCL 0.3 MG PO TABS
0.3000 mg | ORAL_TABLET | Freq: Two times a day (BID) | ORAL | Status: DC
  Administered 2014-08-31 – 2014-09-01 (×2): 0.3 mg via ORAL
  Filled 2014-08-31 (×4): qty 1

## 2014-08-31 MED ORDER — HYDROMORPHONE HCL 1 MG/ML IJ SOLN: 0.5000 mg | INTRAMUSCULAR | Status: DC | PRN

## 2014-08-31 MED ORDER — HEPARIN SODIUM (PORCINE) 5000 UNIT/ML IJ SOLN
5000.0000 [IU] | Freq: Three times a day (TID) | INTRAMUSCULAR | Status: DC
  Filled 2014-08-31 (×4): qty 1

## 2014-08-31 MED ORDER — SODIUM CHLORIDE 0.9 % IJ SOLN
3.0000 mL | Freq: Two times a day (BID) | INTRAMUSCULAR | Status: DC
  Administered 2014-09-01 (×2): 3 mL via INTRAVENOUS

## 2014-08-31 MED ORDER — ACETAMINOPHEN 325 MG PO TABS
650.0000 mg | ORAL_TABLET | Freq: Once | ORAL | Status: AC
  Administered 2014-08-31: 650 mg via ORAL
  Filled 2014-08-31: qty 2

## 2014-08-31 MED ORDER — ONDANSETRON HCL 4 MG PO TABS: 4.0000 mg | ORAL_TABLET | Freq: Four times a day (QID) | ORAL | Status: DC | PRN

## 2014-08-31 MED ORDER — SODIUM CHLORIDE 0.9 % IJ SOLN: 3.0000 mL | Freq: Two times a day (BID) | INTRAMUSCULAR | Status: DC

## 2014-08-31 MED ORDER — ACETAMINOPHEN 325 MG PO TABS: 650.0000 mg | ORAL_TABLET | Freq: Four times a day (QID) | ORAL | Status: DC | PRN

## 2014-08-31 MED ORDER — SODIUM CHLORIDE 0.9 % IV SOLN: 250.0000 mL | INTRAVENOUS | Status: DC | PRN

## 2014-08-31 MED ORDER — NITROGLYCERIN 2 % TD OINT
1.0000 [in_us] | TOPICAL_OINTMENT | Freq: Four times a day (QID) | TRANSDERMAL | Status: DC
  Filled 2014-08-31: qty 1

## 2014-08-31 MED ORDER — ONDANSETRON HCL 4 MG/2ML IJ SOLN: 4.0000 mg | Freq: Four times a day (QID) | INTRAMUSCULAR | Status: DC | PRN

## 2014-08-31 MED ORDER — ACETAMINOPHEN 650 MG RE SUPP: 650.0000 mg | Freq: Four times a day (QID) | RECTAL | Status: DC | PRN

## 2014-08-31 MED ORDER — EUCERIN EX CREA: 1.0000 "application " | TOPICAL_CREAM | Freq: Every day | CUTANEOUS | Status: DC | PRN

## 2014-08-31 MED ORDER — OXYCODONE HCL 5 MG PO TABS: 5.0000 mg | ORAL_TABLET | ORAL | Status: DC | PRN

## 2014-08-31 MED ORDER — LORAZEPAM 1 MG PO TABS
1.0000 mg | ORAL_TABLET | Freq: Once | ORAL | Status: AC
  Administered 2014-08-31: 1 mg via ORAL
  Filled 2014-08-31: qty 1

## 2014-08-31 NOTE — ED Notes (Signed)
Attempted report 

## 2014-08-31 NOTE — ED Notes (Addendum)
Called report.  Floor requested PRN blood pressure medication. Released clonidine.  Pt took one dose this morning and has not had nightly dose.  Floor notified.

## 2014-08-31 NOTE — ED Notes (Signed)
Spoke with Dr. Criss Alvine after pt refused nitropaste because side effect of h/a.  Pt told this was a lower dose than a nitro tablet.  Pt still refused.  Dr. Truitt Merle.

## 2014-08-31 NOTE — H&P (Addendum)
[facility] Admission History and Physical       Holly Mendoza ZOX:[id number] DOB: 10-01-1971 DOA: 08/31/2014  Referring physician: EDP PCP: PROVIDER NOT IN SYSTEM  Specialists:   Chief Complaint: Chest Pain  HPI: Holly Mendoza is a 43 y.o. female with history of ESRD on HD MWF, Failed Renal Transplant and HTN who was receiving her dialysis treatement today and began to have sharp 10/10 left sided chest pain.   She was unable to complete her full dialysis treatment and was sent to the ED for further evaluation.   Her Chest pain was associated with nausea and SOB .   The chest pain had resolved by the time she arrived to the ED.     Review of Systems:  Constitutional: No Weight Loss, No Weight Gain, Night Sweats, Fevers, Chills, Dizziness, Fatigue, or Generalized Weakness HEENT: No Headaches, Difficulty Swallowing,Tooth/Dental Problems,Sore Throat,  No Sneezing, Rhinitis, Ear Ache, Nasal Congestion, or Post Nasal Drip,  Cardio-vascular:  +Chest pain, Orthopnea, PND, Edema in Lower Extremities, Anasarca, Dizziness, Palpitations  Resp: +Dyspnea, No DOE, No Productive Cough, No Non-Productive Cough, No Hemoptysis, No Wheezing.    GI: No Heartburn, Indigestion, Abdominal Pain, +Nausea, Vomiting, Diarrhea, Hematemesis, Hematochezia, Melena, Change in Bowel Habits,  Loss of Appetite  GU: No Dysuria, Change in Color of Urine, No Urgency or Frequency, No Flank pain.  Musculoskeletal: No Joint Pain or Swelling, No Decreased Range of Motion, No Back Pain.  Neurologic: No Syncope, No Seizures, Muscle Weakness, Paresthesia, Vision Disturbance or Loss, No Diplopia, No Vertigo, No Difficulty Walking,  Skin: No Rash or Lesions. Psych: No Change in Mood or Affect, No Depression or Anxiety, No Memory loss, No Confusion, or Hallucinations   Past Medical History  Diagnosis Date  . 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 Hypertension   . ESRD on hemodialysis  04/20/2014    Patient started HD in 1998.  She has been dialyzed in [place] at "[facility]" until moving to [place] in July 2015 to live with family.  She has had failed accesses in the L arm.  No attempt to place access was made in the R arm, patient is not sure why.  She has a L thigh AVG which she says has been functional for 7-8 years.  They stopped giving her heparin several years ago due to prolonged access bleeding.    Marland Kitchen Heart murmur   . Anemia of chronic disease   . History of blood transfusion     "several; w/transplant, infections, etc"  . Headache(784.0)     "often; sometimes daily" (04/26/2014)  . Migraines     "qod" (04/26/2014)  . ESRD on hemodialysis 04/20/2014    Patient started HD in 1998.  She has been dialyzed in [place] at "[facility]" until moving to [place] in July 2015 to live with family.  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  She has had failed accesses in the L arm.  No attempt to place access was made in the R arm, patient is not sure why.  She has a L thigh AVG which she says has been functional for  . Ascites     Past Surgical History  Procedure Laterality Date  . Arteriovenous graft placement Left 2013    thigh  . 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 Nephrectomy transplanted organ  2013     Prior to Admission medications   Medication Sig Start Date End Date Taking? Authorizing Provider  calcium acetate (PHOSLO) 667 MG capsule Take 1,334 mg by mouth 3 (three) times daily with meals.   Yes Historical Provider, MD  cloNIDine (CATAPRES) 0.3 MG tablet Take 0.3 mg by mouth 2 (two) times daily.   Yes Historical Provider, MD  Skin Protectants, Misc. (EUCERIN) cream Apply 1 application topically daily as needed for dry skin.    Yes Historical Provider, MD    Allergies  Allergen Reactions  . Contrast Media [Iodinated Diagnostic Agents] Anaphylaxis  . Procardia  [Nifedipine] Other (See Comments)    Sweating, BP drops dangerously low,  . Vancomycin Anaphylaxis and Hives     Social History:  reports that she has never smoked. She has never used smokeless tobacco. She reports that she does not drink alcohol or use illicit drugs.     Family History  Problem Relation Age of Onset  . Healthy Mother        Physical Exam:  GEN:  Pleasant Well Nourished and Well Developed 43 y.o. African American female examined and in no acute distress; cooperative with exam Filed Vitals:   08/31/14 1740 08/31/14 1945 08/31/14 2000 08/31/14 2045  BP: 202/88 118/66 131/52 128/73  Pulse: 110 78 84   Temp: 98.6 F (37 C)     TempSrc: Oral     Resp: 18 16 13    SpO2: 96% 95% 91%    Blood pressure 128/73, pulse 84, temperature 98.6 F (37 C), temperature source Oral, resp. rate 13, SpO2 91 %. PSYCH: She is alert and oriented x4; does not appear anxious does not appear depressed; affect is normal HEENT: Normocephalic and Atraumatic, Mucous membranes pink; PERRLA; EOM intact; Fundi:  Benign;  No scleral icterus, Nares: Patent, Oropharynx: Clear, Fair Dentition,    Neck:  FROM, No Cervical Lymphadenopathy nor Thyromegaly or Carotid Bruit; No JVD; Breasts:: Not examined CHEST WALL: No tenderness CHEST: Normal respiration, clear to auscultation bilaterally HEART: Regular rate and rhythm; no murmurs rubs or gallops BACK: No kyphosis or scoliosis; No CVA tenderness ABDOMEN: Positive Bowel Sounds, Scaphoid, Obese, Soft Non-Tender; No Masses, No Organomegaly, No Pannus; No Intertriginous candida. 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 Genitalia: not examined PULSES: 2+ and symmetric SKIN: Normal hydration no rash or ulceration CNS:  Alert and Oriented x 4, No Focal Deficits Vascular: pulses palpable throughout    Labs on Admission:  Basic Metabolic Panel:  Recent  Labs Lab 08/31/14 1806  NA 139  K 5.0  CL 97  CO2 24  GLUCOSE 98  BUN 56*  CREATININE 9.00*  CALCIUM 8.2*   Liver Function Tests: No results for input(s): AST, ALT, ALKPHOS, BILITOT, PROT, ALBUMIN in the last 168 hours. No results for input(s): LIPASE, AMYLASE in the last 168 hours. No results for input(s): AMMONIA in the last 168 hours. CBC:  Recent Labs Lab 08/31/14 1806  WBC 4.5  HGB 9.3*  HCT 28.2*  MCV 86.2  PLT 157   Cardiac Enzymes:  Recent Labs Lab 08/31/14 1806  TROPONINI <0.30    BNP (last 3 results)  Recent Labs  08/31/14 1806  PROBNP 46663.0*   CBG: No results for input(s): GLUCAP in the last 168 hours.  Radiological Exams on Admission: Dg Chest 2 View  08/31/2014   CLINICAL DATA:  Chest pain shortness of breath cough 1  day, left-sided chest pain, personal history of dialysis and hypertension  EXAM: CHEST  2 VIEW  COMPARISON:  07/31/2014  FINDINGS: Moderate cardiac enlargement stable. Moderate interstitial prominence increased in conspicuity when compared to the prior study. Small right pleural effusion. Effusion is similar to the prior study.  IMPRESSION: Similar to prior study, there is a small right pleural effusion. There is now moderately prominent bilateral diffuse interstitial change and the pattern is most compatible with interstitial pulmonary edema.   Electronically Signed   By: Esperanza Heiraymond  Rubner M.D.   On: 08/31/2014 19:21     EKG: Independently reviewed. Normal sinuys Rhythm, +RBBB, Old Inferior Infarct changes, rate =94   Assessment/Plan:   43 y.o. female with   Principal Problem:   1.   Chest pain   Telemetry Monitoring   Nitropaste, O2, and ASA Rx     Active Problems:   2.   Pulmonary edema- on Chest X-ray and Pro BNP = 46663.0   Dialysis Team Notified to keep Dialysis Rx    (Did not complete Rx today)    O2 PRN   Last 2D ECHO was 04/2014 revealed LVH, EF = 65%, and Grade 1 Diastolic Dysfxn     3.   ESRD on  hemodialysis   On MWF Schedule        4.   HTN (hypertension)   Continue Clonidine 0.3mg  BID `  Monitor BPs     5.   Anemia of renal disease   Send Anemia Panel     6.   DVT prophylaxis   Heparin SQ TID     Code Status:   FULL CODE Family Communication:    No Family Present Disposition Plan:       Observation  Time spent:  60 MInutes  Ron ParkerJENKINS,Shawan Tosh C [facility] Pager [phone]   If 7AM -7PM Please Contact the Day Rounding Team MD for [facility]  If 7PM-7AM, Please Contact Night-Floor Coverage  [email or web address] Password [email or web address] 08/31/2014, 8:51 PM

## 2014-08-31 NOTE — ED Notes (Signed)
Per EMS: Pt from dialysis 2.5 hours into tx and pt started to not feel well.  Pt has mid chest pain non radiating with n/v, sob, lightheadedness. Pt refused Nitro and aspirin in route.  Pt has pulmonary hypertension.   226/98, 92 HR, 20rr, 99% RA

## 2014-08-31 NOTE — ED Provider Notes (Signed)
CSN: [id number]     Arrival date & time 08/31/14  1728 History   First MD Initiated Contact with Patient 08/31/14 1738     Chief Complaint  Patient presents with  . Chest Pain     (Consider location/radiation/quality/duration/timing/severity/associated sxs/prior Treatment) HPI Comments: This is a 43 year old female with a past medical history of end-stage renal disease on dialysis (M,W,F), heart murmur, migraines, ascites and hypertension who presents to the emergency department from dialysis complaining of gradual onset left-sided chest pain beginning about 2 1/2 hours into her dialysis treatment, about 1 hour prior to arrival. Patient states she was feeling fine prior to her dialysis treatment, however throughout the treatment she started to not feel well. Chest pain is non-radiating, initially rated 10/10, 7/10 on arrival to the ED. She did not take any medication for her pain. She refused nitroglycerin and aspirin en route. At onset of chest pain, she had associated nausea and felt like she was "regurgitating", however is no longer nauseated. Despite triage summary, patient denies shortness of breath. She reports she is starting to get a generalized headache. Denies fever, chills, abdominal pain. She does get paracentesis for ascites, last paracentesis treatment was 2 weeks ago, she is due to get it again next week. She does not make urine.  Patient is a 43 y.o. female presenting with chest pain. The history is provided by the patient.  Chest Pain Associated symptoms: headache and nausea (subsided)     Past Medical History  Diagnosis Date  . 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 Hypertension   . ESRD on hemodialysis 04/20/2014    Patient started HD in 1998.  She has been dialyzed in [place] at "[facility]" until moving to [place] in July 2015 to live with family.  She has had failed accesses in the L arm.  No attempt to place access was made in the  R arm, patient is not sure why.  She has a L thigh AVG which she says has been functional for 7-8 years.  They stopped giving her heparin several years ago due to prolonged access bleeding.    Marland Kitchen. Heart murmur   . Anemia of chronic disease   . History of blood transfusion     "several; w/transplant, infections, etc"  . Headache(784.0)     "often; sometimes daily" (04/26/2014)  . Migraines     "qod" (04/26/2014)  . ESRD on hemodialysis 04/20/2014    Patient started HD in 1998.  She has been dialyzed in [place] at "[facility]" until moving to [place] in July 2015 to live with family.  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  She has had failed accesses in the L arm.  No attempt to place access was made in the R arm, patient is not sure why.  She has a L thigh AVG which she says has been functional for  . Ascites    Past Surgical History  Procedure Laterality Date  . Arteriovenous graft placement Left 2013    thigh  . 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 Nephrectomy transplanted organ  2013   Family History  Problem Relation Age of Onset  . Healthy Mother    History  Substance Use Topics  . Smoking status: Never Smoker   . Smokeless tobacco: Never Used  . Alcohol Use: No  OB History    No data available     Review of Systems  Cardiovascular: Positive for chest pain.  Gastrointestinal: Positive for nausea (subsided).  Neurological: Positive for headaches.  All other systems reviewed and are negative.     Allergies  Contrast media; Procardia; and Vancomycin  Home Medications   Prior to Admission medications   Medication Sig Start Date End Date Taking? Authorizing Provider  calcium acetate (PHOSLO) 667 MG capsule Take 1,334 mg by mouth 3 (three) times daily with meals.   Yes Historical Provider, MD  cloNIDine (CATAPRES) 0.3 MG tablet Take 0.3 mg by mouth 2 (two) times daily.   Yes  Historical Provider, MD  Skin Protectants, Misc. (EUCERIN) cream Apply 1 application topically daily as needed for dry skin.    Yes Historical Provider, MD   BP 118/66 mmHg  Pulse 78  Temp(Src) 98.6 F (37 C) (Oral)  Resp 16  SpO2 95% Physical Exam  Constitutional: She is oriented to person, place, and time. She appears well-developed and well-nourished. No distress.  HENT:  Head: Normocephalic and atraumatic.  Mouth/Throat: Oropharynx is clear and moist.  Eyes: Conjunctivae and EOM are normal. Pupils are equal, round, and reactive to light.  Neck: Normal range of motion. Neck supple. No JVD present.  Cardiovascular: Regular rhythm, normal heart sounds and intact distal pulses.   Tachycardic. No extremity edema.  Pulmonary/Chest: Effort normal and breath sounds normal. No respiratory distress. She has no wheezes. She has no rales.  Abdominal: Soft. Bowel sounds are normal. There is no tenderness.  Protuberant abdomen. Non-tender.  Musculoskeletal: Normal range of motion. She exhibits no edema.  Neurological: She is alert and oriented to person, place, and time. She has normal strength. No sensory deficit.  Speech fluent, goal oriented. Moves limbs without ataxia. Equal grip strength bilateral.  Skin: Skin is warm and dry. She is not diaphoretic.  Psychiatric: She has a normal mood and affect. Her behavior is normal.  Nursing note and vitals reviewed.   ED Course  Procedures (including critical care time) Labs Review Labs Reviewed  CBC - Abnormal; Notable for the following:    RBC 3.27 (*)    Hemoglobin 9.3 (*)    HCT 28.2 (*)    All other components within normal limits  BASIC METABOLIC PANEL - Abnormal; Notable for the following:    BUN 56 (*)    Creatinine, Ser 9.00 (*)    Calcium 8.2 (*)    GFR calc non Af Amer 5 (*)    GFR calc Af Amer 6 (*)    Anion gap 18 (*)    All other components within normal limits  PRO B NATRIURETIC PEPTIDE - Abnormal; Notable for the  following:    Pro B Natriuretic peptide (BNP) 16109.6 (*)    All other components within normal limits  TROPONIN I  I-STAT CG4 LACTIC ACID, ED    Imaging Review Dg Chest 2 View  08/31/2014   CLINICAL DATA:  Chest pain shortness of breath cough 1 day, left-sided chest pain, personal history of dialysis and hypertension  EXAM: CHEST  2 VIEW  COMPARISON:  07/31/2014  FINDINGS: Moderate cardiac enlargement stable. Moderate interstitial prominence increased in conspicuity when compared to the prior study. Small right pleural effusion. Effusion is similar to the prior study.  IMPRESSION: Similar to prior study, there is a small right pleural effusion. There is now moderately prominent bilateral diffuse interstitial change and the pattern is most compatible with interstitial pulmonary edema.  Electronically Signed   By: Esperanza Heir M.D.   On: 08/31/2014 19:21     EKG Interpretation None      MDM   Final diagnoses:  Chest pain  Acute pulmonary edema   Patient presenting from dialysis with chest pain. She completed 2-1/2 hours of her dialysis treatment. She is nontoxic appearing and in no apparent distress. Afebrile, tachycardic and hypertensive on arrival. Chest pain began to improve prior to arrival without any intervention. After receiving Tylenol (pt's medication request) her chest pain has completely subsided. Chest x-ray compatible with interstitial pulmonary edema. BNP [id number]. Heart rate and blood pressure improved in the emergency department. Given patient's history of end-stage renal disease on dialysis, not making urine, will admit patient for observation. She will need dialysis during her admission stay. Admission accepted by Dr. Lovell Sheehan, [facility], observation, telemetry bed. I spoke with Dr. Briant Cedar with nephrology who will see patient tomorrow for dialysis.  Discussed with attending Dr. Criss Alvine who agrees with plan of care.   Kathrynn Speed, PA-C 08/31/14 2016  Kathrynn Speed,  PA-C 08/31/14 [id number]  Audree Camel, MD 09/01/14 [phone]

## 2014-08-31 NOTE — ED Notes (Signed)
Pt from dialysis 2.5 hours into tx and pt started to not feel well.  Pt has mid chest pain non radiating with n/v, sob, lightheadedness. Pt refused Nitro and aspirin in route.  Pt has pulmonary hypertension.

## 2014-09-01 ENCOUNTER — Observation Stay (HOSPITAL_COMMUNITY): Payer: Medicare Other

## 2014-09-01 DIAGNOSIS — R079 Chest pain, unspecified: Secondary | ICD-10-CM | POA: Diagnosis not present

## 2014-09-01 DIAGNOSIS — I10 Essential (primary) hypertension: Secondary | ICD-10-CM

## 2014-09-01 LAB — CBC
HCT: 28.7 % — ABNORMAL LOW (ref 36.0–46.0)
HEMATOCRIT: 28.1 % — AB (ref 36.0–46.0)
Hemoglobin: 9.3 g/dL — ABNORMAL LOW (ref 12.0–15.0)
Hemoglobin: 9.2 g/dL — ABNORMAL LOW (ref 12.0–15.0)
MCH: 28.9 pg (ref 26.0–34.0)
MCH: 29.1 pg (ref 26.0–34.0)
MCHC: 32.4 g/dL (ref 30.0–36.0)
MCHC: 32.7 g/dL (ref 30.0–36.0)
MCV: 89.1 fL (ref 78.0–100.0)
MCV: 88.9 fL (ref 78.0–100.0)
PLATELETS: 164 10*3/uL (ref 150–400)
Platelets: 183 10*3/uL (ref 150–400)
RBC: 3.22 MIL/uL — ABNORMAL LOW (ref 3.87–5.11)
RBC: 3.16 MIL/uL — ABNORMAL LOW (ref 3.87–5.11)
RDW: 15 % (ref 11.5–15.5)
RDW: 14.9 % (ref 11.5–15.5)
WBC: 5.6 10*3/uL (ref 4.0–10.5)
WBC: 5.6 10*3/uL (ref 4.0–10.5)

## 2014-09-01 LAB — BASIC METABOLIC PANEL
Anion gap: 16 — ABNORMAL HIGH (ref 5–15)
BUN: 64 mg/dL — ABNORMAL HIGH (ref 6–23)
CO2: 23 mEq/L (ref 19–32)
CREATININE: 10.18 mg/dL — AB (ref 0.50–1.10)
Calcium: 8.3 mg/dL — ABNORMAL LOW (ref 8.4–10.5)
Chloride: 96 mEq/L (ref 96–112)
GFR, EST AFRICAN AMERICAN: 5 mL/min — AB (ref >90)
GFR, EST NON AFRICAN AMERICAN: 4 mL/min — AB (ref >90)
Glucose, Bld: 140 mg/dL — ABNORMAL HIGH (ref 70–99)
Potassium: 5.8 mEq/L — ABNORMAL HIGH (ref 3.7–5.3)
SODIUM: 135 meq/L — AB (ref 137–147)

## 2014-09-01 LAB — RENAL FUNCTION PANEL
ALBUMIN: 2 g/dL — AB (ref 3.5–5.2)
Anion gap: 18 — ABNORMAL HIGH (ref 5–15)
BUN: 68 mg/dL — AB (ref 6–23)
CO2: 23 mEq/L (ref 19–32)
CREATININE: 10.31 mg/dL — AB (ref 0.50–1.10)
Calcium: 8.3 mg/dL — ABNORMAL LOW (ref 8.4–10.5)
Chloride: 96 mEq/L (ref 96–112)
GFR calc Af Amer: 5 mL/min — ABNORMAL LOW (ref >90)
GFR, EST NON AFRICAN AMERICAN: 4 mL/min — AB (ref >90)
Glucose, Bld: 122 mg/dL — ABNORMAL HIGH (ref 70–99)
POTASSIUM: 5.8 meq/L — AB (ref 3.7–5.3)
Phosphorus: 8.3 mg/dL — ABNORMAL HIGH (ref 2.3–4.6)
Sodium: 137 mEq/L (ref 137–147)

## 2014-09-01 LAB — HEPATIC FUNCTION PANEL
ALBUMIN: 1.9 g/dL — AB (ref 3.5–5.2)
ALT: 13 U/L (ref 0–35)
AST: 13 U/L (ref 0–37)
Alkaline Phosphatase: 127 U/L — ABNORMAL HIGH (ref 39–117)
BILIRUBIN TOTAL: 0.2 mg/dL — AB (ref 0.3–1.2)
Bilirubin, Direct: <0.2 mg/dL (ref 0.0–0.3)
TOTAL PROTEIN: 5.7 g/dL — AB (ref 6.0–8.3)

## 2014-09-01 LAB — TROPONIN I
Troponin I: <0.3 ng/mL (ref <0.30)
Troponin I: <0.3 ng/mL (ref <0.30)

## 2014-09-01 LAB — LIPASE, BLOOD: LIPASE: 27 U/L (ref 11–59)

## 2014-09-01 MED ORDER — DARBEPOETIN ALFA 60 MCG/0.3ML IJ SOSY
PREFILLED_SYRINGE | INTRAMUSCULAR | Status: AC
  Filled 2014-09-01: qty 0.3

## 2014-09-01 MED ORDER — TECHNETIUM TO 99M ALBUMIN AGGREGATED
6.0000 | Freq: Once | INTRAVENOUS | Status: AC | PRN
  Administered 2014-09-01: 6 via INTRAVENOUS

## 2014-09-01 MED ORDER — SODIUM CHLORIDE 0.9 % IV SOLN: 100.0000 mL | INTRAVENOUS | Status: DC | PRN

## 2014-09-01 MED ORDER — HEPARIN SODIUM (PORCINE) 1000 UNIT/ML DIALYSIS
1000.0000 [IU] | INTRAMUSCULAR | Status: DC | PRN
  Filled 2014-09-01: qty 1

## 2014-09-01 MED ORDER — DOXERCALCIFEROL 4 MCG/2ML IV SOLN
6.0000 ug | Freq: Once | INTRAVENOUS | Status: AC
  Administered 2014-09-01: 6 ug via INTRAVENOUS
  Filled 2014-09-01: qty 4

## 2014-09-01 MED ORDER — PANTOPRAZOLE SODIUM 40 MG PO TBEC: 40.0000 mg | DELAYED_RELEASE_TABLET | Freq: Every day | ORAL | Status: DC

## 2014-09-01 MED ORDER — ASPIRIN EC 81 MG PO TBEC: 81.0000 mg | DELAYED_RELEASE_TABLET | Freq: Every day | ORAL | Status: AC

## 2014-09-01 MED ORDER — ISOSORBIDE MONONITRATE ER 30 MG PO TB24: 30.0000 mg | ORAL_TABLET | Freq: Every day | ORAL | Status: DC

## 2014-09-01 MED ORDER — ALTEPLASE 2 MG IJ SOLR
2.0000 mg | Freq: Once | INTRAMUSCULAR | Status: DC | PRN
  Filled 2014-09-01: qty 2

## 2014-09-01 MED ORDER — LIDOCAINE HCL (PF) 1 % IJ SOLN: 5.0000 mL | INTRAMUSCULAR | Status: DC | PRN

## 2014-09-01 MED ORDER — TECHNETIUM TC 99M DIETHYLENETRIAME-PENTAACETIC ACID: 40.0000 | Freq: Once | INTRAVENOUS | Status: DC | PRN

## 2014-09-01 MED ORDER — PENTAFLUOROPROP-TETRAFLUOROETH EX AERO: 1.0000 "application " | INHALATION_SPRAY | CUTANEOUS | Status: DC | PRN

## 2014-09-01 MED ORDER — SODIUM CHLORIDE 0.9 % IV SOLN
62.5000 mg | Freq: Once | INTRAVENOUS | Status: AC
  Administered 2014-09-01: 62.5 mg via INTRAVENOUS
  Filled 2014-09-01 (×2): qty 5

## 2014-09-01 MED ORDER — LIDOCAINE-PRILOCAINE 2.5-2.5 % EX CREA
1.0000 "application " | TOPICAL_CREAM | CUTANEOUS | Status: DC | PRN
  Filled 2014-09-01: qty 5

## 2014-09-01 MED ORDER — NEPRO/CARBSTEADY PO LIQD
237.0000 mL | ORAL | Status: DC | PRN
  Filled 2014-09-01: qty 237

## 2014-09-01 MED ORDER — DARBEPOETIN ALFA 60 MCG/0.3ML IJ SOSY
60.0000 ug | PREFILLED_SYRINGE | Freq: Once | INTRAMUSCULAR | Status: AC
  Administered 2014-09-01: 60 ug via INTRAVENOUS
  Filled 2014-09-01: qty 0.3

## 2014-09-01 MED ORDER — DOXERCALCIFEROL 4 MCG/2ML IV SOLN
INTRAVENOUS | Status: AC
  Filled 2014-09-01: qty 4

## 2014-09-01 NOTE — Evaluation (Signed)
Physical Therapy Evaluation Patient Details Name: Augusto GambleLeslie Dimascio MRN: [id number] DOB: 12/09/1970 Today's Date: 09/01/2014   History of Present Illness  Patient is a 43 yo female admitted 08/31/14 with chest pain, nausea, and SOB while on HD.  PMH: ESRD on HD, s/p failed renal transplant, HTN, anemia, migraines.  Clinical Impression  Patient is functioning at Mod I level with mobility and gait.  Good balance with gait without assistive device.  Encouraged patient to ambulate frequently with nursing to increase activity tolerance.  No acute PT needs identified - PT will sign off.    Follow Up Recommendations No PT follow up;Supervision - Intermittent    Equipment Recommendations  None recommended by PT    Recommendations for Other Services       Precautions / Restrictions Precautions Precautions: None Restrictions Weight Bearing Restrictions: No      Mobility  Bed Mobility Overal bed mobility: Modified Independent             General bed mobility comments: Use of bed rail  Transfers Overall transfer level: Modified independent Equipment used: None             General transfer comment: Increased time for transfers.  Good balance in standing  Ambulation/Gait Ambulation/Gait assistance: Modified independent (Device/Increase time) Ambulation Distance (Feet): 78 Feet Assistive device: None Gait Pattern/deviations: Step-through pattern;Decreased stride length Gait velocity: Decreased Gait velocity interpretation: Below normal speed for age/gender General Gait Details: Patient with good gait balance and gait pattern.  Patient with decreased gait speed.  Reports "feels tired".  Good balance with no assistive device.  Stairs            Wheelchair Mobility    Modified Rankin (Stroke Patients Only)       Balance Overall balance assessment: No apparent balance deficits (not formally assessed)                                            Pertinent Vitals/Pain Pain Assessment: No/denies pain ("Just tired")    Home Living Family/patient expects to be discharged to:: Private residence Living Arrangements: Other relatives (Aunt and aunt's husband) Available Help at Discharge: Family;Available 24 hours/day Type of Home: House Home Access: Stairs to enter Entrance Stairs-Rails: Doctor, general practiceight;Left Entrance Stairs-Number of Steps: 5 Home Layout: One level Home Equipment: Environmental consultantWalker - 2 wheels      Prior Function Level of Independence: Independent               Hand Dominance        Extremity/Trunk Assessment   Upper Extremity Assessment: Generalized weakness           Lower Extremity Assessment: Generalized weakness      Cervical / Trunk Assessment: Normal  Communication   Communication: No difficulties  Cognition Arousal/Alertness: Awake/alert Behavior During Therapy: WFL for tasks assessed/performed Overall Cognitive Status: Within Functional Limits for tasks assessed                      General Comments      Exercises        Assessment/Plan    PT Assessment Patent does not need any further PT services  PT Diagnosis Abnormality of gait   PT Problem List    PT Treatment Interventions     PT Goals (Current goals can be found in the Care Plan section) Acute Rehab PT Goals  PT Goal Formulation: All assessment and education complete, DC therapy    Frequency     Barriers to discharge        Co-evaluation               End of Session   Activity Tolerance: Patient limited by fatigue Patient left: in bed;with call bell/phone within reach Nurse Communication: Mobility status (Encouraged ambulation with nursing staff)    Functional Assessment Tool Used: Clinical judgement Functional Limitation: Mobility: Walking and moving around Mobility: Walking and Moving Around Current Status (O0321): 0 percent impaired, limited or restricted Mobility: Walking and Moving Around Goal  Status [phone]): 0 percent impaired, limited or restricted Mobility: Walking and Moving Around Discharge Status [phone]): 0 percent impaired, limited or restricted    Time: 1412-1430 PT Time Calculation (min) (ACUTE ONLY): 18 min   Charges:   PT Evaluation $Initial PT Evaluation Tier I: 1 Procedure PT Treatments $Gait Training: 8-22 mins   PT G Codes:   Functional Assessment Tool Used: Clinical judgement Functional Limitation: Mobility: Walking and moving around    Vena Austria 09/01/2014, 3:53 PM Durenda Hurt. Renaldo Fiddler, [facility] Acute Rehab Services Pager [phone]

## 2014-09-01 NOTE — Plan of Care (Signed)
Problem: Discharge Progression Outcomes Goal: Activity appropriate for discharge plan Outcome: Completed/Met Date Met:  09/01/14

## 2014-09-01 NOTE — Plan of Care (Signed)
Problem: Phase I Progression Outcomes Goal: Hemodynamically stable Outcome: Completed/Met Date Met:  09/01/14 Goal: Anginal pain relieved Outcome: Completed/Met Date Met:  09/01/14 Goal: Voiding-avoid urinary catheter unless indicated Outcome: Not Applicable Date Met:  91/63/84

## 2014-09-01 NOTE — Progress Notes (Signed)
MD paged for diet per pt request. Holly Mendoza 8:15 AM

## 2014-09-01 NOTE — Progress Notes (Signed)
UR completed 

## 2014-09-01 NOTE — Progress Notes (Addendum)
MD paged, clarification on if patient will be discharged after HD. Louie Bun S. 6:00 PM   Pt to be discharged after dialysis per MD, will place orders. Darrel Hoover 6:08 PM

## 2014-09-01 NOTE — Procedures (Signed)
Patient was seen on dialysis and the procedure was supervised.  BFR 150  Via AVG BP is  166/95.   Patient appears to be tolerating treatment well- she is just being put on machine  Holly Mendoza A 09/01/2014

## 2014-09-01 NOTE — Consult Note (Signed)
[facility] Renal Consultation Note  Indication for Consultation:  Management of ESRD/hemodialysis; anemia, hypertension/volume and secondary hyperparathyroidism  HPI: Holly Mendoza is a 43 y.o. AA female with a history of recurrent ascites, believed to be secondary to pulmonary hypertension and requiring frequent paracentesis, and ESRD on dialysis since 1998 with failed renal transplant in 2012 and nephrectomy of transplanted kidney in 2013, now on dialysis at the [facility] with poor compliance, who had sudden onset of left chest pain with nausea and vomiting yesterday during dialysis and was sent to the ED for evaluation.  EKG showed normal sinus rhythm with RBBB and old infarct changes, and troponins were negative.  Her symptoms have resolved, and she currently has no complaints.  She had only 2:06 of her scheduled 4-hour dialysis, but often runs between 2 and 3 hours and only two days a week.  Her chest x-ray suggests interstitial pulmonary edema and a small right effusion, and her potassium was 5 yesterday.  She feels better and wants to go home  Dialysis Orders:  MWF @ [place]  4 hr    76 kg    2K/2.5Ca      400/800     No Heparin     L thigh AVG        No Hectorol           Aranesp 60 mcg on Wed       Venofer 50 mg on Wed  Past Medical History  Diagnosis Date  . 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 Hypertension   . ESRD on hemodialysis 04/20/2014    Patient started HD in 1998.  She has been dialyzed in [place] at "[facility]" until moving to [place] in July 2015 to live with family.  She has had failed accesses in the L arm.  No attempt to place access was made in the R arm, patient is not sure why.  She has a L thigh AVG which she says has been functional for 7-8 years.  They stopped giving her heparin several years ago due to prolonged access bleeding.    Marland Kitchen Heart murmur   . Anemia of chronic disease   . History  of blood transfusion     "several; w/transplant, infections, etc"  . Headache(784.0)     "often; sometimes daily" (04/26/2014)  . Migraines     "qod" (04/26/2014)  . ESRD on hemodialysis 04/20/2014    Patient started HD in 1998.  She has been dialyzed in [place] at "[facility]" until moving to [place] in July 2015 to live with family.  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  She has had failed accesses in the L arm.  No attempt to place access was made in the R arm, patient is not sure why.  She has a L thigh AVG which she says has been functional for  . Ascites    Past Surgical History  Procedure Laterality Date  . Arteriovenous graft placement Left 2013    thigh  . 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 Nephrectomy transplanted organ  2013   Family History  Problem Relation Age of Onset  . Healthy Mother    Social History She denies any history of tobacco, alcohol, or illicit drug use.   Allergies  Allergen Reactions  . Contrast  Media [Iodinated Diagnostic Agents] Anaphylaxis  . Procardia [Nifedipine] Other (See Comments)    Sweating, BP drops dangerously low,  . Vancomycin Anaphylaxis and Hives   Prior to Admission medications   Medication Sig Start Date End Date Taking? Authorizing Provider  calcium acetate (PHOSLO) 667 MG capsule Take 1,334 mg by mouth 3 (three) times daily with meals.   Yes Historical Provider, MD  cloNIDine (CATAPRES) 0.3 MG tablet Take 0.3 mg by mouth 2 (two) times daily.   Yes Historical Provider, MD  Skin Protectants, Misc. (EUCERIN) cream Apply 1 application topically daily as needed for dry skin.    Yes Historical Provider, MD   Labs:  Results for orders placed or performed during the hospital encounter of 08/31/14 (from the past 48 hour(s))  CBC     Status: Abnormal   Collection Time: 08/31/14  6:06 PM  Result Value Ref Range   WBC 4.5 4.0 - 10.5 K/uL   RBC  3.27 (L) 3.87 - 5.11 MIL/uL   Hemoglobin 9.3 (L) 12.0 - 15.0 g/dL   HCT 28.2 (L) 36.0 - 46.0 %   MCV 86.2 78.0 - 100.0 fL   MCH 28.4 26.0 - 34.0 pg   MCHC 33.0 30.0 - 36.0 g/dL   RDW 14.7 11.5 - 15.5 %   Platelets 157 150 - 400 K/uL  Basic metabolic panel     Status: Abnormal   Collection Time: 08/31/14  6:06 PM  Result Value Ref Range   Sodium 139 137 - 147 mEq/L   Potassium 5.0 3.7 - 5.3 mEq/L   Chloride 97 96 - 112 mEq/L   CO2 24 19 - 32 mEq/L   Glucose, Bld 98 70 - 99 mg/dL   BUN 56 (H) 6 - 23 mg/dL   Creatinine, Ser 9.00 (H) 0.50 - 1.10 mg/dL   Calcium 8.2 (L) 8.4 - 10.5 mg/dL   GFR calc non Af Amer 5 (L) >90 mL/min   GFR calc Af Amer 6 (L) >90 mL/min    Comment: (NOTE) The eGFR has been calculated using the CKD EPI equation. This calculation has not been validated in all clinical situations. eGFR's persistently <90 mL/min signify possible Chronic Kidney Disease.    Anion gap 18 (H) 5 - 15  Pro b natriuretic peptide (BNP)     Status: Abnormal   Collection Time: 08/31/14  6:06 PM  Result Value Ref Range   Pro B Natriuretic peptide (BNP) 46663.0 (H) 0 - 125 pg/mL  Troponin I     Status: None   Collection Time: 08/31/14  6:06 PM  Result Value Ref Range   Troponin I <0.30 <0.30 ng/mL    Comment:        Due to the release kinetics of cTnI, a negative result within the first hours of the onset of symptoms does not rule out myocardial infarction with certainty. If myocardial infarction is still suspected, repeat the test at appropriate intervals.   I-Stat CG4 Lactic Acid, ED     Status: None   Collection Time: 08/31/14  6:16 PM  Result Value Ref Range   Lactic Acid, Venous 0.74 0.5 - 2.2 mmol/L  Troponin I (q 6hr x 3)     Status: None   Collection Time: 08/31/14 11:22 PM  Result Value Ref Range   Troponin I <0.30 <0.30 ng/mL    Comment:        Due to the release kinetics of cTnI, a negative result within the first hours of the onset of  symptoms does not rule  out myocardial infarction with certainty. If myocardial infarction is still suspected, repeat the test at appropriate intervals.   Troponin I (q 6hr x 3)     Status: None   Collection Time: 09/01/14  5:30 AM  Result Value Ref Range   Troponin I <0.30 <0.30 ng/mL    Comment:        Due to the release kinetics of cTnI, a negative result within the first hours of the onset of symptoms does not rule out myocardial infarction with certainty. If myocardial infarction is still suspected, repeat the test at appropriate intervals.    Constitutional: negative for chills, fatigue, fevers and sweats Ears, nose, mouth, throat, and face: negative for earaches, hoarseness, nasal congestion and sore throat Respiratory: negative for cough, dyspnea on exertion, hemoptysis and sputum Cardiovascular: negative for chest pain, chest pressure/discomfort, dyspnea, orthopnea and palpitations Gastrointestinal: negative for abdominal pain, change in bowel habits, nausea and vomiting Genitourinary:negative, anuric Musculoskeletal:negative for arthralgias, back pain, myalgias and neck pain Neurological: negative for dizziness, gait problems, headaches, paresthesia and speech problems  Physical Exam: Filed Vitals:   09/01/14 0659  BP: 162/91  Pulse: 81  Temp: 98 F (36.7 C)  Resp: 18     General appearance: alert, cooperative and no distress Head: Normocephalic, without obvious abnormality, atraumatic Neck: no adenopathy, no carotid bruit, no JVD and supple, symmetrical, trachea midline Resp: clear to auscultation bilaterally Cardio: RRR with Gr II/VI systolic murmur, no rub GI: slightly distended abdomen, soft and nontender Extremities: leathery skin of LEs with hyperpigmentation, 1+ edema Neurologic: Grossly normal Dialysis Access: L thigh AVG with + bruit   Assessment/Plan: 1. Chest pain - with N & V during HD, resolved; EKG with no acute findings, negative troponins. Unfortunately this is a  common theme- she has big goals, cant tolerate then cuts her treatment short to make even bigger goal the next time 2. ESRD - HD on MWF @ [place], noncompliant, runs only twice a week and never full time (2:09 yesterday), K 5.  HD today, if pt agrees. I told her this was what we were recommending 3. Hypertension/volume - BP 162/91, chronically elevated, on Clonidine 0.3 mg bid; wt 78.7 kg, never stays on HD to achieve UF goal. 4. Anemia - Hgb 9.3, on Aranesp 60 mcg & Fe on Wed, but misses Txs, last received meds on 75/6. 5. Metabolic bone disease - Ca 8.2, last P 11.9, iPTH 1890; Hectorol 6 mcg, Phoslo 2 with meals, but noncompliant with HD and binders. 6. Nutrition - Last Alb 2.7, renal diet. 7. Ascites - recurrent with frequent paracentesis, last on 10/28 with 7.2 L removed.   LYLES,CHARLES 09/01/2014, 9:04 AM   Attending Nephrologist: Corliss Parish, MD  Patient seen and examined, agree with above note with above modifications. 43 year old non compliant HD patient who comes in with CP on HD. So far cardiac work up is negative and plans are for discharge.  I think she would benefit from dialysis to day given high K and overall high volume status.  She could still go home afterwards.    Corliss Parish, MD 09/01/2014

## 2014-09-01 NOTE — Progress Notes (Signed)
Discharge instructions given to patient. Questions answered. Pt voiced understand and took instructions with her. NT escorted pt to exit where family is waiting.

## 2014-09-01 NOTE — Progress Notes (Signed)
When reviewing pt chart CV strip was not found for this admission. [employer] was notified. I was told that there was a strip put in but could not view it on my end. I was given the rate and interval's per [facility]. Pt still currently NSR on monitor. Will continue to monitor.

## 2014-09-01 NOTE — Discharge Summary (Addendum)
Physician Discharge Summary  Holly Mendoza MRN: [id number] DOB/AGE: 21-Mar-1971 43 y.o.  PCP: PROVIDER NOT IN SYSTEM   Admit date: 08/31/2014 Discharge date: 09/01/2014  Discharge Diagnoses:     Chest pain, atypical could be secondary to evolving hypertension Portal pulmonary hypertension   ESRD on hemodialysis Monday Wednesday Friday   HTN (hypertension)   Pulmonary edema   Anemia of renal disease Recurrent ascites requiring weekly paracentesis  Follow-up recommendations   follow-up with PCP in 5-7 days Follow-up with cardiology as scheduled  .   Medication List    TAKE these medications        aspirin EC 81 MG tablet  Take 1 tablet (81 mg total) by mouth daily.     calcium acetate 667 MG capsule  Commonly known as:  PHOSLO  Take 1,334 mg by mouth 3 (three) times daily with meals.     cloNIDine 0.3 MG tablet  Commonly known as:  CATAPRES  Take 0.3 mg by mouth 2 (two) times daily.     eucerin cream  Apply 1 application topically daily as needed for dry skin.     isosorbide mononitrate 30 MG 24 hr tablet  Commonly known as:  IMDUR  Take 1 tablet (30 mg total) by mouth daily.        Discharge Condition: stable  Disposition: 01-Home or Self Care   Consults:  Cardiology  Significant Diagnostic Studies: Dg Chest 2 View  08/31/2014   CLINICAL DATA:  Chest pain shortness of breath cough 1 day, left-sided chest pain, personal history of dialysis and hypertension  EXAM: CHEST  2 VIEW  COMPARISON:  07/31/2014  FINDINGS: Moderate cardiac enlargement stable. Moderate interstitial prominence increased in conspicuity when compared to the prior study. Small right pleural effusion. Effusion is similar to the prior study.  IMPRESSION: Similar to prior study, there is a small right pleural effusion. There is now moderately prominent bilateral diffuse interstitial change and the pattern is most compatible with interstitial pulmonary edema.   Electronically Signed   By:  Skipper Cliche M.D.   On: 08/31/2014 19:21   US Paracentesis  08/15/2014   INDICATION: Pulmonary artery hypertension, portal hypertension, end-stage renal disease, recurrent ascites. Request is made for therapeutic paracentesis.  EXAM: ULTRASOUND-GUIDED THERAPEUTIC PARACENTESIS  COMPARISON:  PRIOR PARACENTESIS ON 07/19/2014  MEDICATIONS: None.  COMPLICATIONS: None immediate  TECHNIQUE: Informed written consent was obtained from the patient after a discussion of the risks, benefits and alternatives to treatment. A timeout was performed prior to the initiation of the procedure.  Initial ultrasound scanning demonstrates a large amount of ascites within the right mid to lower abdominal quadrant. The right mid to lower abdomen was prepped and draped in the usual sterile fashion. 1% lidocaine was used for local anesthesia. Under direct ultrasound guidance, a 19 gauge, 10 cm, Yueh catheter was introduced. An ultrasound image was saved for documentation purposed. The paracentesis was performed. The catheter was removed and a dressing was applied. The patient tolerated the procedure well without immediate post procedural complication.  FINDINGS: A total of approximately 7.2 liters of yellow fluid was removed.  IMPRESSION: Successful ultrasound-guided therapeutic paracentesis yielding 7.2 liters of peritoneal fluid.  Read by: Rowe Robert, PA-C   Electronically Signed   By: Aletta Edouard M.D.   On: 08/15/2014 16:31      Microbiology: No results found for this or any previous visit (from the past 240 hour(s)).   Labs: Results for orders placed or performed during the hospital  encounter of 08/31/14 (from the past 48 hour(s))  CBC     Status: Abnormal   Collection Time: 08/31/14  6:06 PM  Result Value Ref Range   WBC 4.5 4.0 - 10.5 K/uL   RBC 3.27 (L) 3.87 - 5.11 MIL/uL   Hemoglobin 9.3 (L) 12.0 - 15.0 g/dL   HCT 28.2 (L) 36.0 - 46.0 %   MCV 86.2 78.0 - 100.0 fL   MCH 28.4 26.0 - 34.0 pg   MCHC 33.0 30.0  - 36.0 g/dL   RDW 14.7 11.5 - 15.5 %   Platelets 157 150 - 400 K/uL  Basic metabolic panel     Status: Abnormal   Collection Time: 08/31/14  6:06 PM  Result Value Ref Range   Sodium 139 137 - 147 mEq/L   Potassium 5.0 3.7 - 5.3 mEq/L   Chloride 97 96 - 112 mEq/L   CO2 24 19 - 32 mEq/L   Glucose, Bld 98 70 - 99 mg/dL   BUN 56 (H) 6 - 23 mg/dL   Creatinine, Ser 9.00 (H) 0.50 - 1.10 mg/dL   Calcium 8.2 (L) 8.4 - 10.5 mg/dL   GFR calc non Af Amer 5 (L) >90 mL/min   GFR calc Af Amer 6 (L) >90 mL/min    Comment: (NOTE) The eGFR has been calculated using the CKD EPI equation. This calculation has not been validated in all clinical situations. eGFR's persistently <90 mL/min signify possible Chronic Kidney Disease.    Anion gap 18 (H) 5 - 15  Pro b natriuretic peptide (BNP)     Status: Abnormal   Collection Time: 08/31/14  6:06 PM  Result Value Ref Range   Pro B Natriuretic peptide (BNP) 46663.0 (H) 0 - 125 pg/mL  Troponin I     Status: None   Collection Time: 08/31/14  6:06 PM  Result Value Ref Range   Troponin I <0.30 <0.30 ng/mL    Comment:        Due to the release kinetics of cTnI, a negative result within the first hours of the onset of symptoms does not rule out myocardial infarction with certainty. If myocardial infarction is still suspected, repeat the test at appropriate intervals.   I-Stat CG4 Lactic Acid, ED     Status: None   Collection Time: 08/31/14  6:16 PM  Result Value Ref Range   Lactic Acid, Venous 0.74 0.5 - 2.2 mmol/L  Troponin I (q 6hr x 3)     Status: None   Collection Time: 08/31/14 11:22 PM  Result Value Ref Range   Troponin I <0.30 <0.30 ng/mL    Comment:        Due to the release kinetics of cTnI, a negative result within the first hours of the onset of symptoms does not rule out myocardial infarction with certainty. If myocardial infarction is still suspected, repeat the test at appropriate intervals.   Troponin I (q 6hr x 3)     Status: None    Collection Time: 09/01/14  5:30 AM  Result Value Ref Range   Troponin I <0.30 <0.30 ng/mL    Comment:        Due to the release kinetics of cTnI, a negative result within the first hours of the onset of symptoms does not rule out myocardial infarction with certainty. If myocardial infarction is still suspected, repeat the test at appropriate intervals.      HPI :Holly Mendoza is a 43 yo female with a history ESRD on HD  s/p failed kidney tx 2012, HTN, ascites (was going for weekly paracentesis in [place]). History of pulmonary HTN.presented to the ED on 11/13 with complaints of midsternal chest pain, nausea vomiting, shortness of breath and dizziness. chest pain beginning about 2 1/2 hours into her dialysis treatment, about 1 hour prior to arrival. Patient states she was feeling fine prior to her dialysis treatment, however throughout the treatment she started to not feel well. Chest pain is non-radiating, initially rated 10/10, 7/10 on arrival to the ED. She did not take any medication for her pain. She refused nitroglycerin and aspirin en route. At onset of chest pain, she had associated nausea and felt like she was "regurgitating", however is no longer nauseatedShe does get paracentesis for ascites, last paracentesis treatment was 2 weeks ago, she is due to get it again next week. She does not make urine. Patient was admitted for evaluation of her chest pain.   recent TTE 05/01/14: w moderate LVH, MV thickening and prolapse, estimated peak PASP 42mHg  ECHO (04/2014): EF 65-70%, grade I DD, RV nl, mod TR  [place] and previously was getting paracentesis once weekly. HD on M/W/F. Dry weight 80 kg and BP never really comes down with HD but when gets paracentesis BP comes down.  Follows a renal/low salt diet. Drinks less than 2L a day. [place] she had liver [place] and negative for cirrhosis. Denies snoring.   HOSPITAL COURSE: Chest pain Patient admitted to telemetry, cardiac  enzymes negative History of pulmonary hypertension which could be contribution to her chest pain. Associated symptoms of nausea vomiting could potentially suggest a noncardiac etiology Previous history of right heart catheterization Recently saw  DBenay Spicewho had recommended a VQ scan We will do a VQ scan prior to discharge Patient to follow-up with cardiology next week .cardiology indeterminate further workup for her chest pain Patient started on imdur and  aspirin  Pulmonary edema Patient not particularly hypoxic despite chest x-ray showing pulmonary edema Appears quite comfortable in no respiratory distress  End-stage renal disease Monday Wednesday Friday  Nausea vomiting Resolved We'll check hepatic panel and lipase  Hypertension Patient on clonidine, will also start the patient on Imdur  Anemia of chronic disease PCP to follow CBC  Recurrent ascites Weekly paracentesis  Discharge Exam:    Blood pressure 162/91, pulse 81, temperature 98 F (36.7 C), temperature source Oral, resp. rate 18, height _0  (1.753 m), weight 78.744 kg (173 lb 9.6 oz), SpO2 99 %.  General: Well appearing. No respiratory difficulty HEENT: normal Neck: supple. JVD 9-10. Carotids 2+ bilat; no bruits. No lymphadenopathy or thryomegaly appreciated. Cor: PMI nondisplaced. Regular rate & rhythm. No rubs or gallops; 3/6 SEM TR Lungs: clear Abdomen: soft, nontender, +++distended. No hepatosplenomegaly. No bruits or masses. Good bowel sounds. Extremities: no cyanosis, clubbing, rash, trace bilateral edema, L thigh horseshoe graft Neuro: alert & oriented x 3, cranial nerves grossly intact. moves all 4 extremities w/o difficulty. Affect pleasant.        Signed:Reyne Dumas11/14/2015, 10:57 AM

## 2014-09-06 MED ORDER — ESOMEPRAZOLE MAGNESIUM 40 MG PO CPDR: 40.0000 mg | DELAYED_RELEASE_CAPSULE | Freq: Every day | ORAL | Status: DC

## 2014-09-11 ENCOUNTER — Encounter (HOSPITAL_COMMUNITY): Payer: Self-pay | Admitting: *Deleted

## 2014-09-11 ENCOUNTER — Ambulatory Visit (HOSPITAL_COMMUNITY)
Admission: RE | Admit: 2014-09-11 | Discharge: 2014-09-11 | Disposition: A | Discharge status: Home / Self Care | Payer: Medicare Other | Source: Ambulatory Visit | Attending: Internal Medicine | Admitting: Internal Medicine

## 2014-09-11 VITALS — BP 190/90 | HR 103 | Wt 174.2 lb

## 2014-09-11 DIAGNOSIS — R072 Precordial pain: Secondary | ICD-10-CM

## 2014-09-11 DIAGNOSIS — I279 Pulmonary heart disease, unspecified: Secondary | ICD-10-CM

## 2014-09-11 DIAGNOSIS — N186 End stage renal disease: Secondary | ICD-10-CM | POA: Diagnosis not present

## 2014-09-11 DIAGNOSIS — I10 Essential (primary) hypertension: Secondary | ICD-10-CM | POA: Insufficient documentation

## 2014-09-11 DIAGNOSIS — K766 Portal hypertension: Secondary | ICD-10-CM

## 2014-09-11 DIAGNOSIS — Z992 Dependence on renal dialysis: Secondary | ICD-10-CM

## 2014-09-11 DIAGNOSIS — R188 Other ascites: Secondary | ICD-10-CM | POA: Insufficient documentation

## 2014-09-11 DIAGNOSIS — I2721 Secondary pulmonary arterial hypertension: Secondary | ICD-10-CM

## 2014-09-11 MED ORDER — AMLODIPINE BESYLATE 10 MG PO TABS: 10.0000 mg | ORAL_TABLET | Freq: Every day | ORAL | Status: DC

## 2014-09-11 NOTE — Patient Instructions (Signed)
Start Amlodipine 10 mg daily  Paracentesis every 2 weeks, a standing order is in the system  RUQ abdominal ultrasound  Heart Cath on 09/20/14, see instruction sheet  Your physician recommends that you schedule a follow-up appointment in: 1 month

## 2014-09-11 NOTE — Progress Notes (Signed)
Patient ID: Holly Mendoza, female   DOB: 1971/07/01, 43 y.o.   MRN: [id number] Referring Physician: Dr. Arrie Aran Primary Care: N/A Primary Cardiologist: N/A Pulmonologist: Dr. Delton Coombes  HPI: Ms. Duquaine is a 43 yo female with a history ESRD on HD s/p failed kidney tx 2012, HTN, ascites (was going for weekly paracentesis in [place]). She was referred for pulmonary HTN.  Admitted to [facility] with chest pain. Enzymes negative. Had VQ with low probability of PE. Discharge weight was 173 pounds.   She returns for post hospital follow up. Has chest pain during dialysis. Mild dyspnea with exertion. Denies PND/Orthopnea. Not walking much says she sits most of the day. Taking all medications. Lives with Aunt and Uncle.   Paracentesis 08/15/14 Yielded 7 liters.   TTE 05/01/14: w moderate LVH, MV thickening and prolapse, estimated peak PASP  ECHO (04/2014): EF 65-70%, grade I DD, RV nl, mod TR  Labs 09/01/14 K 5.8 Creatinine 10.3    Past Medical History  Diagnosis Date  . 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 Hypertension   . ESRD on hemodialysis 04/20/2014    Patient started HD in 1998.  She has been dialyzed in [place] at "[facility]" until moving to [place] in July 2015 to live with family.  She has had failed accesses in the L arm.  No attempt to place access was made in the R arm, patient is not sure why.  She has a L thigh AVG which she says has been functional for 7-8 years.  They stopped giving her heparin several years ago due to prolonged access bleeding.    Marland Kitchen Heart murmur   . Anemia of chronic disease   . History of blood transfusion     "several; w/transplant, infections, etc"  . Headache(784.0)     "often; sometimes daily" (04/26/2014)  . Migraines     "qod" (04/26/2014)  . ESRD on hemodialysis 04/20/2014    Patient started HD in 1998.  She has been dialyzed in [place] at "[facility]" until moving to [place] in July 2015 to live with family.   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  She has had failed accesses in the L arm.  No attempt to place access was made in the R arm, patient is not sure why.  She has a L thigh AVG which she says has been functional for  . Ascites     Current Outpatient Prescriptions  Medication Sig Dispense Refill  . aspirin EC 81 MG tablet Take 1 tablet (81 mg total) by mouth daily. 30 tablet 2  . calcium acetate (PHOSLO) 667 MG capsule Take 1,334 mg by mouth 3 (three) times daily with meals.    . cloNIDine (CATAPRES) 0.3 MG tablet Take 0.3 mg by mouth 2 (two) times daily.    Marland Kitchen esomeprazole (NEXIUM) 40 MG capsule Take 1 capsule (40 mg total) by mouth daily at 12 noon. 30 capsule 2  . isosorbide mononitrate (IMDUR) 30 MG 24 hr tablet Take 1 tablet (30 mg total) by mouth daily. 30 tablet 2  . Skin Protectants, Misc. (EUCERIN) cream Apply 1 application topically daily as needed for dry skin.      No current facility-administered medications for this encounter.    Allergies  Allergen Reactions  . Contrast Media [Iodinated Diagnostic Agents] Anaphylaxis  . Procardia [Nifedipine] Other (See Comments)    Sweating, BP drops dangerously low,  .  Vancomycin Anaphylaxis and Hives      History   Social History  . Marital Status: Single    Spouse Name: N/A    Number of Children: N/A  . Years of Education: N/A   Occupational History  . Umemployed    Social History Main Topics  . Smoking status: Never Smoker   . Smokeless tobacco: Never Used  . Alcohol Use: No  . Drug Use: No  . Sexual Activity: No   Other Topics Concern  . Not on file   Social History Narrative   [place]. [place] in [place].       Family History  Problem Relation Age of Onset  . Healthy Mother     Ceasar MonsFiled Vitals:   09/11/14 1604  BP: 190/90  Pulse: 103  Weight: 174 lb 2.6 oz (79 kg)  SpO2: 96%    PHYSICAL EXAM: General:  Well appearing. No  respiratory difficulty. Ambulated slowly in the clinic. HEENT: normal Neck: supple. JVD ~10. Carotids 2+ bilat; no bruits. No lymphadenopathy or thryomegaly appreciated. Cor: PMI nondisplaced. Regular rate & rhythm. No rubs or gallops; 3/6 SEM TR Lungs: clear Abdomen: soft, nontender, +++ distended. No hepatosplenomegaly. No bruits or masses. Good bowel sounds. Extremities: no cyanosis, clubbing, rash, woody edema.  L thigh horseshoe graft Neuro: alert & oriented x 3, cranial nerves grossly intact. moves all 4 extremities w/o difficulty. Affect pleasant.   ASSESSMENT & PLAN:  1) ESRD: s/p failed kidnedy tx 2012; She goes to HD on M/W/F and dry weight is 80 kg. Had HD today tolerating about 2 hours.  2) HTN - Elevated. Will continue clonidine 0.3 mg twice a day. Start 10 mg norvasc daily  3) Ascites - She has marked volume overload and abdominal distention.  Will set up for paracentesis. Check ultrasound of RUQ to check for cirrhosis. Consider referral to GI.  4) PHTN on echo with R-sided HF and volume overload - Likely multifactorial. VQ scan ok.  - Will proceed with RHC for further evaluation.  5) Chest pain  - Given CRFs will proceed with LHC at time of RHC  Follow up in 4 weeks.   CLEGG,AMY NP-C 4:04 PM   Patient seen and examined with Tonye BecketAmy Clegg, NP. We discussed all aspects of the encounter. I agree with the assessment and plan as stated above.   She continues with severe volume overload and evidence of R>>L heart failure with marked ascites. 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 Will proceed with RHC. Given recent CP and CRFs will also proceed with coronary angiography at the same time. HTN severely elevated. Will add amlodipine 10 daily.  I discussed case with Dr. Arrie Aranoladonato by phone and he informed me that [name] frequently asks to come off HD early and never permits full volume removal preferring to have  paracenteses on frequent basis. I told her that I am worried that the persistent volume overload may be leading to cardiac cirrhosis and RV failure.   Total time spent 45 minutes. Over half that time spent discussing above.   Quantrell Splitt,MD 5:05 PM

## 2014-09-12 ENCOUNTER — Other Ambulatory Visit: Payer: Self-pay | Admitting: *Deleted

## 2014-09-17 ENCOUNTER — Other Ambulatory Visit (HOSPITAL_COMMUNITY): Payer: Self-pay | Admitting: Internal Medicine

## 2014-09-17 DIAGNOSIS — R188 Other ascites: Secondary | ICD-10-CM

## 2014-09-18 ENCOUNTER — Ambulatory Visit (HOSPITAL_COMMUNITY): Payer: Medicare Other

## 2014-09-18 ENCOUNTER — Ambulatory Visit (HOSPITAL_COMMUNITY)
Admission: RE | Admit: 2014-09-18 | Discharge: 2014-09-18 | Disposition: A | Discharge status: Home / Self Care | Payer: Medicare Other | Source: Ambulatory Visit | Attending: Internal Medicine | Admitting: Internal Medicine

## 2014-09-18 DIAGNOSIS — N261 Atrophy of kidney (terminal): Secondary | ICD-10-CM | POA: Insufficient documentation

## 2014-09-18 DIAGNOSIS — R748 Abnormal levels of other serum enzymes: Secondary | ICD-10-CM | POA: Insufficient documentation

## 2014-09-18 DIAGNOSIS — I272 Other secondary pulmonary hypertension: Secondary | ICD-10-CM | POA: Insufficient documentation

## 2014-09-18 DIAGNOSIS — R7989 Other specified abnormal findings of blood chemistry: Secondary | ICD-10-CM | POA: Insufficient documentation

## 2014-09-18 DIAGNOSIS — R188 Other ascites: Secondary | ICD-10-CM | POA: Diagnosis not present

## 2014-09-18 DIAGNOSIS — K766 Portal hypertension: Secondary | ICD-10-CM | POA: Insufficient documentation

## 2014-09-18 DIAGNOSIS — N186 End stage renal disease: Secondary | ICD-10-CM | POA: Insufficient documentation

## 2014-09-18 DIAGNOSIS — N281 Cyst of kidney, acquired: Secondary | ICD-10-CM | POA: Insufficient documentation

## 2014-09-18 NOTE — Procedures (Signed)
US guided therapeutic paracentesis performed yielding 6 liters yellow fluid. No immediate complications.  

## 2014-09-19 ENCOUNTER — Encounter (HOSPITAL_COMMUNITY): Payer: Self-pay | Admitting: Pharmacy Technician

## 2014-09-20 ENCOUNTER — Ambulatory Visit (HOSPITAL_COMMUNITY): Admission: RE | Admit: 2014-09-20 | Payer: Medicare Other | Source: Ambulatory Visit | Admitting: Internal Medicine

## 2014-09-20 ENCOUNTER — Encounter (HOSPITAL_COMMUNITY): Admission: RE | Payer: Self-pay | Source: Ambulatory Visit

## 2014-09-20 SURGERY — LEFT AND RIGHT HEART CATHETERIZATION WITH CORONARY ANGIOGRAM
Anesthesia: LOCAL

## 2014-10-05 ENCOUNTER — Ambulatory Visit (HOSPITAL_COMMUNITY)
Admission: RE | Admit: 2014-10-05 | Discharge: 2014-10-05 | Disposition: A | Discharge status: Home / Self Care | Payer: Medicare Other | Source: Ambulatory Visit | Attending: Internal Medicine | Admitting: Internal Medicine

## 2014-10-05 DIAGNOSIS — N186 End stage renal disease: Secondary | ICD-10-CM | POA: Insufficient documentation

## 2014-10-05 DIAGNOSIS — K766 Portal hypertension: Secondary | ICD-10-CM | POA: Diagnosis not present

## 2014-10-05 DIAGNOSIS — I272 Other secondary pulmonary hypertension: Secondary | ICD-10-CM | POA: Diagnosis not present

## 2014-10-05 DIAGNOSIS — R188 Other ascites: Secondary | ICD-10-CM | POA: Insufficient documentation

## 2014-10-05 NOTE — Procedures (Signed)
US guided therapeutic paracentesis performed yielding 5.6 liters light yellow fluid. No immediate complications.

## 2014-10-16 ENCOUNTER — Ambulatory Visit (HOSPITAL_COMMUNITY)
Admission: RE | Admit: 2014-10-16 | Discharge: 2014-10-16 | Disposition: A | Discharge status: Home / Self Care | Payer: Medicare Other | Source: Ambulatory Visit | Attending: Cardiology | Admitting: Cardiology

## 2014-10-16 ENCOUNTER — Encounter (HOSPITAL_COMMUNITY): Payer: Self-pay

## 2014-10-16 VITALS — BP 138/86 | HR 96 | Wt 178.0 lb

## 2014-10-16 DIAGNOSIS — Z79899 Other long term (current) drug therapy: Secondary | ICD-10-CM | POA: Insufficient documentation

## 2014-10-16 DIAGNOSIS — T8612 Kidney transplant failure: Secondary | ICD-10-CM | POA: Diagnosis not present

## 2014-10-16 DIAGNOSIS — I509 Heart failure, unspecified: Secondary | ICD-10-CM | POA: Insufficient documentation

## 2014-10-16 DIAGNOSIS — I272 Other secondary pulmonary hypertension: Secondary | ICD-10-CM | POA: Insufficient documentation

## 2014-10-16 DIAGNOSIS — N186 End stage renal disease: Secondary | ICD-10-CM | POA: Diagnosis not present

## 2014-10-16 DIAGNOSIS — I279 Pulmonary heart disease, unspecified: Secondary | ICD-10-CM

## 2014-10-16 DIAGNOSIS — Z992 Dependence on renal dialysis: Secondary | ICD-10-CM | POA: Diagnosis not present

## 2014-10-16 DIAGNOSIS — R188 Other ascites: Secondary | ICD-10-CM | POA: Insufficient documentation

## 2014-10-16 DIAGNOSIS — I120 Hypertensive chronic kidney disease with stage 5 chronic kidney disease or end stage renal disease: Secondary | ICD-10-CM | POA: Diagnosis not present

## 2014-10-16 DIAGNOSIS — I2721 Secondary pulmonary arterial hypertension: Secondary | ICD-10-CM

## 2014-10-16 DIAGNOSIS — K7460 Unspecified cirrhosis of liver: Secondary | ICD-10-CM | POA: Insufficient documentation

## 2014-10-16 DIAGNOSIS — K766 Portal hypertension: Secondary | ICD-10-CM

## 2014-10-16 DIAGNOSIS — Z7982 Long term (current) use of aspirin: Secondary | ICD-10-CM | POA: Diagnosis not present

## 2014-10-16 NOTE — Patient Instructions (Signed)
We have scheduled you for a heart catheterization on Friday, January 8th, 2016 at 11:30 am. SEE INSTRUCTION SHEET FOR FURTHER DETAILS.  We will refer you to GI specialist Dr. Erick Blinks.  Follow up 3 weeks with our office.  Do the following things EVERYDAY: 1) Weigh yourself in the morning before breakfast. Write it down and keep it in a log. 2) Take your medicines as prescribed 3) Eat low salt foods-Limit salt (sodium) to 2000 mg per day.  4) Stay as active as you can everyday 5) Limit all fluids for the day to less than 2 liters

## 2014-10-17 ENCOUNTER — Encounter: Payer: Self-pay | Admitting: Internal Medicine

## 2014-10-17 NOTE — Progress Notes (Signed)
Patient ID: Holly Mendoza, female   DOB: 1971-07-20, 43 y.o.   MRN: [id number] Referring Physician: Dr. Arrie Aran Primary Care: N/A Primary Cardiologist: N/A Pulmonologist: Dr. Delton Coombes  HPI: Ms. Widdison is a 43 yo female with a history ESRD on HD s/p failed kidney tx 2012, HTN, ascites (was going for weekly paracentesis in [place]). She was referred for pulmonary HTN.  Admitted to [facility] with chest pain. Enzymes negative. Had VQ with low probability of PE. Discharge weight was 173 pounds.   She has had chest pain during dialysis. Mild dyspnea with exertion. Denies PND/Orthopnea. Not walking much says she sits most of the day. Taking all medications. Lives with Aunt and Uncle.   She has frequent therapeutic paracenteses.  She also will sign off early from dialysis due to symptoms such as nausea.  Abdominal US in 12/15 was concerning for possible cirrhosis. Weight is up 4 lbs since last appointment.   ECHO (04/2014): EF 65-70%, moderate LVH, grade I DD, RV nl, mod TR, PASP 45 mmHg.   Labs 09/01/14 K 5.8 Creatinine 10.3    Past Medical History  Diagnosis Date  . 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 Hypertension   . ESRD on hemodialysis 04/20/2014    Patient started HD in 1998.  She has been dialyzed in [place] at "[facility]" until moving to [place] in July 2015 to live with family.  She has had failed accesses in the L arm.  No attempt to place access was made in the R arm, patient is not sure why.  She has a L thigh AVG which she says has been functional for 7-8 years.  They stopped giving her heparin several years ago due to prolonged access bleeding.    Marland Kitchen Heart murmur   . Anemia of chronic disease   . History of blood transfusion     "several; w/transplant, infections, etc"  . Headache(784.0)     "often; sometimes daily" (04/26/2014)  . Migraines     "qod" (04/26/2014)  . ESRD on hemodialysis 04/20/2014    Patient started HD in 1998.  She has been dialyzed  in [place] at "[facility]" until moving to [place] in July 2015 to live with family.  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  She has had failed accesses in the L arm.  No attempt to place access was made in the R arm, patient is not sure why.  She has a L thigh AVG which she says has been functional for  . Ascites     Current Outpatient Prescriptions  Medication Sig Dispense Refill  . amLODipine (NORVASC) 10 MG tablet Take 1 tablet (10 mg total) by mouth daily. 30 tablet 3  . aspirin EC 81 MG tablet Take 1 tablet (81 mg total) by mouth daily. 30 tablet 2  . calcium acetate (PHOSLO) 667 MG capsule Take 1,334 mg by mouth 3 (three) times daily with meals.    . cloNIDine (CATAPRES) 0.3 MG tablet Take 0.3 mg by mouth 2 (two) times daily.    Marland Kitchen esomeprazole (NEXIUM) 40 MG capsule Take 1 capsule (40 mg total) by mouth daily at 12 noon. 30 capsule 2  . Skin Protectants, Misc. (EUCERIN) cream Apply 1 application topically daily as needed for dry skin.      No current facility-administered medications for this encounter.    Allergies  Allergen Reactions  . Contrast Media [Iodinated Diagnostic  Agents] Anaphylaxis  . Procardia [Nifedipine] Other (See Comments)    Sweating, BP drops dangerously low,  . Vancomycin Anaphylaxis and Hives      History   Social History  . Marital Status: Single    Spouse Name: N/A    Number of Children: N/A  . Years of Education: N/A   Occupational History  . Umemployed    Social History Main Topics  . Smoking status: Never Smoker   . Smokeless tobacco: Never Used  . Alcohol Use: No  . Drug Use: No  . Sexual Activity: No   Other Topics Concern  . Not on file   Social History Narrative   [place]. [place] from [place] in July [place].       Family History  Problem Relation Age of Onset  . Healthy Mother     Ceasar MonsFiled Vitals:   10/16/14 1558  BP: 138/86  Pulse: 96  Weight: 178 lb  (80.74 kg)  SpO2: 97%    PHYSICAL EXAM: General:  Well appearing. No respiratory difficulty. Ambulated slowly in the clinic. HEENT: normal Neck: supple. Dilated EJs, IJ pulsation difficult to localize. Carotids 2+ bilat; no bruits. No lymphadenopathy or thryomegaly appreciated. Cor: PMI nondisplaced. Regular rate & rhythm. No rubs or gallops; 2/6 HSM LLSB. Widely split S2.  Lungs: clear Abdomen: soft, nontender, mild to moderately distended. No hepatosplenomegaly. No bruits or masses. Good bowel sounds. Extremities: no cyanosis, clubbing, rash.  1+ edema 1/3 to knees bilaterally.  Neuro: alert & oriented x 3, cranial nerves grossly intact. moves all 4 extremities w/o difficulty. Affect pleasant.   ASSESSMENT & PLAN:  1) ESRD: s/p failed kidney tx 2012; She goes to HD on M/W/F.  She is still signing off dialysis early.  I do think that this is contributing to volume overload/ascites.  I encouraged her to work with her nephrologist to find a way to get more fluid off at HD.  2) HTN: BP in normal range on clonidine + amlodipine. 3) Ascites: Recurrent ascites s/p multiple paracenteses.  As above, she is encouraged to try to stay longer at HD to get more fluid off.   4) Pulmonary HTN on echo with R-sided HF and volume overload:  Evidence of R>>L heart failure with marked ascites. Echo shows mild to moderate pulmonary HTN.  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 Will proceed with RHC. Given recent CP and risk factors will also proceed with coronary angiography at the same time.  She was supposed to have cath earlier this month but had diarrhea and cancelled. I will reschedule for next week.  5) Chest pain: Given CRFs will proceed with LHC at time of RHC 6) Cirrhosis: See above. 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  I will refer to GI.   We need to make sure she has had HCV checked, suspect it has been done by her nephrologist, she will ask.   Marca AnconaDalton Josaiah Muhammed 10/17/2014

## 2014-10-18 ENCOUNTER — Telehealth (HOSPITAL_COMMUNITY): Payer: Self-pay | Admitting: Cardiology

## 2014-10-18 NOTE — Telephone Encounter (Signed)
Pt scheduled for R/L Heart Cath on 10/26/14 with Dr. Gala Romney With pts current insurance- medicare/medicaid-[employer]

## 2014-10-25 ENCOUNTER — Ambulatory Visit (HOSPITAL_COMMUNITY): Payer: Medicare Other

## 2014-10-25 ENCOUNTER — Telehealth (HOSPITAL_COMMUNITY): Payer: Self-pay | Admitting: Vascular Surgery

## 2014-10-25 NOTE — Telephone Encounter (Signed)
Pt called she has a cath scheduled tomorrow she is having transportation issues and she wants to talk to [facility] about her cath in general

## 2014-10-26 ENCOUNTER — Ambulatory Visit (HOSPITAL_COMMUNITY)
Admission: RE | Admit: 2014-10-26 | Discharge: 2014-10-26 | Disposition: A | Discharge status: Home / Self Care | Payer: Medicare Other | Source: Ambulatory Visit | Attending: Internal Medicine | Admitting: Internal Medicine

## 2014-10-26 ENCOUNTER — Ambulatory Visit (HOSPITAL_COMMUNITY): Admission: RE | Admit: 2014-10-26 | Payer: Medicare Other | Source: Ambulatory Visit | Admitting: Internal Medicine

## 2014-10-26 ENCOUNTER — Encounter (HOSPITAL_COMMUNITY): Admission: RE | Payer: Self-pay | Source: Ambulatory Visit

## 2014-10-26 DIAGNOSIS — R188 Other ascites: Secondary | ICD-10-CM | POA: Diagnosis present

## 2014-10-26 SURGERY — LEFT AND RIGHT HEART CATHETERIZATION WITH CORONARY ANGIOGRAM
Anesthesia: LOCAL

## 2014-10-26 MED ORDER — LIDOCAINE HCL (PF) 1 % IJ SOLN
INTRAMUSCULAR | Status: AC
  Filled 2014-10-26: qty 10

## 2014-10-26 NOTE — Telephone Encounter (Signed)
Dr. Gala Romney made aware.  Tried to return patient's phone call to assess concerns and transportation issue, no answer.  Ave Filter

## 2014-11-06 ENCOUNTER — Encounter (HOSPITAL_COMMUNITY): Payer: Medicare Other

## 2014-11-12 ENCOUNTER — Other Ambulatory Visit (HOSPITAL_COMMUNITY): Payer: Self-pay | Admitting: Internal Medicine

## 2014-11-12 DIAGNOSIS — R188 Other ascites: Secondary | ICD-10-CM

## 2014-11-15 ENCOUNTER — Ambulatory Visit (HOSPITAL_COMMUNITY)
Admission: RE | Admit: 2014-11-15 | Discharge: 2014-11-15 | Disposition: A | Discharge status: Home / Self Care | Payer: Medicare Other | Source: Ambulatory Visit | Attending: Internal Medicine | Admitting: Internal Medicine

## 2014-11-15 DIAGNOSIS — N186 End stage renal disease: Secondary | ICD-10-CM | POA: Diagnosis not present

## 2014-11-15 DIAGNOSIS — K766 Portal hypertension: Secondary | ICD-10-CM | POA: Insufficient documentation

## 2014-11-15 DIAGNOSIS — I272 Other secondary pulmonary hypertension: Secondary | ICD-10-CM | POA: Insufficient documentation

## 2014-11-15 DIAGNOSIS — R188 Other ascites: Secondary | ICD-10-CM

## 2014-11-15 NOTE — Procedures (Signed)
US guided therapeutic paracentesis performed yielding 7  liters yellow fluid. No immediate complications.  

## 2014-11-21 ENCOUNTER — Encounter (HOSPITAL_COMMUNITY): Payer: Medicare Other

## 2014-11-27 ENCOUNTER — Encounter: Payer: Self-pay | Admitting: *Deleted

## 2014-12-04 ENCOUNTER — Other Ambulatory Visit (HOSPITAL_COMMUNITY): Payer: Self-pay | Admitting: Cardiology

## 2014-12-04 ENCOUNTER — Other Ambulatory Visit (HOSPITAL_COMMUNITY): Payer: Self-pay | Admitting: Internal Medicine

## 2014-12-04 ENCOUNTER — Ambulatory Visit (HOSPITAL_COMMUNITY): Admission: RE | Admit: 2014-12-04 | Payer: Medicare Other | Source: Ambulatory Visit

## 2014-12-04 DIAGNOSIS — R188 Other ascites: Secondary | ICD-10-CM

## 2014-12-05 ENCOUNTER — Ambulatory Visit (HOSPITAL_COMMUNITY)
Admission: RE | Admit: 2014-12-05 | Discharge: 2014-12-05 | Disposition: A | Discharge status: Home / Self Care | Payer: Medicare Other | Source: Ambulatory Visit | Attending: Internal Medicine | Admitting: Internal Medicine

## 2014-12-05 ENCOUNTER — Other Ambulatory Visit (HOSPITAL_COMMUNITY): Payer: Medicare Other

## 2014-12-05 ENCOUNTER — Ambulatory Visit (HOSPITAL_COMMUNITY): Payer: Medicare Other

## 2014-12-05 DIAGNOSIS — R188 Other ascites: Secondary | ICD-10-CM | POA: Diagnosis present

## 2014-12-05 DIAGNOSIS — I272 Other secondary pulmonary hypertension: Secondary | ICD-10-CM | POA: Diagnosis not present

## 2014-12-05 DIAGNOSIS — K766 Portal hypertension: Secondary | ICD-10-CM | POA: Diagnosis not present

## 2014-12-05 DIAGNOSIS — N186 End stage renal disease: Secondary | ICD-10-CM | POA: Insufficient documentation

## 2014-12-05 NOTE — Procedures (Signed)
US guided therapeutic paracentesis performed yielding 7.7 liters yellow fluid. No immediate complications.

## 2014-12-11 ENCOUNTER — Ambulatory Visit (INDEPENDENT_AMBULATORY_CARE_PROVIDER_SITE_OTHER): Payer: Medicare Other | Admitting: Internal Medicine

## 2014-12-11 ENCOUNTER — Telehealth: Payer: Self-pay | Admitting: *Deleted

## 2014-12-11 ENCOUNTER — Encounter: Payer: Self-pay | Admitting: Internal Medicine

## 2014-12-11 VITALS — BP 132/70 | HR 112 | Ht 68.0 in | Wt 180.0 lb

## 2014-12-11 DIAGNOSIS — Z992 Dependence on renal dialysis: Secondary | ICD-10-CM

## 2014-12-11 DIAGNOSIS — R932 Abnormal findings on diagnostic imaging of liver and biliary tract: Secondary | ICD-10-CM

## 2014-12-11 DIAGNOSIS — K7460 Unspecified cirrhosis of liver: Secondary | ICD-10-CM

## 2014-12-11 DIAGNOSIS — N186 End stage renal disease: Secondary | ICD-10-CM

## 2014-12-11 DIAGNOSIS — I159 Secondary hypertension, unspecified: Secondary | ICD-10-CM

## 2014-12-11 DIAGNOSIS — R188 Other ascites: Secondary | ICD-10-CM

## 2014-12-11 NOTE — Patient Instructions (Signed)
You have been scheduled for an endoscopy. Please follow written instructions given to you at your visit today. If you use inhalers (even only as needed), please bring them with you on the day of your procedure. Your physician has requested that you go to www.startemmi.com and enter the access code given to you at your visit today. This web site gives a general overview about your procedure. However, you should still follow specific instructions given to you by our office regarding your preparation for the procedure.  Your physician has requested that you go to the basement for the following lab work before leaving today: ANA, IgG, CBC, CMP, INR  You have been scheduled for an abdominal ultrasound elastography with doppler at [facility] Radiology (1st floor of hospital) on ________ at _________. Please arrive 15 minutes prior to your appointment for registration. Make certain not to have anything to eat or drink 6 hours prior to your appointment. Should you need to reschedule your appointment, please contact radiology at [phone]. This test typically takes about 30 minutes to perform.  CC:Dr Bensihmon

## 2014-12-11 NOTE — Progress Notes (Signed)
Patient ID: Holly Mendoza, female   DOB: 1970-12-31, 44 y.o.   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 She is here alone today. She reports that for several years she has needed frequent paracentesis for removal of large volume ascites. This is currently occurring every other week but she feels that she needs it weekly. When the ascites builds up she has abdominal pressure, dyspnea on exertion and also constipation. After paracentesis she often has loose stools but improvement in dyspnea on exertion and early satiety. She reports that she is currently eating okay but she does notice nausea and occasional vomiting when coming off of the dialysis machine. She does note occasional hypotension after dialysis. Her nausea can be present but is much reduced on non-dialysis days. She does have issues with lower extremity edema. She denies dysphagia or odynophagia. Regarding her liver, she was told that she possibly has cirrhosis. No family history other than an uncle with a history of alcoholism who also had liver disease. She denies history of alcohol, tobacco or IV drug use. She did try marijuana when she was 22 but did not like it. She denies a history of jaundice or confusion. No history of GI bleeding.  On review of records her hepatitis serologies have been negative. She had a SAAG of 1.1 earlier this year   Past Medical History  Diagnosis Date  . 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 Hypertension   . ESRD on hemodialysis 04/20/2014    Patient started HD in 1998.  She has been dialyzed  in [place] at "[facility]" until moving to [place] in July 2015 to live with family.  She has had failed accesses in the L arm.  No attempt to place access was made in the R arm, patient is not sure why.  She has a L thigh AVG which she says has been functional for 7-8 years.  They stopped giving her heparin several years ago due to prolonged access bleeding.    Marland Kitchen Heart murmur   . Anemia of chronic disease   . History of blood transfusion     "several; w/transplant, infections, etc"  . Headache(784.0)     "often; sometimes daily" (04/26/2014)  . Migraines     "qod" (04/26/2014)  . ESRD on hemodialysis 04/20/2014    Patient started HD in 1998.  She has been dialyzed in [place] at "[facility]" until moving to [place] in July 2015 to live with family.  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  She has had failed accesses in the L arm.  No attempt to place access was made in the R arm, patient is not sure why.  She has a L thigh AVG which she says has been functional for  . Ascites   . Pulmonary hypertension     Past Surgical History  Procedure Laterality Date  . Arteriovenous graft placement Left 2013    thigh  . 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 Nephrectomy transplanted organ  2013  Outpatient Prescriptions Prior to Visit  Medication Sig Dispense Refill  . amLODipine (NORVASC) 10 MG tablet Take 1 tablet (10 mg total) by mouth daily. 30 tablet 3  . aspirin EC 81 MG tablet Take 1 tablet (81 mg total) by mouth daily. 30 tablet 2  . calcium acetate (PHOSLO) 667 MG capsule Take 667 mg by mouth 3 (three) times daily with meals.     . cloNIDine (CATAPRES) 0.3 MG tablet Take 0.3 mg by mouth 2 (two) times daily.    Marland Kitchen esomeprazole (NEXIUM) 40 MG capsule Take 1 capsule (40 mg total) by mouth daily at 12 noon. 30 capsule 2  . Skin Protectants, Misc. (EUCERIN) cream Apply 1 application topically daily  as needed for dry skin.      No facility-administered medications prior to visit.    Allergies  Allergen Reactions  . Contrast Media [Iodinated Diagnostic Agents] Anaphylaxis  . Procardia [Nifedipine] Other (See Comments)    Sweating, BP drops dangerously low,  . Vancomycin Anaphylaxis and Hives    Family History  Problem Relation Age of Onset  . Healthy Mother   . Liver disease Maternal Uncle     drinker  . Asthma Maternal Grandmother     History  Substance Use Topics  . Smoking status: Never Smoker   . Smokeless tobacco: Never Used  . Alcohol Use: No    ROS: As per history of present illness, otherwise negative  BP 132/70 mmHg  Pulse 112  Ht 5\' 8"  (1.727 m)  Wt 180 lb (81.647 kg)  BMI 27.38 kg/m2 Constitutional: Well-developed and chronically ill-appearing female. No distress. HEENT: Normocephalic and atraumatic. Oropharynx is clear and moist. No oropharyngeal exudate. Conjunctivae are normal.  No scleral icterus. Neck: Neck supple. Trachea midline. Cardiovascular: Normal rate, regular rhythm and intact distal pulses. 2/6 sem Pulmonary/chest: Effort normal and breath sounds normal. No wheezing, rales or rhonchi. Abdominal: Soft, moderately distended with ascites, nontender, reducible umbilical hernia, bowel sounds active throughout.  Extremities: no clubbing, cyanosis,1+ LE edema Lymphadenopathy: No cervical adenopathy noted. Neurological: Alert and oriented to person place and time. No asterixis Skin: Skin is warm and dry. No rashes noted. Psychiatric: Normal mood and affect. Behavior is normal.  RELEVANT LABS AND IMAGING: CBC    Component Value Date/Time   WBC 5.6 09/01/2014 1658   RBC 3.16* 09/01/2014 1658   HGB 9.2* 09/01/2014 1658   HCT 28.1* 09/01/2014 1658   PLT 183 09/01/2014 1658   MCV 88.9 09/01/2014 1658   MCH 29.1 09/01/2014 1658   MCHC 32.7 09/01/2014 1658   RDW 14.9 09/01/2014 1658   LYMPHSABS 0.7 05/23/2014 0830   MONOABS 0.7 05/23/2014  0830   EOSABS 0.2 05/23/2014 0830   BASOSABS 0.0 05/23/2014 0830    CMP     Component Value Date/Time   NA 137 09/01/2014 1657   K 5.8* 09/01/2014 1657   CL 96 09/01/2014 1657   CO2 23 09/01/2014 1657   GLUCOSE 122* 09/01/2014 1657   BUN 68* 09/01/2014 1657   CREATININE 10.31* 09/01/2014 1657   CALCIUM 8.3* 09/01/2014 1657   PROT 5.7* 09/01/2014 1202   ALBUMIN 2.0* 09/01/2014 1657   AST 13 09/01/2014 1202   ALT 13 09/01/2014 1202   ALKPHOS 127* 09/01/2014 1202   BILITOT 0.2* 09/01/2014 1202   GFRNONAA 4* 09/01/2014 1657   GFRAA 5* 09/01/2014 1657   EXAM: ULTRASOUND ABDOMEN COMPLETE   COMPARISON:  Ultrasound 08/15/2014.   FINDINGS: Gallbladder: No gallstones. Gallbladder wall thickness 3 point of  mm. No sonographic Murphy sign noted.   Common bile duct: Diameter: 4.6 mm   Liver: Liver echotexture is heterogeneous and echogenic. Liver is irregular, nodularity may be present. Cirrhosis cannot be excluded.   IVC: No abnormality visualized.   Pancreas: Visualized portion unremarkable.   Spleen: Size and appearance within normal limits.   Right Kidney: Length: 7.3 cm. Right kidney is extremely echogenic and atrophic. The sinus consistent chronic renal disease. Multiple simple cysts the largest measures 4.0 cm   Left Kidney: Length: 6.9 cm. Left kidney is extremely echogenic and atrophic consistent with chronic renal disease. Multiple simple cysts, the largest measures 1.6 cm.   Abdominal aorta: No aneurysm visualized.   Other findings: Ascites, moderate.   IMPRESSION: 1. Liver echotexture is heterogeneous and echogenic. Liver is irregular, nodularity may be present. These findings suggest the possibility of cirrhosis. 2. Bilateral renal atrophy.  Bilateral renal simple cyst. 3. Moderate ascites.     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  1. ? 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 Interestingly her bilirubin is normal as are her platelets. Denies exclude portal pulmonary hypertension, but cardiac cirrhosis from congestion from CHF is certainly possible. Viral hepatitis is been excluded. She does not have a history of alcohol use nor fatty liver that we are aware of. Check ANA and IgG. I have recommended repeat liver enzymes and an INR. I've also recommended upper endoscopy to exclude esophageal varices. We discussed this test including the risks and benefits and she is agreeable to proceed. SAAG was 1.1, which is right on the line for predicting portal HTN, so isn't quite as helpful.  I have recommended repeating ultrasound with elastography and doppler to evaluate portal blood flow.  She is receiving large volume paracentesis every 1-2 weeks. She may be a candidate for TIPS procedure, but first want to workup the possibility of underlying liver cirrhosis more fully. She understands this approach.  2. ESRD -- follows closely with nephrology and dialysis on Monday, Wednesday and Friday  3. Hypertension -- well controlled with amlodipine and clonidine currently  followup in 3 months, sooner if necessary    ZO:XWRUEA R Bensimhon, Md [address] [place], [place]  [address]

## 2014-12-11 NOTE — Telephone Encounter (Signed)
Left message for patient to call back. She has been scheduled for ultrasound elastography with doppler to r/o portal vein thrombosis on 01/10/15 @ 9:00 am [facility]. Patient should be NPO 6 hours prior to the test.

## 2014-12-12 NOTE — Telephone Encounter (Signed)
Left message for patient to call back  

## 2014-12-12 NOTE — Telephone Encounter (Signed)
Patient also needs to come for labwork since she did not have it yesterday at her office visit.

## 2014-12-13 ENCOUNTER — Encounter: Payer: Medicare Other | Admitting: Internal Medicine

## 2014-12-13 NOTE — Telephone Encounter (Signed)
Left message for patient to call back  

## 2014-12-17 NOTE — Telephone Encounter (Signed)
I have spoken to patient to advise of time, date location and prep instructions for upcoming ultrasound elastography with doppler. I have also reiterated the importance of getting labwork we ordered at her last office visit since she did not get this done when she was here. She assures me that she will have these labs completed this week. She also verbalizes understanding of ultrasound instructions.

## 2014-12-19 ENCOUNTER — Observation Stay (HOSPITAL_COMMUNITY): Payer: Medicare Other

## 2014-12-19 ENCOUNTER — Ambulatory Visit (HOSPITAL_COMMUNITY)
Admission: RE | Admit: 2014-12-19 | Discharge: 2014-12-19 | Disposition: A | Discharge status: Home / Self Care | Payer: Medicare Other | Source: Ambulatory Visit | Attending: Internal Medicine | Admitting: Internal Medicine

## 2014-12-19 ENCOUNTER — Other Ambulatory Visit: Payer: Self-pay

## 2014-12-19 ENCOUNTER — Inpatient Hospital Stay (HOSPITAL_COMMUNITY)
Admission: EM | Admit: 2014-12-19 | Discharge: 2014-12-21 | DRG: 640 | Disposition: A | Discharge status: Home / Self Care | Payer: Medicare Other | Attending: Internal Medicine | Admitting: Internal Medicine

## 2014-12-19 ENCOUNTER — Encounter (HOSPITAL_COMMUNITY): Payer: Self-pay

## 2014-12-19 DIAGNOSIS — R112 Nausea with vomiting, unspecified: Secondary | ICD-10-CM

## 2014-12-19 DIAGNOSIS — Z7982 Long term (current) use of aspirin: Secondary | ICD-10-CM | POA: Diagnosis not present

## 2014-12-19 DIAGNOSIS — N186 End stage renal disease: Secondary | ICD-10-CM | POA: Diagnosis not present

## 2014-12-19 DIAGNOSIS — Z940 Kidney transplant status: Secondary | ICD-10-CM

## 2014-12-19 DIAGNOSIS — R188 Other ascites: Secondary | ICD-10-CM | POA: Insufficient documentation

## 2014-12-19 DIAGNOSIS — K7460 Unspecified cirrhosis of liver: Secondary | ICD-10-CM | POA: Diagnosis present

## 2014-12-19 DIAGNOSIS — Z9119 Patient's noncompliance with other medical treatment and regimen: Secondary | ICD-10-CM | POA: Diagnosis present

## 2014-12-19 DIAGNOSIS — R109 Unspecified abdominal pain: Secondary | ICD-10-CM

## 2014-12-19 DIAGNOSIS — K766 Portal hypertension: Secondary | ICD-10-CM

## 2014-12-19 DIAGNOSIS — I120 Hypertensive chronic kidney disease with stage 5 chronic kidney disease or end stage renal disease: Secondary | ICD-10-CM | POA: Diagnosis present

## 2014-12-19 DIAGNOSIS — N2581 Secondary hyperparathyroidism of renal origin: Secondary | ICD-10-CM | POA: Diagnosis present

## 2014-12-19 DIAGNOSIS — E119 Type 2 diabetes mellitus without complications: Secondary | ICD-10-CM | POA: Diagnosis present

## 2014-12-19 DIAGNOSIS — E875 Hyperkalemia: Principal | ICD-10-CM | POA: Diagnosis present

## 2014-12-19 DIAGNOSIS — Z992 Dependence on renal dialysis: Secondary | ICD-10-CM

## 2014-12-19 DIAGNOSIS — K652 Spontaneous bacterial peritonitis: Secondary | ICD-10-CM

## 2014-12-19 DIAGNOSIS — D638 Anemia in other chronic diseases classified elsewhere: Secondary | ICD-10-CM | POA: Diagnosis present

## 2014-12-19 DIAGNOSIS — I272 Other secondary pulmonary hypertension: Secondary | ICD-10-CM | POA: Insufficient documentation

## 2014-12-19 DIAGNOSIS — Z87898 Personal history of other specified conditions: Secondary | ICD-10-CM

## 2014-12-19 DIAGNOSIS — K5900 Constipation, unspecified: Secondary | ICD-10-CM | POA: Diagnosis present

## 2014-12-19 DIAGNOSIS — I10 Essential (primary) hypertension: Secondary | ICD-10-CM | POA: Diagnosis present

## 2014-12-19 DIAGNOSIS — R1084 Generalized abdominal pain: Secondary | ICD-10-CM | POA: Diagnosis not present

## 2014-12-19 DIAGNOSIS — R197 Diarrhea, unspecified: Secondary | ICD-10-CM

## 2014-12-19 DIAGNOSIS — Z79899 Other long term (current) drug therapy: Secondary | ICD-10-CM

## 2014-12-19 LAB — COMPREHENSIVE METABOLIC PANEL
ALT: 17 U/L (ref 0–35)
AST: 14 U/L (ref 0–37)
Albumin: 2.2 g/dL — ABNORMAL LOW (ref 3.5–5.2)
Alkaline Phosphatase: 95 U/L (ref 39–117)
Anion gap: 17 — ABNORMAL HIGH (ref 5–15)
BUN: 97 mg/dL — ABNORMAL HIGH (ref 6–23)
CHLORIDE: 99 mmol/L (ref 96–112)
CO2: 23 mmol/L (ref 19–32)
Calcium: 8.5 mg/dL (ref 8.4–10.5)
Creatinine, Ser: 13.34 mg/dL — ABNORMAL HIGH (ref 0.50–1.10)
GFR calc Af Amer: 3 mL/min — ABNORMAL LOW (ref >90)
GFR, EST NON AFRICAN AMERICAN: 3 mL/min — AB (ref >90)
Glucose, Bld: 87 mg/dL (ref 70–99)
POTASSIUM: 7.2 mmol/L — AB (ref 3.5–5.1)
SODIUM: 139 mmol/L (ref 135–145)
TOTAL PROTEIN: 5.5 g/dL — AB (ref 6.0–8.3)
Total Bilirubin: 0.5 mg/dL (ref 0.3–1.2)

## 2014-12-19 LAB — CBC WITH DIFFERENTIAL/PLATELET
Basophils Absolute: 0 10*3/uL (ref 0.0–0.1)
Basophils Relative: 0 % (ref 0–1)
EOS ABS: 0.1 10*3/uL (ref 0.0–0.7)
Eosinophils Relative: 1 % (ref 0–5)
HCT: 36.6 % (ref 36.0–46.0)
Hemoglobin: 11.4 g/dL — ABNORMAL LOW (ref 12.0–15.0)
Lymphocytes Relative: 10 % — ABNORMAL LOW (ref 12–46)
Lymphs Abs: 1 10*3/uL (ref 0.7–4.0)
MCH: 28.2 pg (ref 26.0–34.0)
MCHC: 31.1 g/dL (ref 30.0–36.0)
MCV: 90.6 fL (ref 78.0–100.0)
Monocytes Absolute: 0.5 10*3/uL (ref 0.1–1.0)
Monocytes Relative: 5 % (ref 3–12)
NEUTROS ABS: 8.5 10*3/uL — AB (ref 1.7–7.7)
Neutrophils Relative %: 84 % — ABNORMAL HIGH (ref 43–77)
PLATELETS: 207 10*3/uL (ref 150–400)
RBC: 4.04 MIL/uL (ref 3.87–5.11)
RDW: 14.6 % (ref 11.5–15.5)
WBC: 10.1 10*3/uL (ref 4.0–10.5)

## 2014-12-19 LAB — I-STAT CG4 LACTIC ACID, ED: Lactic Acid, Venous: 0.95 mmol/L (ref 0.5–2.0)

## 2014-12-19 LAB — AMMONIA: Ammonia: 25 umol/L (ref 11–32)

## 2014-12-19 MED ORDER — OXYCODONE-ACETAMINOPHEN 5-325 MG PO TABS
1.0000 | ORAL_TABLET | Freq: Once | ORAL | Status: DC
Start: 2014-12-19 — End: 2014-12-19
  Filled 2014-12-19: qty 1

## 2014-12-19 MED ORDER — ACETAMINOPHEN 325 MG PO TABS
650.0000 mg | ORAL_TABLET | Freq: Four times a day (QID) | ORAL | Status: DC | PRN
  Filled 2014-12-19: qty 2

## 2014-12-19 MED ORDER — SODIUM CHLORIDE 0.9 % IJ SOLN
3.0000 mL | Freq: Two times a day (BID) | INTRAMUSCULAR | Status: DC
  Administered 2014-12-20 – 2014-12-21 (×3): 3 mL via INTRAVENOUS

## 2014-12-19 MED ORDER — HYDROMORPHONE HCL 1 MG/ML IJ SOLN
0.5000 mg | Freq: Once | INTRAMUSCULAR | Status: AC
  Administered 2014-12-19: 0.5 mg via INTRAVENOUS
  Filled 2014-12-19: qty 1

## 2014-12-19 MED ORDER — SODIUM CHLORIDE 0.9 % IV SOLN
1.0000 g | Freq: Once | INTRAVENOUS | Status: AC
  Administered 2014-12-19: 1 g via INTRAVENOUS
  Filled 2014-12-19: qty 10

## 2014-12-19 MED ORDER — ONDANSETRON HCL 4 MG/2ML IJ SOLN: 4.0000 mg | Freq: Three times a day (TID) | INTRAMUSCULAR | Status: DC | PRN

## 2014-12-19 MED ORDER — CLONIDINE HCL 0.3 MG/24HR TD PTWK
0.3000 mg | MEDICATED_PATCH | TRANSDERMAL | Status: DC
  Filled 2014-12-19: qty 1

## 2014-12-19 MED ORDER — HYDRALAZINE HCL 20 MG/ML IJ SOLN: 10.0000 mg | INTRAMUSCULAR | Status: DC | PRN

## 2014-12-19 MED ORDER — HYDROMORPHONE HCL 1 MG/ML IJ SOLN
0.5000 mg | INTRAMUSCULAR | Status: DC | PRN
  Administered 2014-12-19: 0.5 mg via INTRAVENOUS
  Filled 2014-12-19: qty 1

## 2014-12-19 MED ORDER — MORPHINE SULFATE 4 MG/ML IJ SOLN
2.0000 mg | Freq: Once | INTRAMUSCULAR | Status: AC
  Administered 2014-12-19: 2 mg via INTRAVENOUS
  Filled 2014-12-19: qty 1

## 2014-12-19 MED ORDER — DEXTROSE 10 % IV SOLN: Freq: Once | INTRAVENOUS | Status: DC

## 2014-12-19 MED ORDER — CEFTRIAXONE SODIUM IN DEXTROSE 40 MG/ML IV SOLN
2.0000 g | INTRAVENOUS | Status: DC
  Administered 2014-12-20: 2 g via INTRAVENOUS
  Filled 2014-12-19 (×3): qty 50

## 2014-12-19 MED ORDER — ACETAMINOPHEN 650 MG RE SUPP: 650.0000 mg | Freq: Four times a day (QID) | RECTAL | Status: DC | PRN

## 2014-12-19 MED ORDER — ONDANSETRON HCL 4 MG/2ML IJ SOLN: 4.0000 mg | Freq: Four times a day (QID) | INTRAMUSCULAR | Status: DC | PRN

## 2014-12-19 MED ORDER — DEXTROSE 50 % IV SOLN
1.0000 | Freq: Once | INTRAVENOUS | Status: AC
  Administered 2014-12-19: 50 mL via INTRAVENOUS
  Filled 2014-12-19: qty 50

## 2014-12-19 MED ORDER — ONDANSETRON HCL 4 MG PO TABS: 4.0000 mg | ORAL_TABLET | Freq: Four times a day (QID) | ORAL | Status: DC | PRN

## 2014-12-19 MED ORDER — INSULIN ASPART 100 UNIT/ML IV SOLN
10.0000 [IU] | Freq: Once | INTRAVENOUS | Status: AC
  Administered 2014-12-19: 10 [IU] via INTRAVENOUS
  Filled 2014-12-19: qty 0.1

## 2014-12-19 NOTE — ED Provider Notes (Signed)
  Face-to-face evaluation   History: She presents for evaluation of right-sided abdominal pain. Following removal of 6 L fluid, via paracentesis, today. She gets regular dialysis and regular paracentesis, every 2 weeks. She states that she does not have known liver trouble and that her abdominal fluid, is related to her kidney disease. She denies fever, chills, nausea or vomiting.  Physical exam: Alert, calm, cooperative. Abdomen normal bowel sounds, soft, mild diffuse tenderness. No peritoneal signs concerning for peritonitis.  Medical screening examination/treatment/procedure(s) were conducted as a shared visit with non-physician practitioner(s) and myself.  I personally evaluated the patient during the encounter  Flint Melter, MD 12/20/14 [phone]

## 2014-12-19 NOTE — Consult Note (Signed)
Holly Mendoza is a 44 y.o. AA female with a history of recurrent ascites, believed to be secondary to pulmonary hypertension and requiring frequent paracentesis, and ESRD on dialysis since 1998 with failed renal transplant in 2012 and nephrectomy of transplanted kidney in 2013, now on dialysis at the [facility] with poor compliance, who had a paracentesis today at the time when dialysis was scheduled.  She presented to the ED at [facility] several hours after paracentesis with fatigue, nausea and vomiting and abdominal pain.  In the ED she was found to have a potassium of 7.2 with peaked T waves which prob represented RBBB.  Renal was asked to perform emergency dialysis.  Past Medical History  Diagnosis Date  . 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 Hypertension   . ESRD on hemodialysis 04/20/2014    Patient started HD in 1998.  She has been dialyzed in [place] at "[facility]" until moving to [place] in July 2015 to live with family.  She has had failed accesses in the L arm.  No attempt to place access was made in the R arm, patient is not sure why.  She has a L thigh AVG which she says has been functional for 7-8 years.  They stopped giving her heparin several years ago due to prolonged access bleeding.    Marland Kitchen Heart murmur   . Anemia of chronic disease   . History of blood transfusion     "several; w/transplant, infections, etc"  . Headache(784.0)     "often; sometimes daily" (04/26/2014)  . Migraines     "qod" (04/26/2014)  . ESRD on hemodialysis 04/20/2014    Patient started HD in 1998.  She has been dialyzed in [place] at "[facility]" until moving to [place] in July 2015 to live with family.  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  She has had failed accesses in the L arm.  No attempt to place access was made in the R arm, patient is not sure why.  She has a L thigh AVG which she says has been functional  for  . Ascites   . Pulmonary hypertension    Past Surgical History  Procedure Laterality Date  . Arteriovenous graft placement Left 2013    thigh  . 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 Nephrectomy transplanted organ  2013   Social History:  reports that she has never smoked. She has never used smokeless tobacco. She reports that she does not drink alcohol or use illicit drugs. Allergies:  Allergies  Allergen Reactions  . Contrast Media [Iodinated Diagnostic Agents] Anaphylaxis  . Procardia [Nifedipine] Other (See Comments)    Sweating, BP drops dangerously low,  . Vancomycin Anaphylaxis and Hives   Family History  Problem Relation Age of Onset  . Healthy Mother   . Liver disease Maternal Uncle     drinker  . Asthma Maternal Grandmother     Medications:  Prior to Admission:  Prescriptions prior to admission  Medication Sig Dispense Refill Last Dose  . acetaminophen (TYLENOL) 500 MG tablet Take 500 mg by mouth every 6 (six) hours as needed for moderate pain.   Past Week at Unknown time  . amLODipine (NORVASC) 10 MG tablet Take 1 tablet (10 mg total) by mouth daily. 30 tablet 3 12/18/2014 at Unknown time  .  aspirin EC 81 MG tablet Take 1 tablet (81 mg total) by mouth daily. 30 tablet 2 Past Month at Unknown time  . calcium acetate (PHOSLO) 667 MG capsule Take 667 mg by mouth 3 (three) times daily with meals.    12/18/2014 at Unknown time  . cloNIDine (CATAPRES) 0.3 MG tablet Take 0.3 mg by mouth 2 (two) times daily.   12/18/2014 at Unknown time  . esomeprazole (NEXIUM) 40 MG capsule Take 1 capsule (40 mg total) by mouth daily at 12 noon. 30 capsule 2 12/18/2014 at Unknown time  . Skin Protectants, Misc. (EUCERIN) cream Apply 1 application topically daily as needed for dry skin.    Past Week at Unknown time     ROS:  C/o discussing hospice and her other options, as per HPI  Blood pressure 162/94, pulse 103, temperature 98.4 F (36.9  C), temperature source Oral, resp. rate 18, SpO2 100 %.  General appearance: alert and cooperative Head: Normocephalic, without obvious abnormality, atraumatic Eyes: negative Throat: lips, mucosa, and tongue normal; teeth and gums normal Resp: clear to auscultation bilaterally Chest wall: no tenderness Cardio: regular rate and rhythm, S1, S2 normal, no murmur, click, rub or gallop GI: tender diffusely Extremities: left groin AVG Skin: Skin color, texture, turgor normal. No rashes or lesions Neurologic: Grossly normal Results for orders placed or performed during the hospital encounter of 12/19/14 (from the past 48 hour(s))  CBC with Differential/Platelet     Status: Abnormal   Collection Time: 12/19/14  6:29 PM  Result Value Ref Range   WBC 10.1 4.0 - 10.5 K/uL   RBC 4.04 3.87 - 5.11 MIL/uL   Hemoglobin 11.4 (L) 12.0 - 15.0 g/dL   HCT 36.6 36.0 - 46.0 %   MCV 90.6 78.0 - 100.0 fL   MCH 28.2 26.0 - 34.0 pg   MCHC 31.1 30.0 - 36.0 g/dL   RDW 14.6 11.5 - 15.5 %   Platelets 207 150 - 400 K/uL   Neutrophils Relative % 84 (H) 43 - 77 %   Neutro Abs 8.5 (H) 1.7 - 7.7 K/uL   Lymphocytes Relative 10 (L) 12 - 46 %   Lymphs Abs 1.0 0.7 - 4.0 K/uL   Monocytes Relative 5 3 - 12 %   Monocytes Absolute 0.5 0.1 - 1.0 K/uL   Eosinophils Relative 1 0 - 5 %   Eosinophils Absolute 0.1 0.0 - 0.7 K/uL   Basophils Relative 0 0 - 1 %   Basophils Absolute 0.0 0.0 - 0.1 K/uL  Comprehensive metabolic panel     Status: Abnormal   Collection Time: 12/19/14  6:29 PM  Result Value Ref Range   Sodium 139 135 - 145 mmol/L   Potassium 7.2 (HH) 3.5 - 5.1 mmol/L    Comment: REPEATED TO VERIFY NO VISIBLE HEMOLYSIS CRITICAL RESULT CALLED TO, READ BACK BY AND VERIFIED WITH: Jordan Hawks RN 2000 12/19/14 A NAVARRO    Chloride 99 96 - 112 mmol/L   CO2 23 19 - 32 mmol/L   Glucose, Bld 87 70 - 99 mg/dL   BUN 97 (H) 6 - 23 mg/dL   Creatinine, Ser 13.34 (H) 0.50 - 1.10 mg/dL   Calcium 8.5 8.4 - 10.5 mg/dL    Total Protein 5.5 (L) 6.0 - 8.3 g/dL   Albumin 2.2 (L) 3.5 - 5.2 g/dL   AST 14 0 - 37 U/L   ALT 17 0 - 35 U/L   Alkaline Phosphatase 95 39 - 117 U/L   Total  Bilirubin 0.5 0.3 - 1.2 mg/dL   GFR calc non Af Amer 3 (L) >90 mL/min   GFR calc Af Amer 3 (L) >90 mL/min    Comment: (NOTE) The eGFR has been calculated using the CKD EPI equation. This calculation has not been validated in all clinical situations. eGFR's persistently <90 mL/min signify possible Chronic Kidney Disease.    Anion gap 17 (H) 5 - 15  Ammonia     Status: None   Collection Time: 12/19/14  6:29 PM  Result Value Ref Range   Ammonia 25 11 - 32 umol/L  I-Stat CG4 Lactic Acid, ED     Status: None   Collection Time: 12/19/14  6:51 PM  Result Value Ref Range   Lactic Acid, Venous 0.95 0.5 - 2.0 mmol/L   US Paracentesis  12/19/2014   INDICATION: Pulmonary artery hypertension, portal hypertension, ESRD, recurrent ascites. Request is made for therapeutic paracentesis.  EXAM: ULTRASOUND-GUIDED THERAPEUTIC PARACENTESIS  COMPARISON:  Prior paracentesis on 12/05/2014  MEDICATIONS: None.  COMPLICATIONS: None immediate  TECHNIQUE: Informed written consent was obtained from the patient after a discussion of the risks, benefits and alternatives to treatment. A timeout was performed prior to the initiation of the procedure.  Initial ultrasound scanning demonstrates a large amount of ascites within the right mid to lower abdominal quadrant. The right mid to lower abdomen was prepped and draped in the usual sterile fashion. 1% lidocaine was used for local anesthesia. Under direct ultrasound guidance, a 19 gauge, 10-cm, Yueh catheter was introduced. An ultrasound image was saved for documentation purposed. The paracentesis was performed. The catheter was removed and a dressing was applied. The patient tolerated the procedure well without immediate post procedural complication.  FINDINGS: A total of approximately 6.3 liters of yellow fluid was  removed.  IMPRESSION: Successful ultrasound-guided therapeutic paracentesis yielding 6.3 liters of peritoneal fluid.  Read by: Rowe Robert, PA-C   Electronically Signed   By: Jacqulynn Cadet M.D.   On: 12/19/2014 12:11   Dg Abd Acute W/chest  12/19/2014   CLINICAL DATA:  Abdominal pain  EXAM: ACUTE ABDOMEN SERIES (ABDOMEN 2 VIEW & CHEST 1 VIEW)  COMPARISON:  Chest x-ray 08/31/2014  FINDINGS: There is no convincing bowel obstruction. No evidence of perforation or pneumatosis. Amorphous calcification in the right upper quadrant, up to 4 cm in diameter, is chronic based on chest x-ray from 08/31/2014. The location and morphology is unusual, question hepatic or peritoneal based calcification. Relationship to acute abdominal pain is doubtful given chronicity.  Small right pleural effusion. There is cardiomegaly and mild pulmonary venous congestion. No pneumonia.  IMPRESSION: 1. No acute findings. 2. Cardiomegaly and chronic small right pleural effusion. 3. Chronic indeterminate calcification in the right upper quadrant.   Electronically Signed   By: Monte Fantasia M.D.   On: 12/19/2014 21:44    Assessment:  1 Hyperkalemia 2 ESRD 3 Pulmonary hypertension and ascites 4 Liver cirrhosis and ascites  Plan: 1 Emergency dialysis 2 Counseling of pt.  Edwar Coe C 12/19/2014, 11:44 PM

## 2014-12-19 NOTE — Procedures (Signed)
Ultrasound-guided therapeutic paracentesis performed yielding 6.3 L of yellow fluid. No immediate complications.

## 2014-12-19 NOTE — ED Notes (Signed)
Pt had paracentesis today.  600 cc fluid.  Pt with n/v/d since procedure.

## 2014-12-19 NOTE — Procedures (Signed)
Tolerating hemodialysis which may be difficult after large volume paracentesis. Holly Mendoza C

## 2014-12-19 NOTE — Progress Notes (Addendum)
44yo female c/o N/V/D and abdominal pain since therapeutic paracentesis performed today, concern for SBP, to begin IV ABX.  Will start Rocephin 2g IV Q24H and monitor CBC and Cx.  Vernard Gambles, PharmD, BCPS 12/19/2014 11:22 PM    NOTE: Ceftriaxone is poorly dialyzed and does not need dose adjustment in HD patients. 2g IV q24h is appropriate for SPB.  Pharmacy to sign off as no additional dose adjustments will be required.  Aynslee Mulhall D. Tierrah Anastos, PharmD, BCPS Clinical Pharmacist Pager: [phone] 12/20/2014 11:51 AM

## 2014-12-19 NOTE — H&P (Signed)
[facility] History and Physical  Holly Mendoza PJS:[id number] DOB: August 27, 1971 DOA: 12/19/2014  Referring physician: ER physician. PCP: Dorrene German, MD   Chief Complaint: Nausea vomiting and abdominal pain.  HPI: Holly Mendoza is a 44 y.o. 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 Patient states that she has had paracentesis today following which she started developing generalized abdominal pain. Patient also had one episode diarrhea and multiple episodes of nausea vomiting. Patient had missed her dialysis today. In the ER patient was found to have elevated potassium of around 7.2 with EKG changes. Patient was given calcium gluconate IV along with D50 and IV insulin and on-call nephrologist was consulted and patient will be transferred to [facility] for dialysis. Patient states her pain is significant and hurts to eat. Pain is generalized. Denies any fever or chills. Patient otherwise denies any shortness of breath or chest pain.   Review of Systems: As presented in the history of presenting illness, rest negative.  Past Medical History  Diagnosis Date  . 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 Hypertension   . ESRD on hemodialysis 04/20/2014    Patient started HD in 1998.  She has been dialyzed in [place] at "[facility]" until moving to [place] in July 2015 to live with family.  She has had failed accesses in the L arm.  No attempt to place access was made in the R arm, patient is not sure why.  She has a L thigh AVG which she says has been functional for 7-8 years.  They stopped giving her heparin several years ago due to prolonged access bleeding.    Marland Kitchen Heart murmur   . Anemia of chronic disease   . History of blood transfusion     "several; w/transplant, infections, etc"  . Headache(784.0)    "often; sometimes daily" (04/26/2014)  . Migraines     "qod" (04/26/2014)  . ESRD on hemodialysis 04/20/2014    Patient started HD in 1998.  She has been dialyzed in [place] at "[facility]" until moving to [place] in July 2015 to live with family.  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  She has had failed accesses in the L arm.  No attempt to place access was made in the R arm, patient is not sure why.  She has a L thigh AVG which she says has been functional for  . Ascites   . Pulmonary hypertension    Past Surgical History  Procedure Laterality Date  . Arteriovenous graft placement Left 2013    thigh  . 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 Nephrectomy transplanted organ  2013   Social History:  reports that she has never smoked. She has never used smokeless tobacco. She reports that she does not drink alcohol or use illicit drugs. Where does patient live at home. Can patient participate in ADLs? Yes.  Allergies  Allergen Reactions  . Contrast Media [Iodinated Diagnostic Agents] Anaphylaxis  . Procardia [Nifedipine] Other (See Comments)    Sweating, BP drops dangerously low,  . Vancomycin Anaphylaxis and Hives    Family History:  Family History  Problem Relation Age of Onset  . Healthy Mother   . Liver disease Maternal Uncle  drinker  . Asthma Maternal Grandmother       Prior to Admission medications   Medication Sig Start Date End Date Taking? Authorizing Provider  acetaminophen (TYLENOL) 500 MG tablet Take 500 mg by mouth every 6 (six) hours as needed for moderate pain.   Yes Historical Provider, MD  amLODipine (NORVASC) 10 MG tablet Take 1 tablet (10 mg total) by mouth daily. 09/11/14  Yes Dolores Patty, MD  aspirin EC 81 MG tablet Take 1 tablet (81 mg total) by mouth daily. 09/01/14  Yes Richarda Overlie, MD  calcium acetate (PHOSLO) 667 MG capsule Take 667 mg by mouth 3  (three) times daily with meals.    Yes Historical Provider, MD  cloNIDine (CATAPRES) 0.3 MG tablet Take 0.3 mg by mouth 2 (two) times daily.   Yes Historical Provider, MD  esomeprazole (NEXIUM) 40 MG capsule Take 1 capsule (40 mg total) by mouth daily at 12 noon. 09/06/14  Yes Richarda Overlie, MD  Skin Protectants, Misc. (EUCERIN) cream Apply 1 application topically daily as needed for dry skin.    Yes Historical Provider, MD    Physical Exam: Filed Vitals:   12/19/14 1650 12/19/14 1912 12/19/14 2034  BP: 155/95 141/81 161/88  Pulse: 90 86 98  Temp: 98.1 F (36.7 C) 98.9 F (37.2 C)   TempSrc: Oral Oral   Resp: SpO2: 98% 98% 98%     General:  Moderately built and nourished.  Eyes: Anicteric no pallor.  ENT: No discharge from the ears eyes nose and mouth.  Neck: No mass felt.  Cardiovascular: S1-S2 heard.  Respiratory: No rhonchi or crepitations.  Abdomen: Soft mild tenderness no guarding or rigidity.  Skin: No rash.  Musculoskeletal: No edema.  Psychiatric: Appears normal.  Neurologic: Alert awake oriented to time place and person. Moves all extremities.  Labs on Admission:  Basic Metabolic Panel:  Recent Labs Lab 12/19/14 1829  NA 139  K 7.2*  CL 99  CO2 23  GLUCOSE 87  BUN 97*  CREATININE 13.34*  CALCIUM 8.5   Liver Function Tests:  Recent Labs Lab 12/19/14 1829  AST 14  ALT 17  ALKPHOS 95  BILITOT 0.5  PROT 5.5*  ALBUMIN 2.2*   No results for input(s): LIPASE, AMYLASE in the last 168 hours.  Recent Labs Lab 12/19/14 1829  AMMONIA 25   CBC:  Recent Labs Lab 12/19/14 1829  WBC 10.1  NEUTROABS 8.5*  HGB 11.4*  HCT 36.6  MCV 90.6  PLT 207   Cardiac Enzymes: No results for input(s): CKTOTAL, CKMB, CKMBINDEX, TROPONINI in the last 168 hours.  BNP (last 3 results) No results for input(s): BNP in the last 8760 hours.  ProBNP (last 3 results)  Recent Labs  08/31/14 1806  PROBNP 46663.0*    CBG: No results for  input(s): GLUCAP in the last 168 hours.  Radiological Exams on Admission: US Paracentesis  12/19/2014   INDICATION: Pulmonary artery hypertension, portal hypertension, ESRD, recurrent ascites. Request is made for therapeutic paracentesis.  EXAM: ULTRASOUND-GUIDED THERAPEUTIC PARACENTESIS  COMPARISON:  Prior paracentesis on 12/05/2014  MEDICATIONS: None.  COMPLICATIONS: None immediate  TECHNIQUE: Informed written consent was obtained from the patient after a discussion of the risks, benefits and alternatives to treatment. A timeout was performed prior to the initiation of the procedure.  Initial ultrasound scanning demonstrates a large amount of ascites within the right mid to lower abdominal quadrant. The right mid to lower abdomen was prepped and draped in the  usual sterile fashion. 1% lidocaine was used for local anesthesia. Under direct ultrasound guidance, a 19 gauge, 10-cm, Yueh catheter was introduced. An ultrasound image was saved for documentation purposed. The paracentesis was performed. The catheter was removed and a dressing was applied. The patient tolerated the procedure well without immediate post procedural complication.  FINDINGS: A total of approximately 6.3 liters of yellow fluid was removed.  IMPRESSION: Successful ultrasound-guided therapeutic paracentesis yielding 6.3 liters of peritoneal fluid.  Read by: Jeananne Rama, PA-C   Electronically Signed   By: Malachy Moan M.D.   On: 12/19/2014 12:11    EKG: Independently reviewed. Normal sinus rhythm with RBBB and prominent T waves.  Assessment/Plan Principal Problem:   Hyperkalemia Active Problems:   ESRD on hemodialysis   Hx of ascites   HTN (hypertension)   Abdominal pain   1. Hyperkalemia in a patient with ESRD on dialysis - on call nephrologist was notified about patient and patient will be transferred to [facility] for urgent dialysis. Patient did miss her dialysis today. 2. Abdominal pain with nausea vomiting and one  episode of diarrhea - patient complains of significant pain. Acute abdominal series is pending and we will also get a CT abdomen and pelvis. Patient at this time will be placed temporally on antibiotics for possible SBP. Patient will be kept nothing by mouth. Further recommendation based on CT findings and clinical course. Check stool for C. Difficile. 3. Hypertension - since patient is nothing by mouth I have placed patient on clonidine patch and when necessary IV hydralazine which can be changed back to her home dose of oral clonidine and Norvasc if patient starts taking orally. Closely follow blood pressure trends. 4. Recurrent ascites with cirrhosis - patient's cause of cirrhosis is not clear. At this time GI and cardiology is is following. See #2. 5. Pulmonary hypertension.  Patient is being transferred to [facility] for dialysis. Patient is agreeable to the plan. Dr. Lanae Boast will be the accepting physician.   DVT Prophylaxis SCDs. If there is nothing acute in the CT abdomen and then may change to Lovenox.  Code Status: Full code.  Family Communication: Family at the bedside.  Disposition Plan: Admit to inpatient.    Jaire Pinkham N. [facility] Pager [phone].  If 7PM-7AM, please contact night-coverage [email or web address] Password [email or web address] 12/19/2014, 9:09 PM

## 2014-12-19 NOTE — ED Provider Notes (Signed)
CSN: [id number]     Arrival date & time 12/19/14  1630 History   First MD Initiated Contact with Patient 12/19/14 1736     Chief Complaint  Patient presents with  . Emesis  . Diarrhea     (Consider location/radiation/quality/duration/timing/severity/associated sxs/prior Treatment) The history is provided by the patient and medical records. No language interpreter was used.     Holly Mendoza is a 44 y.o. female  with a hx of ESRD on hemodialysis (M, W, F), kidney transplant in 2012 which failed, HTN, heart murmur, anemia of chronic disease, migraines, ascites, pulmonary HTN presents to the Emergency Department complaining of gradual, persistent, progressively worsening fatigue with associated N/V/D onset several hours PTA. Pt reports she had a therapeutic paracentesis at 10am today; her last was on 12/05/14.  Pt reports symptoms began approx 10 min after the paracentesis.  Pt had 6L removed. Associated symptoms include lightheadedness, weakness, dizziness, abd pain.  Nothing seems to make the patient better or worse.  Pt denies fever, chills, headache, neck pain, chest pain, SOB, syncope. Pt does not make urine.       Past Medical History  Diagnosis Date  . 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 Hypertension   . ESRD on hemodialysis 04/20/2014    Patient started HD in 1998.  She has been dialyzed in [place] at "[facility]" until moving to [place] in July 2015 to live with family.  She has had failed accesses in the L arm.  No attempt to place access was made in the R arm, patient is not sure why.  She has a L thigh AVG which she says has been functional for 7-8 years.  They stopped giving her heparin several years ago due to prolonged access bleeding.    Marland Kitchen Heart murmur   . Anemia of chronic disease   . History of blood transfusion     "several; w/transplant, infections, etc"  . Headache(784.0)     "often; sometimes daily" (04/26/2014)  . Migraines      "qod" (04/26/2014)  . ESRD on hemodialysis 04/20/2014    Patient started HD in 1998.  She has been dialyzed in [place] at "[facility]" until moving to [place] in July 2015 to live with family.  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  She has had failed accesses in the L arm.  No attempt to place access was made in the R arm, patient is not sure why.  She has a L thigh AVG which she says has been functional for  . Ascites   . Pulmonary hypertension    Past Surgical History  Procedure Laterality Date  . Arteriovenous graft placement Left 2013    thigh  . 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 Nephrectomy transplanted organ  2013   Family History  Problem Relation Age of Onset  . Healthy Mother   . Liver disease Maternal Uncle     drinker  . Asthma Maternal Grandmother    History  Substance Use Topics  . Smoking status: Never Smoker   . Smokeless tobacco: Never Used  . Alcohol Use: No   OB History    No data available     Review of Systems  Constitutional: Negative for fever, diaphoresis, appetite change, fatigue and unexpected weight change.  HENT: Negative for mouth sores and  trouble swallowing.   Respiratory: Negative for cough, chest tightness, shortness of breath, wheezing and stridor.   Cardiovascular: Negative for chest pain and palpitations.  Gastrointestinal: Positive for nausea, vomiting, abdominal pain and diarrhea. Negative for constipation, blood in stool, abdominal distention and rectal pain.  Genitourinary: Negative for dysuria, urgency, frequency, hematuria, flank pain and difficulty urinating.  Musculoskeletal: Negative for back pain, neck pain and neck stiffness.  Skin: Negative for rash.  Neurological: Negative for weakness.  Hematological: Negative for adenopathy.  Psychiatric/Behavioral: Negative for confusion.  All other systems reviewed and are  negative.     Allergies  Contrast media; Procardia; and Vancomycin  Home Medications   Prior to Admission medications   Medication Sig Start Date End Date Taking? Authorizing Provider  acetaminophen (TYLENOL) 500 MG tablet Take 500 mg by mouth every 6 (six) hours as needed for moderate pain.   Yes Historical Provider, MD  amLODipine (NORVASC) 10 MG tablet Take 1 tablet (10 mg total) by mouth daily. 09/11/14  Yes Dolores Patty, MD  aspirin EC 81 MG tablet Take 1 tablet (81 mg total) by mouth daily. 09/01/14  Yes Richarda Overlie, MD  calcium acetate (PHOSLO) 667 MG capsule Take 667 mg by mouth 3 (three) times daily with meals.    Yes Historical Provider, MD  cloNIDine (CATAPRES) 0.3 MG tablet Take 0.3 mg by mouth 2 (two) times daily.   Yes Historical Provider, MD  esomeprazole (NEXIUM) 40 MG capsule Take 1 capsule (40 mg total) by mouth daily at 12 noon. 09/06/14  Yes Richarda Overlie, MD  Skin Protectants, Misc. (EUCERIN) cream Apply 1 application topically daily as needed for dry skin.    Yes Historical Provider, MD   BP 161/88 mmHg  Pulse 98  Temp(Src) 98.9 F (37.2 C) (Oral)  Resp 18  SpO2 98% Physical Exam  Constitutional: She appears well-developed and well-nourished. She appears distressed.  Awake, alert, nontoxic appearance Pt appears uncomfortable in bed  HENT:  Head: Normocephalic and atraumatic.  Mouth/Throat: Oropharynx is clear and moist. No oropharyngeal exudate.  Eyes: Conjunctivae are normal. No scleral icterus.  Neck: Normal range of motion. Neck supple.  Cardiovascular: Normal rate, regular rhythm, normal heart sounds and intact distal pulses.   No murmur heard. Pulmonary/Chest: Effort normal and breath sounds normal. No respiratory distress. She has no wheezes.  Equal chest expansion  Abdominal: Soft. Bowel sounds are normal. She exhibits no distension and no mass. There is tenderness. There is guarding. There is no rebound.  Musculoskeletal: Normal range of  motion. She exhibits no edema.  Neurological: She is alert. She exhibits normal muscle tone. Coordination normal.  Speech is clear and goal oriented Moves extremities without ataxia  Skin: Skin is warm and dry. She is not diaphoretic. No erythema.  Psychiatric: She has a normal mood and affect.  Nursing note and vitals reviewed.   ED Course  Procedures (including critical care time) Labs Review Labs Reviewed  CBC WITH DIFFERENTIAL/PLATELET - Abnormal; Notable for the following:    Hemoglobin 11.4 (*)    Neutrophils Relative % 84 (*)    Neutro Abs 8.5 (*)    Lymphocytes Relative 10 (*)    All other components within normal limits  COMPREHENSIVE METABOLIC PANEL - Abnormal; Notable for the following:    Potassium 7.2 (*)    BUN 97 (*)    Creatinine, Ser 13.34 (*)    Total Protein 5.5 (*)    Albumin 2.2 (*)    GFR calc non Af  Amer 3 (*)    GFR calc Af Amer 3 (*)    Anion gap 17 (*)    All other components within normal limits  AMMONIA  I-STAT CG4 LACTIC ACID, ED    Imaging Review US Paracentesis  12/19/2014   INDICATION: Pulmonary artery hypertension, portal hypertension, ESRD, recurrent ascites. Request is made for therapeutic paracentesis.  EXAM: ULTRASOUND-GUIDED THERAPEUTIC PARACENTESIS  COMPARISON:  Prior paracentesis on 12/05/2014  MEDICATIONS: None.  COMPLICATIONS: None immediate  TECHNIQUE: Informed written consent was obtained from the patient after a discussion of the risks, benefits and alternatives to treatment. A timeout was performed prior to the initiation of the procedure.  Initial ultrasound scanning demonstrates a large amount of ascites within the right mid to lower abdominal quadrant. The right mid to lower abdomen was prepped and draped in the usual sterile fashion. 1% lidocaine was used for local anesthesia. Under direct ultrasound guidance, a 19 gauge, 10-cm, Yueh catheter was introduced. An ultrasound image was saved for documentation purposed. The paracentesis  was performed. The catheter was removed and a dressing was applied. The patient tolerated the procedure well without immediate post procedural complication.  FINDINGS: A total of approximately 6.3 liters of yellow fluid was removed.  IMPRESSION: Successful ultrasound-guided therapeutic paracentesis yielding 6.3 liters of peritoneal fluid.  Read by: Jeananne Rama, PA-C   Electronically Signed   By: Malachy Moan M.D.   On: 12/19/2014 12:11     EKG Interpretation   Date/Time:  Wednesday December 19 2014 20:11:42 EST Ventricular Rate:  95 PR Interval:    QRS Duration: 156 QT Interval:  416 QTC Calculation: 523 R Axis:   136 Text Interpretation:  Atrial fibrillation RBBB and LPFB Borderline ST  depression, lateral leads Since last tracing (no P-waves found) rhythym is  regular Confirmed by Effie Shy  MD, ELLIOTT [id number]) on 12/19/2014 8:20:14 PM      MDM   Final diagnoses:  Nausea vomiting and diarrhea  Right sided abdominal pain  Hyperkalemia   Holly Mendoza presents with abd pain, N/V/D after therapeutic paracentesis this morning. Patient without any nausea or vomiting here. Abdomen is soft. Labs pending.  Pt is afebrile, doubt bacterial peritonitis at this time.  Question possible viral process.    8:00PM Patient with increased potassium at 7.2. EKG with slightly peaked T waves.  We'll discuss with nephrology about dialysis versus medical management.  8:42 PM Discussed with Dr. Arrie Aran who will ready the dialysis unit for her.  Pt to be given calcium and insulin here in the ED.    8:55 PM Discussed with Dr. Toniann Fail who will admit to [facility].  Pt will need admission at [facility] for dialysis and Dr. Julian Reil is the accepting at [facility].  Pt to be transferred.    BP 161/88 mmHg  Pulse 98  Temp(Src) 98.9 F (37.2 C) (Oral)  Resp 18  SpO2 98%   Dierdre Forth, PA-C 12/19/14 2056  Flint Melter, MD 12/20/14 0021

## 2014-12-20 ENCOUNTER — Inpatient Hospital Stay (HOSPITAL_COMMUNITY): Payer: Medicare Other

## 2014-12-20 DIAGNOSIS — K652 Spontaneous bacterial peritonitis: Secondary | ICD-10-CM

## 2014-12-20 LAB — COMPREHENSIVE METABOLIC PANEL
ALT: 17 U/L (ref 0–35)
AST: 18 U/L (ref 0–37)
Albumin: 2 g/dL — ABNORMAL LOW (ref 3.5–5.2)
Alkaline Phosphatase: 102 U/L (ref 39–117)
Anion gap: 13 (ref 5–15)
BUN: 41 mg/dL — ABNORMAL HIGH (ref 6–23)
CO2: 28 mmol/L (ref 19–32)
CREATININE: 7.3 mg/dL — AB (ref 0.50–1.10)
Calcium: 8.4 mg/dL (ref 8.4–10.5)
Chloride: 97 mmol/L (ref 96–112)
GFR calc Af Amer: 7 mL/min — ABNORMAL LOW (ref >90)
GFR calc non Af Amer: 6 mL/min — ABNORMAL LOW (ref >90)
Glucose, Bld: 106 mg/dL — ABNORMAL HIGH (ref 70–99)
POTASSIUM: 4.1 mmol/L (ref 3.5–5.1)
Sodium: 138 mmol/L (ref 135–145)
TOTAL PROTEIN: 5.5 g/dL — AB (ref 6.0–8.3)
Total Bilirubin: 0.5 mg/dL (ref 0.3–1.2)

## 2014-12-20 LAB — CBC WITH DIFFERENTIAL/PLATELET
Basophils Absolute: 0 10*3/uL (ref 0.0–0.1)
Basophils Relative: 0 % (ref 0–1)
Eosinophils Absolute: 0 10*3/uL (ref 0.0–0.7)
Eosinophils Relative: 0 % (ref 0–5)
HEMATOCRIT: 30.9 % — AB (ref 36.0–46.0)
Hemoglobin: 9.8 g/dL — ABNORMAL LOW (ref 12.0–15.0)
LYMPHS PCT: 4 % — AB (ref 12–46)
Lymphs Abs: 0.5 10*3/uL — ABNORMAL LOW (ref 0.7–4.0)
MCH: 27.8 pg (ref 26.0–34.0)
MCHC: 31.7 g/dL (ref 30.0–36.0)
MCV: 87.5 fL (ref 78.0–100.0)
MONO ABS: 1.2 10*3/uL — AB (ref 0.1–1.0)
Monocytes Relative: 9 % (ref 3–12)
NEUTROS ABS: 10.7 10*3/uL — AB (ref 1.7–7.7)
Neutrophils Relative %: 87 % — ABNORMAL HIGH (ref 43–77)
Platelets: 211 10*3/uL (ref 150–400)
RBC: 3.53 MIL/uL — ABNORMAL LOW (ref 3.87–5.11)
RDW: 14.6 % (ref 11.5–15.5)
WBC: 12.3 10*3/uL — AB (ref 4.0–10.5)

## 2014-12-20 LAB — HCG, SERUM, QUALITATIVE: Preg, Serum: NEGATIVE

## 2014-12-20 LAB — GLUCOSE, CAPILLARY: Glucose-Capillary: 89 mg/dL (ref 70–99)

## 2014-12-20 LAB — MRSA PCR SCREENING: MRSA by PCR: NEGATIVE

## 2014-12-20 LAB — LIPASE, BLOOD: LIPASE: 23 U/L (ref 11–59)

## 2014-12-20 MED ORDER — PRO-STAT SUGAR FREE PO LIQD
30.0000 mL | Freq: Two times a day (BID) | ORAL | Status: DC
  Administered 2014-12-20 (×2): 30 mL via ORAL
  Filled 2014-12-20 (×6): qty 30

## 2014-12-20 MED ORDER — CLONIDINE HCL 0.3 MG PO TABS
0.3000 mg | ORAL_TABLET | Freq: Two times a day (BID) | ORAL | Status: DC
  Administered 2014-12-20 (×2): 0.3 mg via ORAL
  Filled 2014-12-20 (×4): qty 1
  Filled 2014-12-20 (×2): qty 3

## 2014-12-20 MED ORDER — NEPRO/CARBSTEADY PO LIQD: 237.0000 mL | ORAL | Status: DC | PRN

## 2014-12-20 MED ORDER — LIDOCAINE HCL (PF) 1 % IJ SOLN: 5.0000 mL | INTRAMUSCULAR | Status: DC | PRN

## 2014-12-20 MED ORDER — HEPARIN SODIUM (PORCINE) 1000 UNIT/ML DIALYSIS
20.0000 [IU]/kg | INTRAMUSCULAR | Status: DC | PRN
  Filled 2014-12-20: qty 2

## 2014-12-20 MED ORDER — HEPARIN SODIUM (PORCINE) 1000 UNIT/ML DIALYSIS
1000.0000 [IU] | INTRAMUSCULAR | Status: DC | PRN
  Filled 2014-12-20: qty 1

## 2014-12-20 MED ORDER — ALTEPLASE 2 MG IJ SOLR
2.0000 mg | Freq: Once | INTRAMUSCULAR | Status: DC | PRN
  Filled 2014-12-20: qty 2

## 2014-12-20 MED ORDER — PENTAFLUOROPROP-TETRAFLUOROETH EX AERO: 1.0000 "application " | INHALATION_SPRAY | CUTANEOUS | Status: DC | PRN

## 2014-12-20 MED ORDER — SODIUM CHLORIDE 0.9 % IV SOLN: 100.0000 mL | INTRAVENOUS | Status: DC | PRN

## 2014-12-20 MED ORDER — CLONIDINE HCL 0.3 MG PO TABS
0.3000 mg | ORAL_TABLET | Freq: Two times a day (BID) | ORAL | Status: DC
  Filled 2014-12-20 (×2): qty 1

## 2014-12-20 MED ORDER — AMLODIPINE BESYLATE 10 MG PO TABS
10.0000 mg | ORAL_TABLET | Freq: Every day | ORAL | Status: DC
  Administered 2014-12-20: 10 mg via ORAL
  Filled 2014-12-20 (×2): qty 1

## 2014-12-20 MED ORDER — CALCIUM ACETATE 667 MG PO CAPS
667.0000 mg | ORAL_CAPSULE | Freq: Three times a day (TID) | ORAL | Status: DC
  Administered 2014-12-21 (×2): 667 mg via ORAL
  Filled 2014-12-20 (×5): qty 1

## 2014-12-20 MED ORDER — LIDOCAINE-PRILOCAINE 2.5-2.5 % EX CREA: 1.0000 "application " | TOPICAL_CREAM | CUTANEOUS | Status: DC | PRN

## 2014-12-20 MED ORDER — DOXERCALCIFEROL 4 MCG/2ML IV SOLN
8.0000 ug | INTRAVENOUS | Status: DC
  Administered 2014-12-21: 8 ug via INTRAVENOUS
  Filled 2014-12-20: qty 4

## 2014-12-20 MED ORDER — RENA-VITE PO TABS
1.0000 | ORAL_TABLET | Freq: Every day | ORAL | Status: DC
  Administered 2014-12-20: 1 via ORAL
  Filled 2014-12-20 (×2): qty 1

## 2014-12-20 MED ORDER — SODIUM CHLORIDE 0.9 % IV SOLN
62.5000 mg | Freq: Once | INTRAVENOUS | Status: AC
  Administered 2014-12-20: 62.5 mg via INTRAVENOUS
  Filled 2014-12-20 (×2): qty 5

## 2014-12-20 NOTE — Progress Notes (Signed)
Chaplain responded to page that pt wanted to speak to a chaplain and had requested a bible. Chaplain brought a bible and provided empathic listening. Pt sought advice/guidance from chaplain about how to deal with a conflict within the framework of her spirituality. Chaplain encouraged pt to express her needs clearly and in a non-judgmental way; pt liked this idea. Chaplain provided prayer. Pt expressed gratitude and said "I feel better now" after the prayer. Will inform unit chaplain for follow-up as needed.   Wille Glaser 12/20/2014 12:03 PM

## 2014-12-20 NOTE — Progress Notes (Addendum)
PROGRESS NOTE  Holly Mendoza YKD:[id number] DOB: [birth year] DOA: 12/19/2014 PCP: Dorrene German, MD  Assessment/Plan: Hyperkalemia in a patient with ESRD on dialysis - s/p dialysis  Abdominal pain with nausea vomiting and one episode of diarrhea - suspect SBP,  -CT abdomen and pelvis pending- ordered paracentesis for culture and cell count but would not do the same day as dialysis -pain with palpation -refused abx last PM- spoke at length with patient regarding symptoms and need for IV abx- agreeable today -Check stool for C. Difficile.  Hypertension - since patient is nothing by mouth I have placed patient on clonidine patch and when necessary IV hydralazine which can be changed back to her home dose of oral clonidine and Norvasc if patient starts taking orally. Closely follow blood pressure trends.  Recurrent ascites with cirrhosis - patient's cause of cirrhosis is not clear. At this time GI and cardiology is is following outpatient  Pulmonary hypertension.  Code Status: full Family Communication: patient Disposition Plan:    Consultants:  renal  Procedures:      HPI/Subjective: Feeling some better Pain with palpation of abd  Objective: Filed Vitals:   12/20/14 0342  BP: 150/105  Pulse: 121  Temp: 97.7 F (36.5 C)  Resp: 20    Intake/Output Summary (Last 24 hours) at 12/20/14 0800 Last data filed at 12/20/14 0600  Gross per 24 hour  Intake      0 ml  Output    750 ml  Net   -750 ml   Filed Weights   12/19/14 2330  Weight: 78.5 kg (173 lb 1 oz)    Exam:   General:  awake  Cardiovascular: rrr  Respiratory: clear  Abdomen: + rebound tenderness  Musculoskeletal: no edema   Data Reviewed: Basic Metabolic Panel:  Recent Labs Lab 12/19/14 1829 12/20/14 0344  NA 139 138  K 7.2* 4.1  CL 99 97  CO2 23 28  GLUCOSE 87 106*  BUN 97* 41*  CREATININE 13.34* 7.30*  CALCIUM 8.5 8.4   Liver Function Tests:  Recent Labs Lab 12/19/14 1829  12/20/14 0344  AST 14 18  ALT 17 17  ALKPHOS 95 102  BILITOT 0.5 0.5  PROT 5.5* 5.5*  ALBUMIN 2.2* 2.0*    Recent Labs Lab 12/20/14 0344  LIPASE 23    Recent Labs Lab 12/19/14 1829  AMMONIA 25   CBC:  Recent Labs Lab 12/19/14 1829 12/20/14 0344  WBC 10.1 12.3*  NEUTROABS 8.5* 10.7*  HGB 11.4* 9.8*  HCT 36.6 30.9*  MCV 90.6 87.5  PLT 207 211   Cardiac Enzymes: No results for input(s): CKTOTAL, CKMB, CKMBINDEX, TROPONINI in the last 168 hours. BNP (last 3 results) No results for input(s): BNP in the last 8760 hours.  ProBNP (last 3 results)  Recent Labs  08/31/14 1806  PROBNP 46663.0*    CBG:  Recent Labs Lab 12/20/14 0429  GLUCAP 89    Recent Results (from the past 240 hour(s))  MRSA PCR Screening     Status: None   Collection Time: 12/20/14  1:47 AM  Result Value Ref Range Status   MRSA by PCR NEGATIVE NEGATIVE Final    Comment:        The GeneXpert MRSA Assay (FDA approved for NASAL specimens only), is one component of a comprehensive MRSA colonization surveillance program. It is not intended to diagnose MRSA infection nor to guide or monitor treatment for MRSA infections.      Studies: US Paracentesis  12/19/2014  INDICATION: Pulmonary artery hypertension, portal hypertension, ESRD, recurrent ascites. Request is made for therapeutic paracentesis.  EXAM: ULTRASOUND-GUIDED THERAPEUTIC PARACENTESIS  COMPARISON:  Prior paracentesis on 12/05/2014  MEDICATIONS: None.  COMPLICATIONS: None immediate  TECHNIQUE: Informed written consent was obtained from the patient after a discussion of the risks, benefits and alternatives to treatment. A timeout was performed prior to the initiation of the procedure.  Initial ultrasound scanning demonstrates a large amount of ascites within the right mid to lower abdominal quadrant. The right mid to lower abdomen was prepped and draped in the usual sterile fashion. 1% lidocaine was used for local anesthesia. Under  direct ultrasound guidance, a 19 gauge, 10-cm, Yueh catheter was introduced. An ultrasound image was saved for documentation purposed. The paracentesis was performed. The catheter was removed and a dressing was applied. The patient tolerated the procedure well without immediate post procedural complication.  FINDINGS: A total of approximately 6.3 liters of yellow fluid was removed.  IMPRESSION: Successful ultrasound-guided therapeutic paracentesis yielding 6.3 liters of peritoneal fluid.  Read by: Jeananne Rama, PA-C   Electronically Signed   By: Malachy Moan M.D.   On: 12/19/2014 12:11   Dg Abd Acute W/chest  12/19/2014   CLINICAL DATA:  Abdominal pain  EXAM: ACUTE ABDOMEN SERIES (ABDOMEN 2 VIEW & CHEST 1 VIEW)  COMPARISON:  Chest x-ray 08/31/2014  FINDINGS: There is no convincing bowel obstruction. No evidence of perforation or pneumatosis. Amorphous calcification in the right upper quadrant, up to 4 cm in diameter, is chronic based on chest x-ray from 08/31/2014. The location and morphology is unusual, question hepatic or peritoneal based calcification. Relationship to acute abdominal pain is doubtful given chronicity.  Small right pleural effusion. There is cardiomegaly and mild pulmonary venous congestion. No pneumonia.  IMPRESSION: 1. No acute findings. 2. Cardiomegaly and chronic small right pleural effusion. 3. Chronic indeterminate calcification in the right upper quadrant.   Electronically Signed   By: Marnee Spring M.D.   On: 12/19/2014 21:44    Scheduled Meds: . cefTRIAXone (ROCEPHIN)  IV  2 g Intravenous Q24H  . cloNIDine  0.3 mg Oral BID  . sodium chloride  3 mL Intravenous Q12H   Continuous Infusions:  Antibiotics Given (last 72 hours)    None      Principal Problem:   Hyperkalemia Active Problems:   ESRD on hemodialysis   Hx of ascites   HTN (hypertension)   Abdominal pain    Time spent: 35 min    Tommie Dejoseph  [facility] Pager [phone]. If 7PM-7AM,  please contact night-coverage at [email or web address], password [email or web address] 12/20/2014, 8:00 AM  LOS: 1 day

## 2014-12-20 NOTE — Progress Notes (Signed)
Subjective:   Feeling better, agrees to HD again today  Objective Filed Vitals:   12/20/14 0230 12/20/14 0301 12/20/14 0342 12/20/14 0624  BP: 148/89 132/81 150/105   Pulse:   121   Temp:   97.7 F (36.5 C)   TempSrc:   Oral   Resp: Height:     (1.727 m)  Weight:      SpO2:   90%    Physical Exam General: Alert and oriented. No acute distress Heart: RRR Gr2/6 M Lungs: Dim bases. CTA. Unlabored. Rale in based Abdomen: soft, mild generalized tenderness. Ascites mild , umb hernia Extremities: +2 LE edema Dialysis Access:  L thigh AVG +b/t  Assessment/Plan: 1. Hyperkalemia- resolved with HD. History of noncompliance 2. Abdominal pain- improved this AM. S/p paracentesis yesterday. Rocephin started. CT- no evidence of bowel obstruction 2. ESRD - MWF [facility], agrees to HD again today. Vol xs, if adequately dialyzed , would probably not need paracentesis.  Bp ^ c/w vol xs also 3. Anemia -  hgb 9.8, no ESA watch CBC, cont weekly Fe.  4. Secondary hyperparathyroidism - Ca+ 8.4. Cont hectorol and phoslo when diet adcanced 5. HTN/volume - 150/105 Amlodipine and clonidine- home meds, needs lower edw. intersitial edema/effusion on CT  6. Nutrition - NPO 7. DM- per primary 8 Nonadhernce a pattern.  Needs adequate vol off.   Jetty Duhamel, NP [facility] [phone] 12/20/2014,8:48 AM  LOS: 1 day  I have seen and examined this patient and agree with the plan of care seen, examined, eval, counseled.  Little insight.  Needs adequate HD to lower vol.  Her attitude limits care. .  Janaiah Vetrano L 12/20/2014, 10:21 AM   Additional Objective Labs: Basic Metabolic Panel:  Recent Labs Lab 12/19/14 1829 12/20/14 0344  NA 139 138  K 7.2* 4.1  CL 99 97  CO2 23 28  GLUCOSE 87 106*  BUN 97* 41*  CREATININE 13.34* 7.30*  CALCIUM 8.5 8.4   Liver Function Tests:  Recent Labs Lab 12/19/14 1829 12/20/14 0344  AST 14 18  ALT 17 17  ALKPHOS 95 102   BILITOT 0.5 0.5  PROT 5.5* 5.5*  ALBUMIN 2.2* 2.0*    Recent Labs Lab 12/20/14 0344  LIPASE 23   CBC:  Recent Labs Lab 12/19/14 1829 12/20/14 0344  WBC 10.1 12.3*  NEUTROABS 8.5* 10.7*  HGB 11.4* 9.8*  HCT 36.6 30.9*  MCV 90.6 87.5  PLT 207 211   Blood Culture No results found for: SDES, SPECREQUEST, CULT, REPTSTATUS  Cardiac Enzymes: No results for input(s): CKTOTAL, CKMB, CKMBINDEX, TROPONINI in the last 168 hours. CBG:  Recent Labs Lab 12/20/14 0429  GLUCAP 89   Iron Studies: No results for input(s): IRON, TIBC, TRANSFERRIN, FERRITIN in the last 72 hours. @ Studies/Results: Ct Abdomen Pelvis Wo Contrast  12/20/2014   CLINICAL DATA:  Right lower abdominal pain, failed renal transplant  EXAM: CT ABDOMEN AND PELVIS WITHOUT CONTRAST  TECHNIQUE: Multidetector CT imaging of the abdomen and pelvis was performed following the standard protocol without IV contrast.  COMPARISON:  Abdominal ultrasound dated 09/18/2014  FINDINGS: Lower chest:  Small right pleural effusion.  Mild ground-glass opacities in the bilateral lower lobes, possibly interstitial edema or less likely infection.  Hepatobiliary: Liver is grossly unremarkable.  Gallbladder is notable for layering sludge. No intrahepatic or extrahepatic ductal dilatation.  Pancreas: Within normal limits.  Spleen: Suspected prior splenic infarct (series 201/ image 25).  Adrenals/Urinary Tract: Adrenal  glands are unremarkable.  Renal atrophy with multiple bilateral renal cysts, including a dominant 4.5 cm lateral right upper pole cyst (series 21/ image 39). No hydronephrosis.  Stomach/Bowel: Stomach is within normal limits.  Visualized bowel is unremarkable.  No evidence of bowel obstruction.  Vascular/Lymphatic: Atherosclerotic calcifications of the abdominal aorta and branch vessels.  No suspicious abdominopelvic lymphadenopathy.  Reproductive: Uterus is unremarkable.  Bilateral ovaries are within normal limits.  Other:  Moderate to large volume abdominopelvic ascites.  Body wall edema/anasarca.  Musculoskeletal: Renal osteodystrophy.  IMPRESSION: No evidence of bowel obstruction.  Moderate to large volume of pelvic ascites. Body wall edema. Anasarca.  Small right pleural effusion.  Ground-glass opacities in the bilateral lower lobes, incompletely visualized, possibly interstitial edema or less likely infection.   Electronically Signed   By: Charline Bills M.D.   On: 12/20/2014 08:27   US Paracentesis  12/19/2014   INDICATION: Pulmonary artery hypertension, portal hypertension, ESRD, recurrent ascites. Request is made for therapeutic paracentesis.  EXAM: ULTRASOUND-GUIDED THERAPEUTIC PARACENTESIS  COMPARISON:  Prior paracentesis on 12/05/2014  MEDICATIONS: None.  COMPLICATIONS: None immediate  TECHNIQUE: Informed written consent was obtained from the patient after a discussion of the risks, benefits and alternatives to treatment. A timeout was performed prior to the initiation of the procedure.  Initial ultrasound scanning demonstrates a large amount of ascites within the right mid to lower abdominal quadrant. The right mid to lower abdomen was prepped and draped in the usual sterile fashion. 1% lidocaine was used for local anesthesia. Under direct ultrasound guidance, a 19 gauge, 10-cm, Yueh catheter was introduced. An ultrasound image was saved for documentation purposed. The paracentesis was performed. The catheter was removed and a dressing was applied. The patient tolerated the procedure well without immediate post procedural complication.  FINDINGS: A total of approximately 6.3 liters of yellow fluid was removed.  IMPRESSION: Successful ultrasound-guided therapeutic paracentesis yielding 6.3 liters of peritoneal fluid.  Read by: Jeananne Rama, PA-C   Electronically Signed   By: Malachy Moan M.D.   On: 12/19/2014 12:11   Dg Abd Acute W/chest  12/19/2014   CLINICAL DATA:  Abdominal pain  EXAM: ACUTE ABDOMEN SERIES  (ABDOMEN 2 VIEW & CHEST 1 VIEW)  COMPARISON:  Chest x-ray 08/31/2014  FINDINGS: There is no convincing bowel obstruction. No evidence of perforation or pneumatosis. Amorphous calcification in the right upper quadrant, up to 4 cm in diameter, is chronic based on chest x-ray from 08/31/2014. The location and morphology is unusual, question hepatic or peritoneal based calcification. Relationship to acute abdominal pain is doubtful given chronicity.  Small right pleural effusion. There is cardiomegaly and mild pulmonary venous congestion. No pneumonia.  IMPRESSION: 1. No acute findings. 2. Cardiomegaly and chronic small right pleural effusion. 3. Chronic indeterminate calcification in the right upper quadrant.   Electronically Signed   By: Marnee Spring M.D.   On: 12/19/2014 21:44   Medications:   . cefTRIAXone (ROCEPHIN)  IV  2 g Intravenous Q24H  . cloNIDine  0.3 mg Oral BID  . sodium chloride  3 mL Intravenous Q12H

## 2014-12-20 NOTE — Progress Notes (Signed)
INITIAL NUTRITION ASSESSMENT  DOCUMENTATION CODES Per approved criteria  -Not Applicable   INTERVENTION: Provide 30 ml Prostat po BID, each supplement provides 100 kcal and 15 grams of protein.  Encourage adequate PO intake.  NUTRITION DIAGNOSIS: Increased nutrient needs related to chronic illness, ESRD as evidenced by estimated nutrition needs.   Goal: Pt to meet >/= 90% of their estimated nutrition needs   Monitor:  PO intake, weight trends, labs, I/O's  Reason for Assessment: MST  44 y.o. 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 Patient was found to have elevated potassium of around 7.2.  Pt reports her appetite is good currently. She reports the day PTA she did not eat anything due to a lack of appetite, however she usually eats great with multiple small meals a day consumed every 3-4 hours. Weight has been stable with usual body weight of 180 lbs. Pt reports she usually consumes Prostat on the days she goes to dialysis and reports she would like to get them while hospitalized. RD to order. Pt reports PTA she was starting to consume more fresh fruits, thus causing a high potassium. Pt was educated on fruits that were low in potassium and fine for her consumption. Pt expressed understanding. Pt was encouraged to eat her food at meals.   Pt with no observed significant fat or muscle mass loss.   Labs: Low GFR. High BUN and creatinine.  Height: Ht Readings from Last 1 Encounters:  12/20/14 5\' 8"  (1.727 m)    Weight: Wt Readings from Last 1 Encounters:  12/19/14 173 lb 1 oz (78.5 kg)    Ideal Body Weight: 140 lbs  % Ideal Body Weight: 124%  Wt Readings from Last 10 Encounters:  12/19/14 173 lb 1 oz (78.5 kg)  12/11/14 180 lb (81.647 kg)  10/16/14 178 lb (80.74 kg)  09/11/14  174 lb 2.6 oz (79 kg)  09/01/14 173 lb 8 oz (78.7 kg)  07/03/14 176 lb 3.2 oz (79.924 kg)  05/23/14 178 lb 9.2 oz (81 kg)  05/19/14 177 lb 0.5 oz (80.3 kg)  05/16/14 177 lb 0.5 oz (80.3 kg)  05/14/14 179 lb 14.3 oz (81.6 kg)    Usual Body Weight: 180 lbs  % Usual Body Weight: 96%  BMI:  Body mass index is 26.32 kg/(m^2).  Estimated Nutritional Needs: Kcal: 2000-2250 Protein: 100-115 grams Fluid: 1.2 L/day  Skin: +1 LE edema  Diet Order: Diet NPO time specified Except for: Sips with Meds  EDUCATION NEEDS: -Education needs addressed   Intake/Output Summary (Last 24 hours) at 12/20/14 0940 Last data filed at 12/20/14 0600  Gross per 24 hour  Intake      0 ml  Output    750 ml  Net   -750 ml    Last BM: 3/2  Labs:   Recent Labs Lab 12/19/14 1829 12/20/14 0344  NA 139 138  K 7.2* 4.1  CL 99 97  CO2 23 28  BUN 97* 41*  CREATININE 13.34* 7.30*  CALCIUM 8.5 8.4  GLUCOSE 87 106*    CBG (last 3)   Recent Labs  12/20/14 0429  GLUCAP 89    Scheduled Meds: . cefTRIAXone (ROCEPHIN)  IV  2 g Intravenous Q24H  . cloNIDine  0.3 mg Oral BID  . [START ON 12/21/2014] doxercalciferol  8 mcg Intravenous Q M,W,F-HD  .  ferric gluconate (FERRLECIT/NULECIT) IV  62.5 mg Intravenous Once  . sodium chloride  3 mL Intravenous Q12H    Continuous Infusions:   Past Medical History  Diagnosis Date  . 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 Hypertension   . ESRD on hemodialysis 04/20/2014    Patient started HD in 1998.  She has been dialyzed in [place] at "[facility]" until moving to [place] in July 2015 to live with family.  She has had failed accesses in the L arm.  No attempt to place access was made in the R arm, patient is not sure why.  She has a L thigh AVG which she says has been functional for 7-8 years.  They stopped giving her heparin several years ago due to prolonged access bleeding.    Marland Kitchen Heart murmur   . Anemia of  chronic disease   . History of blood transfusion     "several; w/transplant, infections, etc"  . Headache(784.0)     "often; sometimes daily" (04/26/2014)  . Migraines     "qod" (04/26/2014)  . ESRD on hemodialysis 04/20/2014    Patient started HD in 1998.  She has been dialyzed in [place] at "[facility]" until moving to [place] in July 2015 to live with family.  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  She has had failed accesses in the L arm.  No attempt to place access was made in the R arm, patient is not sure why.  She has a L thigh AVG which she says has been functional for  . Ascites   . Pulmonary hypertension     Past Surgical History  Procedure Laterality Date  . Arteriovenous graft placement Left 2013    thigh  . 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 Nephrectomy transplanted organ  2013    Marijean Niemann, MS, RD, LDN Pager # [phone] After hours/ weekend pager # [phone]

## 2014-12-21 MED ORDER — SODIUM CHLORIDE 0.9 % IV SOLN: 100.0000 mL | INTRAVENOUS | Status: DC | PRN

## 2014-12-21 MED ORDER — NEPRO/CARBSTEADY PO LIQD: 237.0000 mL | ORAL | Status: DC | PRN

## 2014-12-21 MED ORDER — HEPARIN SODIUM (PORCINE) 1000 UNIT/ML DIALYSIS
1000.0000 [IU] | INTRAMUSCULAR | Status: DC | PRN
  Filled 2014-12-21: qty 1

## 2014-12-21 MED ORDER — ALTEPLASE 2 MG IJ SOLR: 2.0000 mg | Freq: Once | INTRAMUSCULAR | Status: DC | PRN

## 2014-12-21 MED ORDER — PENTAFLUOROPROP-TETRAFLUOROETH EX AERO: 1.0000 "application " | INHALATION_SPRAY | CUTANEOUS | Status: DC | PRN

## 2014-12-21 MED ORDER — LIDOCAINE HCL (PF) 1 % IJ SOLN: 5.0000 mL | INTRAMUSCULAR | Status: DC | PRN

## 2014-12-21 MED ORDER — ALTEPLASE 2 MG IJ SOLR
2.0000 mg | Freq: Once | INTRAMUSCULAR | Status: DC | PRN
Start: 2014-12-21 — End: 2014-12-21
  Filled 2014-12-21: qty 2

## 2014-12-21 MED ORDER — LIDOCAINE-PRILOCAINE 2.5-2.5 % EX CREA: 1.0000 "application " | TOPICAL_CREAM | CUTANEOUS | Status: DC | PRN

## 2014-12-21 MED ORDER — HEPARIN SODIUM (PORCINE) 1000 UNIT/ML DIALYSIS
20.0000 [IU]/kg | Freq: Once | INTRAMUSCULAR | Status: DC
  Filled 2014-12-21: qty 2

## 2014-12-21 MED ORDER — TRAMADOL HCL 50 MG PO TABS: 50.0000 mg | ORAL_TABLET | Freq: Two times a day (BID) | ORAL | Status: DC | PRN

## 2014-12-21 MED ORDER — CIPROFLOXACIN HCL 500 MG PO TABS: 500.0000 mg | ORAL_TABLET | Freq: Every day | ORAL | Status: DC

## 2014-12-21 MED ORDER — CLONIDINE HCL 0.1 MG PO TABS: 0.1000 mg | ORAL_TABLET | Freq: Two times a day (BID) | ORAL | Status: DC

## 2014-12-21 MED ORDER — DOXERCALCIFEROL 4 MCG/2ML IV SOLN
INTRAVENOUS | Status: AC
  Filled 2014-12-21: qty 4

## 2014-12-21 MED ORDER — LIDOCAINE-PRILOCAINE 2.5-2.5 % EX CREA
1.0000 | TOPICAL_CREAM | CUTANEOUS | Status: DC | PRN
Start: 2014-12-21 — End: 2014-12-21

## 2014-12-21 MED ORDER — HEPARIN SODIUM (PORCINE) 1000 UNIT/ML DIALYSIS
100.0000 [IU]/kg | INTRAMUSCULAR | Status: DC | PRN
  Filled 2014-12-21: qty 8

## 2014-12-21 MED ORDER — HEPARIN SODIUM (PORCINE) 1000 UNIT/ML DIALYSIS: 1000.0000 [IU] | INTRAMUSCULAR | Status: DC | PRN

## 2014-12-21 NOTE — Procedures (Signed)
I was present at this session.  I have reviewed the session itself and made appropriate changes.  Bp tol hd, access press ok  Marsden Zaino L 3/4/20164:18 PM

## 2014-12-21 NOTE — Care Management Note (Signed)
CARE MANAGEMENT NOTE 12/21/2014  Patient:  Holly Mendoza, Holly Mendoza   Account Number:  [id number]  Date Initiated:  12/21/2014  Documentation initiated by:  Shreyansh Tiffany  Subjective/Objective Assessment:   CM following for progression and d/c planning.     Action/Plan:   Noted consult for hospital bed and shower chair. Also noted that pt has [place] aide and Medicaid. Pt Medicaid social worker will need to determine if she can obtain these items as this pt is not bedbound or unable to ambulate.   Anticipated DC Date:  12/23/2014   Anticipated DC Plan:  HOME/SELF CARE         Choice offered to / List presented to:             Status of service:  In process, will continue to follow Medicare Important Message given?   (If response is "NO", the following Medicare IM given date fields will be blank) Date Medicare IM given:   Medicare IM given by:   Date Additional Medicare IM given:   Additional Medicare IM given by:    Discharge Disposition:    Per UR Regulation:    If discussed at Long Length of Stay Meetings, dates discussed:    Comments:  12/21/14 Met with pt re d/c plans, pt has Medicaid and a [place] aide, therefore she has a Medicaid case manager, who needs to made decisions re this equipment requested by the pt. The Hospital med is not a medical necessity at this time, while the pt would be more comfortabel the Medicaid CM will need to determine if this pt budget for Medicaid will cover this equiptment. This was explained to the pt and she states that she will contact her Medicaid CM upon d/c. CRoyal RN MPH, case manager, [phone]

## 2014-12-21 NOTE — Progress Notes (Signed)
Patient discharged home,accompanied by family member. Holly Mendoza, Drinda Butts, Charity fundraiser

## 2014-12-21 NOTE — Discharge Summary (Signed)
Physician Discharge Summary  Holly Mendoza WUJ:[id number] DOB: [birth year] DOA: 12/19/2014  PCP: Dorrene German, MD  Admit date: 12/19/2014 Discharge date: 12/21/2014  Time spent: greater than 30 minutes  Recommendations for Outpatient Follow-up:  1. Monitor BMET  Discharge Diagnoses:  Principal Problem:   Hyperkalemia Active Problems:   ESRD on hemodialysis   Hx of ascites   HTN (hypertension)   Abdominal pain   Discharge Condition: stable  Diet recommendation: renal  Filed Weights   12/20/14 1303 12/20/14 1603 12/21/14 1422  Weight: 77.5 kg (170 lb 13.7 oz) 75.5 kg (166 lb 7.2 oz) 76.4 kg (168 lb 6.9 oz)    History of present illness:  44 y.o. 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 Patient states that she has had paracentesis today following which she started developing generalized abdominal pain. Patient also had one episode diarrhea and multiple episodes of nausea vomiting. Patient had missed her dialysis today. In the ER patient was found to have elevated potassium of around 7.2 with EKG changes. Patient was given calcium gluconate IV along with D50 and IV insulin and on-call nephrologist was consulted and patient will be transferred to [facility] for dialysis. Patient states her pain is significant and hurts to eat. Pain is generalized. Denies any fever or chills. Patient otherwise denies any shortness of breath or chest pain.   Hospital Course:  Hyperkalemia in a patient with ESRD on dialysis - corrected with dialysis  Abdominal pain with nausea vomiting and one episode of diarrhea -  Admitting physician started patient on rocephin in case SBP -CT abdomen and pelvis pending showed no obstruction, just anasarca, ascites- Dr. Benjamine Mola ordered diagnostic paracentesis but unable to do, as she had daily dialysis during hospitalization.  Patient requesting discharge, and nephrology has cleared her to go without paracentesis, thinks abdominal pain may have been related to massive fluid overload from noncompliance with dialysis (often misses or signs off early).  Pain much improved. Will give a few more days abx in case SBP No vomiting or diarrhea since admission. Tolerating solids  Recurrent ascites with cirrhosis noncompliance with dialysis contributing  Code Status: full  Consultants:  renal  Procedures: hemodylsis  Discharge Exam: Filed Vitals:   12/21/14 1730  BP: 179/119  Pulse: 105  Temp:   Resp:     General: comfortable on HD Cardiovascular: RRR Respiratory: cta Abd: obese Ext minimal edema Psych: cooperative  Discharge Instructions   Discharge Instructions    Activity as tolerated - No restrictions    Complete by:  As directed      Discharge instructions    Complete by:  As directed   Renal diet          Current Discharge Medication List    START taking these medications   Details  ciprofloxacin (CIPRO) 500 MG tablet Take 1 tablet (500 mg total) by mouth daily with breakfast. Qty: 5 tablet, Refills: 0    traMADol (ULTRAM) 50 MG tablet Take 1 tablet (50 mg total) by mouth every 12 (twelve) hours as needed for severe pain. Qty: 20 tablet, Refills: 0      CONTINUE these medications which have NOT CHANGED   Details  acetaminophen (TYLENOL) 500 MG tablet Take 500 mg by mouth every 6 (six) hours as needed for moderate pain.    amLODipine (NORVASC) 10 MG tablet Take 1 tablet (10 mg total) by mouth daily.  Qty: 30 tablet, Refills: 3    aspirin EC 81 MG tablet Take 1 tablet (81 mg total) by mouth daily. Qty: 30 tablet, Refills: 2    calcium acetate (PHOSLO) 667 MG capsule Take 667 mg by mouth 3 (three) times daily with meals.     cloNIDine (CATAPRES) 0.3 MG tablet Take 0.3 mg by mouth 2 (two) times daily.    esomeprazole (NEXIUM) 40 MG capsule Take 1 capsule (40 mg total) by mouth daily  at 12 noon. Qty: 30 capsule, Refills: 2    Skin Protectants, Misc. (EUCERIN) cream Apply 1 application topically daily as needed for dry skin.        Allergies  Allergen Reactions  . Contrast Media [Iodinated Diagnostic Agents] Anaphylaxis  . Procardia [Nifedipine] Other (See Comments)    Sweating, BP drops dangerously low,  . Vancomycin Anaphylaxis and Hives   Follow-up Information    Follow up with Dorrene German, MD.   Specialty:  Internal Medicine   Contact information:   [address] [place] [address] [phone]        The results of significant diagnostics from this hospitalization (including imaging, microbiology, ancillary and laboratory) are listed below for reference.    Significant Diagnostic Studies: Ct Abdomen Pelvis Wo Contrast  12/20/2014   CLINICAL DATA:  Right lower abdominal pain, failed renal transplant  EXAM: CT ABDOMEN AND PELVIS WITHOUT CONTRAST  TECHNIQUE: Multidetector CT imaging of the abdomen and pelvis was performed following the standard protocol without IV contrast.  COMPARISON:  Abdominal ultrasound dated 09/18/2014  FINDINGS: Lower chest:  Small right pleural effusion.  Mild ground-glass opacities in the bilateral lower lobes, possibly interstitial edema or less likely infection.  Hepatobiliary: Liver is grossly unremarkable.  Gallbladder is notable for layering sludge. No intrahepatic or extrahepatic ductal dilatation.  Pancreas: Within normal limits.  Spleen: Suspected prior splenic infarct (series 201/ image 25).  Adrenals/Urinary Tract: Adrenal glands are unremarkable.  Renal atrophy with multiple bilateral renal cysts, including a dominant 4.5 cm lateral right upper pole cyst (series 21/ image 39). No hydronephrosis.  Stomach/Bowel: Stomach is within normal limits.  Visualized bowel is unremarkable.  No evidence of bowel obstruction.  Vascular/Lymphatic: Atherosclerotic calcifications of the abdominal aorta and branch vessels.  No suspicious  abdominopelvic lymphadenopathy.  Reproductive: Uterus is unremarkable.  Bilateral ovaries are within normal limits.  Other: Moderate to large volume abdominopelvic ascites.  Body wall edema/anasarca.  Musculoskeletal: Renal osteodystrophy.  IMPRESSION: No evidence of bowel obstruction.  Moderate to large volume of pelvic ascites. Body wall edema. Anasarca.  Small right pleural effusion.  Ground-glass opacities in the bilateral lower lobes, incompletely visualized, possibly interstitial edema or less likely infection.   Electronically Signed   By: Charline Bills M.D.   On: 12/20/2014 08:27   Dg Abd Acute W/chest  12/19/2014   CLINICAL DATA:  Abdominal pain  EXAM: ACUTE ABDOMEN SERIES (ABDOMEN 2 VIEW & CHEST 1 VIEW)  COMPARISON:  Chest x-ray 08/31/2014  FINDINGS: There is no convincing bowel obstruction. No evidence of perforation or pneumatosis. Amorphous calcification in the right upper quadrant, up to 4 cm in diameter, is chronic based on chest x-ray from 08/31/2014. The location and morphology is unusual, question hepatic or peritoneal based calcification. Relationship to acute abdominal pain is doubtful given chronicity.  Small right pleural effusion. There is cardiomegaly and mild pulmonary venous congestion. No pneumonia.  IMPRESSION: 1. No acute findings. 2. Cardiomegaly and chronic small right pleural effusion. 3. Chronic indeterminate calcification in the  right upper quadrant.   Electronically Signed   By: Marnee Spring M.D.   On: 12/19/2014 21:44    Microbiology: Recent Results (from the past 240 hour(s))  MRSA PCR Screening     Status: None   Collection Time: 12/20/14  1:47 AM  Result Value Ref Range Status   MRSA by PCR NEGATIVE NEGATIVE Final    Comment:        The GeneXpert MRSA Assay (FDA approved for NASAL specimens only), is one component of a comprehensive MRSA colonization surveillance program. It is not intended to diagnose MRSA infection nor to guide or monitor treatment  for MRSA infections.      Labs: Basic Metabolic Panel:  Recent Labs Lab 12/19/14 1829 12/20/14 0344  NA 139 138  K 7.2* 4.1  CL 99 97  CO2 23 28  GLUCOSE 87 106*  BUN 97* 41*  CREATININE 13.34* 7.30*  CALCIUM 8.5 8.4   Liver Function Tests:  Recent Labs Lab 12/19/14 1829 12/20/14 0344  AST 14 18  ALT 17 17  ALKPHOS 95 102  BILITOT 0.5 0.5  PROT 5.5* 5.5*  ALBUMIN 2.2* 2.0*    Recent Labs Lab 12/20/14 0344  LIPASE 23    Recent Labs Lab 12/19/14 1829  AMMONIA 25   CBC:  Recent Labs Lab 12/19/14 1829 12/20/14 0344  WBC 10.1 12.3*  NEUTROABS 8.5* 10.7*  HGB 11.4* 9.8*  HCT 36.6 30.9*  MCV 90.6 87.5  PLT 207 211   Cardiac Enzymes: No results for input(s): CKTOTAL, CKMB, CKMBINDEX, TROPONINI in the last 168 hours. BNP: BNP (last 3 results) No results for input(s): BNP in the last 8760 hours.  ProBNP (last 3 results)  Recent Labs  08/31/14 1806  PROBNP 46663.0*    CBG:  Recent Labs Lab 12/20/14 0429  GLUCAP 89       Signed:  Amrit Erck L  [facility] 12/21/2014, 5:58 PM

## 2014-12-21 NOTE — Progress Notes (Signed)
Subjective:  Feels better, co constipation. Claims she ate ^ K food and SHE Scheduled Paracentesis herself on HD day .Wants to dialyze and go home  Objective Vital signs in last 24 hours: Filed Vitals:   12/20/14 1707 12/20/14 2000 12/21/14 0501 12/21/14 0811  BP: 134/80 135/73 120/87 126/74  Pulse: 112 115 100 102  Temp: 98.3 F (36.8 C) 100.9 F (38.3 C) 99.1 F (37.3 C) 98.6 F (37 C)  TempSrc: Oral Oral Oral Oral  Resp: 17 16 17 18   Height:      Weight:      SpO2: 100% 98% 65% 94%   Weight change: -1 kg (-2 lb 3.3 oz) Physical Exam General: Alert and oriented. NAD Heart: RRR 2/6  Mur, no rub Lungs: Faint Rale in BASE/  Unlabored. Abdomen: soft, mild generalized tenderness. Mild Ascites/ Umbil hern.  Extremities: +2 LE edema Dialysis Access: L thigh AVG +bruit   Assessment/Plan: 1. Hyperkalemia- resolved with HD. With  noncompliance 2. Abdominal pain- improved this AM. S/p paracentesis on admit . Rocephin started. CT- no evidence of bowel obstruction 2. ESRD - MWF [place], hd  today. Vol xs, if adequately dialyzed , would probably not need paracentesis. Bp ^ c/w vol xs also 3. Anemia - hgb 9.8, no ESA watch CBC, cont weekly Fe.if hgb low today needs restart ESA  4. Secondary hyperparathyroidism - Ca+ 10 corrected Cont hectorol and phoslo when diet adcanced 5. HTN/volume - 126/74/ needs lower edw Amlodipine and clonidine- home meds,. intersitial  Effusion/edema/  6. Nutrition - NPO 7. DM- per primary 8 Nonadhernce a pattern. Needs adequate vol off. / DW PT scheduling non hd day paracentesis    Lenny Pastel, PA-C [facility] [phone] 12/21/2014,9:01 AM  LOS: 2 days   Labs: Basic Metabolic Panel:  Recent Labs Lab 12/19/14 1829 12/20/14 0344  NA 139 138  K 7.2* 4.1  CL 99 97  CO2 23 28  GLUCOSE 87 106*  BUN 97* 41*  CREATININE 13.34* 7.30*  CALCIUM 8.5 8.4   Liver Function Tests:  Recent Labs Lab 12/19/14 1829 12/20/14 0344   AST 14 18  ALT 17 17  ALKPHOS 95 102  BILITOT 0.5 0.5  PROT 5.5* 5.5*  ALBUMIN 2.2* 2.0*    Recent Labs Lab 12/20/14 0344  LIPASE 23    Recent Labs Lab 12/19/14 1829  AMMONIA 25   CBC:  Recent Labs Lab 12/19/14 1829 12/20/14 0344  WBC 10.1 12.3*  NEUTROABS 8.5* 10.7*  HGB 11.4* 9.8*  HCT 36.6 30.9*  MCV 90.6 87.5  PLT 207 211   Cardiac Enzymes: No results for input(s): CKTOTAL, CKMB, CKMBINDEX, TROPONINI in the last 168 hours. CBG:  Recent Labs Lab 12/20/14 0429  GLUCAP 89    Studies/Results: Ct Abdomen Pelvis Wo Contrast  12/20/2014   CLINICAL DATA:  Right lower abdominal pain, failed renal transplant  EXAM: CT ABDOMEN AND PELVIS WITHOUT CONTRAST  TECHNIQUE: Multidetector CT imaging of the abdomen and pelvis was performed following the standard protocol without IV contrast.  COMPARISON:  Abdominal ultrasound dated 09/18/2014  FINDINGS: Lower chest:  Small right pleural effusion.  Mild ground-glass opacities in the bilateral lower lobes, possibly interstitial edema or less likely infection.  Hepatobiliary: Liver is grossly unremarkable.  Gallbladder is notable for layering sludge. No intrahepatic or extrahepatic ductal dilatation.  Pancreas: Within normal limits.  Spleen: Suspected prior splenic infarct (series 201/ image 25).  Adrenals/Urinary Tract: Adrenal glands are unremarkable.  Renal atrophy with multiple bilateral renal  cysts, including a dominant 4.5 cm lateral right upper pole cyst (series 21/ image 39). No hydronephrosis.  Stomach/Bowel: Stomach is within normal limits.  Visualized bowel is unremarkable.  No evidence of bowel obstruction.  Vascular/Lymphatic: Atherosclerotic calcifications of the abdominal aorta and branch vessels.  No suspicious abdominopelvic lymphadenopathy.  Reproductive: Uterus is unremarkable.  Bilateral ovaries are within normal limits.  Other: Moderate to large volume abdominopelvic ascites.  Body wall edema/anasarca.  Musculoskeletal:  Renal osteodystrophy.  IMPRESSION: No evidence of bowel obstruction.  Moderate to large volume of pelvic ascites. Body wall edema. Anasarca.  Small right pleural effusion.  Ground-glass opacities in the bilateral lower lobes, incompletely visualized, possibly interstitial edema or less likely infection.   Electronically Signed   By: Charline Bills M.D.   On: 12/20/2014 08:27   US Paracentesis  12/19/2014   INDICATION: Pulmonary artery hypertension, portal hypertension, ESRD, recurrent ascites. Request is made for therapeutic paracentesis.  EXAM: ULTRASOUND-GUIDED THERAPEUTIC PARACENTESIS  COMPARISON:  Prior paracentesis on 12/05/2014  MEDICATIONS: None.  COMPLICATIONS: None immediate  TECHNIQUE: Informed written consent was obtained from the patient after a discussion of the risks, benefits and alternatives to treatment. A timeout was performed prior to the initiation of the procedure.  Initial ultrasound scanning demonstrates a large amount of ascites within the right mid to lower abdominal quadrant. The right mid to lower abdomen was prepped and draped in the usual sterile fashion. 1% lidocaine was used for local anesthesia. Under direct ultrasound guidance, a 19 gauge, 10-cm, Yueh catheter was introduced. An ultrasound image was saved for documentation purposed. The paracentesis was performed. The catheter was removed and a dressing was applied. The patient tolerated the procedure well without immediate post procedural complication.  FINDINGS: A total of approximately 6.3 liters of yellow fluid was removed.  IMPRESSION: Successful ultrasound-guided therapeutic paracentesis yielding 6.3 liters of peritoneal fluid.  Read by: Jeananne Rama, PA-C   Electronically Signed   By: Malachy Moan M.D.   On: 12/19/2014 12:11   Dg Abd Acute W/chest  12/19/2014   CLINICAL DATA:  Abdominal pain  EXAM: ACUTE ABDOMEN SERIES (ABDOMEN 2 VIEW & CHEST 1 VIEW)  COMPARISON:  Chest x-ray 08/31/2014  FINDINGS: There is no  convincing bowel obstruction. No evidence of perforation or pneumatosis. Amorphous calcification in the right upper quadrant, up to 4 cm in diameter, is chronic based on chest x-ray from 08/31/2014. The location and morphology is unusual, question hepatic or peritoneal based calcification. Relationship to acute abdominal pain is doubtful given chronicity.  Small right pleural effusion. There is cardiomegaly and mild pulmonary venous congestion. No pneumonia.  IMPRESSION: 1. No acute findings. 2. Cardiomegaly and chronic small right pleural effusion. 3. Chronic indeterminate calcification in the right upper quadrant.   Electronically Signed   By: Marnee Spring M.D.   On: 12/19/2014 21:44   Medications:   . amLODipine  10 mg Oral Daily  . calcium acetate  667 mg Oral TID WC  . cefTRIAXone (ROCEPHIN)  IV  2 g Intravenous Q24H  . cloNIDine  0.3 mg Oral BID  . doxercalciferol  8 mcg Intravenous Q M,W,F-HD  . feeding supplement (PRO-STAT SUGAR FREE 64)  30 mL Oral BID  . multivitamin  1 tablet Oral QHS  . sodium chloride  3 mL Intravenous Q12H

## 2014-12-21 NOTE — Progress Notes (Signed)
Patient Discharge:  Disposition: Pt to be discharge home with family  Education: Pt educated on medications, follow up appointments, and all discharge instructions. Pt verbalized understanding.  IV: Removed  Telemetry: Removed CCMD notified  Follow-up appointments: Reviewed with pt  Prescriptions: Scripts given to pt

## 2015-01-03 ENCOUNTER — Other Ambulatory Visit (INDEPENDENT_AMBULATORY_CARE_PROVIDER_SITE_OTHER): Payer: Medicare Other

## 2015-01-03 DIAGNOSIS — K7460 Unspecified cirrhosis of liver: Secondary | ICD-10-CM

## 2015-01-03 DIAGNOSIS — R188 Other ascites: Secondary | ICD-10-CM | POA: Diagnosis not present

## 2015-01-03 LAB — CBC WITH DIFFERENTIAL/PLATELET
BASOS PCT: 0.3 % (ref 0.0–3.0)
Basophils Absolute: 0 10*3/uL (ref 0.0–0.1)
Eosinophils Absolute: 0.2 10*3/uL (ref 0.0–0.7)
Eosinophils Relative: 2.2 % (ref 0.0–5.0)
HCT: 27 % — ABNORMAL LOW (ref 36.0–46.0)
Lymphocytes Relative: 11 % — ABNORMAL LOW (ref 12.0–46.0)
Lymphs Abs: 1 10*3/uL (ref 0.7–4.0)
MCHC: 32.9 g/dL (ref 30.0–36.0)
MCV: 87.2 fl (ref 78.0–100.0)
Monocytes Absolute: 0.7 10*3/uL (ref 0.1–1.0)
Monocytes Relative: 8.1 % (ref 3.0–12.0)
Neutro Abs: 6.8 10*3/uL (ref 1.4–7.7)
Neutrophils Relative %: 78.4 % — ABNORMAL HIGH (ref 43.0–77.0)
PLATELETS: 257 10*3/uL (ref 150.0–400.0)
RBC: 3.09 Mil/uL — AB (ref 3.87–5.11)
RDW: 15.7 % — AB (ref 11.5–15.5)
WBC: 8.7 10*3/uL (ref 4.0–10.5)

## 2015-01-03 LAB — PROTIME-INR
INR: 1.1 ratio — ABNORMAL HIGH (ref 0.8–1.0)
Prothrombin Time: 12 s (ref 9.6–13.1)

## 2015-01-03 LAB — COMPREHENSIVE METABOLIC PANEL
ALBUMIN: 2.4 g/dL — AB (ref 3.5–5.2)
ALT: 14 U/L (ref 0–35)
AST: 14 U/L (ref 0–37)
Alkaline Phosphatase: 166 U/L — ABNORMAL HIGH (ref 39–117)
BILIRUBIN TOTAL: 0.3 mg/dL (ref 0.2–1.2)
BUN: 98 mg/dL (ref 6–23)
CALCIUM: 8.7 mg/dL (ref 8.4–10.5)
CO2: 27 meq/L (ref 19–32)
Chloride: 94 mEq/L — ABNORMAL LOW (ref 96–112)
Creatinine, Ser: 12.36 mg/dL (ref 0.40–1.20)
GFR: 4.26 mL/min — CL (ref >60.00)
GLUCOSE: 89 mg/dL (ref 70–99)
POTASSIUM: 5.4 meq/L — AB (ref 3.5–5.1)
SODIUM: 132 meq/L — AB (ref 135–145)
TOTAL PROTEIN: 5.5 g/dL — AB (ref 6.0–8.3)

## 2015-01-04 LAB — IGG: IGG (IMMUNOGLOBIN G), SERUM: 1120 mg/dL (ref 690–1700)

## 2015-01-04 LAB — ANA: ANA: NEGATIVE

## 2015-01-08 ENCOUNTER — Encounter (HOSPITAL_COMMUNITY): Payer: Self-pay | Admitting: *Deleted

## 2015-01-08 ENCOUNTER — Ambulatory Visit (HOSPITAL_COMMUNITY): Payer: Medicare Other | Admitting: Anesthesiology

## 2015-01-08 ENCOUNTER — Encounter (HOSPITAL_COMMUNITY)
Admission: AD | Disposition: A | Discharge status: Home / Self Care | Payer: Self-pay | Source: Ambulatory Visit | Attending: Vascular Surgery

## 2015-01-08 ENCOUNTER — Other Ambulatory Visit: Payer: Self-pay

## 2015-01-08 ENCOUNTER — Inpatient Hospital Stay (HOSPITAL_COMMUNITY)
Admission: AD | Admit: 2015-01-08 | Discharge: 2015-01-09 | DRG: 252 | Disposition: A | Discharge status: Home / Self Care | Payer: Medicare Other | Source: Ambulatory Visit | Attending: Vascular Surgery | Admitting: Vascular Surgery

## 2015-01-08 DIAGNOSIS — N186 End stage renal disease: Secondary | ICD-10-CM | POA: Diagnosis present

## 2015-01-08 DIAGNOSIS — Z888 Allergy status to other drugs, medicaments and biological substances status: Secondary | ICD-10-CM | POA: Diagnosis not present

## 2015-01-08 DIAGNOSIS — Z881 Allergy status to other antibiotic agents status: Secondary | ICD-10-CM | POA: Diagnosis not present

## 2015-01-08 DIAGNOSIS — Z992 Dependence on renal dialysis: Secondary | ICD-10-CM

## 2015-01-08 DIAGNOSIS — Z91041 Radiographic dye allergy status: Secondary | ICD-10-CM

## 2015-01-08 DIAGNOSIS — I272 Other secondary pulmonary hypertension: Secondary | ICD-10-CM | POA: Diagnosis present

## 2015-01-08 DIAGNOSIS — I120 Hypertensive chronic kidney disease with stage 5 chronic kidney disease or end stage renal disease: Secondary | ICD-10-CM | POA: Diagnosis present

## 2015-01-08 DIAGNOSIS — D631 Anemia in chronic kidney disease: Secondary | ICD-10-CM | POA: Diagnosis present

## 2015-01-08 DIAGNOSIS — Y838 Other surgical procedures as the cause of abnormal reaction of the patient, or of later complication, without mention of misadventure at the time of the procedure: Secondary | ICD-10-CM | POA: Diagnosis present

## 2015-01-08 DIAGNOSIS — G43909 Migraine, unspecified, not intractable, without status migrainosus: Secondary | ICD-10-CM | POA: Diagnosis present

## 2015-01-08 DIAGNOSIS — R011 Cardiac murmur, unspecified: Secondary | ICD-10-CM | POA: Diagnosis present

## 2015-01-08 DIAGNOSIS — T82868A Thrombosis of vascular prosthetic devices, implants and grafts, initial encounter: Principal | ICD-10-CM | POA: Diagnosis present

## 2015-01-08 HISTORY — PX: THROMBECTOMY AND REVISION OF ARTERIOVENTOUS (AV) GORETEX  GRAFT: SHX6120

## 2015-01-08 LAB — POCT I-STAT 4, (NA,K, GLUC, HGB,HCT)
GLUCOSE: 127 mg/dL — AB (ref 70–99)
HEMATOCRIT: 33 % — AB (ref 36.0–46.0)
HEMOGLOBIN: 11.2 g/dL — AB (ref 12.0–15.0)
Potassium: 5.6 mmol/L — ABNORMAL HIGH (ref 3.5–5.1)
Sodium: 132 mmol/L — ABNORMAL LOW (ref 135–145)

## 2015-01-08 LAB — COMPREHENSIVE METABOLIC PANEL
ALT: 13 U/L (ref 0–35)
ANION GAP: 15 (ref 5–15)
AST: 15 U/L (ref 0–37)
Albumin: 1.6 g/dL — ABNORMAL LOW (ref 3.5–5.2)
Alkaline Phosphatase: 157 U/L — ABNORMAL HIGH (ref 39–117)
BUN: 111 mg/dL — ABNORMAL HIGH (ref 6–23)
CHLORIDE: 98 mmol/L (ref 96–112)
CO2: 22 mmol/L (ref 19–32)
Calcium: 8 mg/dL — ABNORMAL LOW (ref 8.4–10.5)
Creatinine, Ser: 14.7 mg/dL — ABNORMAL HIGH (ref 0.50–1.10)
GFR calc non Af Amer: 3 mL/min — ABNORMAL LOW (ref >90)
GFR, EST AFRICAN AMERICAN: 3 mL/min — AB (ref >90)
GLUCOSE: 159 mg/dL — AB (ref 70–99)
Potassium: 5.8 mmol/L — ABNORMAL HIGH (ref 3.5–5.1)
SODIUM: 135 mmol/L (ref 135–145)
Total Bilirubin: 0.5 mg/dL (ref 0.3–1.2)
Total Protein: 5.2 g/dL — ABNORMAL LOW (ref 6.0–8.3)

## 2015-01-08 LAB — CBC
HCT: 29.1 % — ABNORMAL LOW (ref 36.0–46.0)
HEMOGLOBIN: 9.5 g/dL — AB (ref 12.0–15.0)
MCH: 28.1 pg (ref 26.0–34.0)
MCHC: 32.6 g/dL (ref 30.0–36.0)
MCV: 86.1 fL (ref 78.0–100.0)
Platelets: 291 10*3/uL (ref 150–400)
RBC: 3.38 MIL/uL — ABNORMAL LOW (ref 3.87–5.11)
RDW: 14.2 % (ref 11.5–15.5)
WBC: 12.5 10*3/uL — ABNORMAL HIGH (ref 4.0–10.5)

## 2015-01-08 LAB — PROTIME-INR
INR: 1.2 (ref 0.00–1.49)
Prothrombin Time: 15.3 seconds — ABNORMAL HIGH (ref 11.6–15.2)

## 2015-01-08 SURGERY — THROMBECTOMY AND REVISION OF ARTERIOVENTOUS (AV) GORETEX  GRAFT
Anesthesia: General | Site: Leg Upper

## 2015-01-08 MED ORDER — SODIUM CHLORIDE 0.9 % IV SOLN
INTRAVENOUS | Status: DC
  Administered 2015-01-08: 14:00:00 via INTRAVENOUS

## 2015-01-08 MED ORDER — FENTANYL CITRATE 0.05 MG/ML IJ SOLN
INTRAMUSCULAR | Status: AC
  Filled 2015-01-08: qty 5

## 2015-01-08 MED ORDER — NEOSTIGMINE METHYLSULFATE 10 MG/10ML IV SOLN
INTRAVENOUS | Status: DC | PRN
  Administered 2015-01-08: 3 mg via INTRAVENOUS

## 2015-01-08 MED ORDER — GUAIFENESIN-DM 100-10 MG/5ML PO SYRP: 15.0000 mL | ORAL_SOLUTION | ORAL | Status: DC | PRN

## 2015-01-08 MED ORDER — STERILE WATER FOR INJECTION IJ SOLN
INTRAMUSCULAR | Status: AC
  Filled 2015-01-08: qty 10

## 2015-01-08 MED ORDER — NEOSTIGMINE METHYLSULFATE 10 MG/10ML IV SOLN
INTRAVENOUS | Status: AC
  Filled 2015-01-08: qty 3

## 2015-01-08 MED ORDER — ONDANSETRON HCL 4 MG/2ML IJ SOLN: 4.0000 mg | Freq: Four times a day (QID) | INTRAMUSCULAR | Status: DC | PRN

## 2015-01-08 MED ORDER — DEXTROSE 5 % IV SOLN: 1.5000 g | Freq: Two times a day (BID) | INTRAVENOUS | Status: DC

## 2015-01-08 MED ORDER — CHLORHEXIDINE GLUCONATE CLOTH 2 % EX PADS: 6.0000 | MEDICATED_PAD | Freq: Once | CUTANEOUS | Status: DC

## 2015-01-08 MED ORDER — LIDOCAINE HCL (CARDIAC) 20 MG/ML IV SOLN
INTRAVENOUS | Status: AC
  Filled 2015-01-08: qty 15

## 2015-01-08 MED ORDER — HEPARIN SODIUM (PORCINE) 1000 UNIT/ML IJ SOLN
INTRAMUSCULAR | Status: AC
  Filled 2015-01-08: qty 1

## 2015-01-08 MED ORDER — 0.9 % SODIUM CHLORIDE (POUR BTL) OPTIME
TOPICAL | Status: DC | PRN
  Administered 2015-01-08: 1000 mL

## 2015-01-08 MED ORDER — GLYCOPYRROLATE 0.2 MG/ML IJ SOLN
INTRAMUSCULAR | Status: DC | PRN
  Administered 2015-01-08: 0.4 mg via INTRAVENOUS

## 2015-01-08 MED ORDER — ASPIRIN EC 81 MG PO TBEC
81.0000 mg | DELAYED_RELEASE_TABLET | Freq: Every day | ORAL | Status: DC
  Administered 2015-01-08 – 2015-01-09 (×2): 81 mg via ORAL
  Filled 2015-01-08 (×2): qty 1

## 2015-01-08 MED ORDER — GLYCOPYRROLATE 0.2 MG/ML IJ SOLN
INTRAMUSCULAR | Status: AC
  Filled 2015-01-08: qty 2

## 2015-01-08 MED ORDER — ESMOLOL HCL 10 MG/ML IV SOLN
INTRAVENOUS | Status: AC
  Filled 2015-01-08: qty 10

## 2015-01-08 MED ORDER — PHENYLEPHRINE HCL 10 MG/ML IJ SOLN
INTRAMUSCULAR | Status: DC | PRN
  Administered 2015-01-08 (×2): 80 ug via INTRAVENOUS
  Administered 2015-01-08: 40 ug via INTRAVENOUS
  Administered 2015-01-08 (×2): 80 ug via INTRAVENOUS

## 2015-01-08 MED ORDER — MIDAZOLAM HCL 2 MG/2ML IJ SOLN
INTRAMUSCULAR | Status: AC
  Filled 2015-01-08: qty 2

## 2015-01-08 MED ORDER — PROPRANOLOL HCL 1 MG/ML IV SOLN
INTRAVENOUS | Status: AC
  Filled 2015-01-08: qty 1

## 2015-01-08 MED ORDER — HYDROMORPHONE HCL 1 MG/ML IJ SOLN: 0.2500 mg | INTRAMUSCULAR | Status: DC | PRN

## 2015-01-08 MED ORDER — THROMBIN 20000 UNITS EX SOLR
CUTANEOUS | Status: DC | PRN
  Administered 2015-01-08: 20 mL via TOPICAL

## 2015-01-08 MED ORDER — METOPROLOL TARTRATE 1 MG/ML IV SOLN
2.0000 mg | INTRAVENOUS | Status: DC | PRN
  Administered 2015-01-09: 5 mg via INTRAVENOUS

## 2015-01-08 MED ORDER — HYDROMORPHONE HCL 1 MG/ML IJ SOLN
INTRAMUSCULAR | Status: AC
  Filled 2015-01-08: qty 1

## 2015-01-08 MED ORDER — PROPOFOL 10 MG/ML IV BOLUS
INTRAVENOUS | Status: AC
  Filled 2015-01-08: qty 20

## 2015-01-08 MED ORDER — DOCUSATE SODIUM 100 MG PO CAPS
100.0000 mg | ORAL_CAPSULE | Freq: Two times a day (BID) | ORAL | Status: DC
  Administered 2015-01-08 – 2015-01-09 (×2): 100 mg via ORAL
  Filled 2015-01-08 (×3): qty 1

## 2015-01-08 MED ORDER — MIDAZOLAM HCL 5 MG/5ML IJ SOLN
INTRAMUSCULAR | Status: DC | PRN
  Administered 2015-01-08: 2 mg via INTRAVENOUS

## 2015-01-08 MED ORDER — HEPARIN SODIUM (PORCINE) 1000 UNIT/ML IJ SOLN
INTRAMUSCULAR | Status: DC | PRN
  Administered 2015-01-08: 5000 [IU] via INTRAVENOUS

## 2015-01-08 MED ORDER — ROCURONIUM BROMIDE 100 MG/10ML IV SOLN
INTRAVENOUS | Status: DC | PRN
  Administered 2015-01-08: 30 mg via INTRAVENOUS

## 2015-01-08 MED ORDER — ESMOLOL HCL 10 MG/ML IV SOLN
INTRAVENOUS | Status: DC | PRN
  Administered 2015-01-08 (×4): 20 mg via INTRAVENOUS

## 2015-01-08 MED ORDER — SODIUM CHLORIDE 0.9 % IV SOLN
INTRAVENOUS | Status: DC | PRN
  Administered 2015-01-08: 14:00:00 via INTRAVENOUS

## 2015-01-08 MED ORDER — CLONIDINE HCL 0.3 MG PO TABS
0.3000 mg | ORAL_TABLET | Freq: Two times a day (BID) | ORAL | Status: DC
  Administered 2015-01-08: 0.3 mg via ORAL
  Filled 2015-01-08 (×3): qty 1

## 2015-01-08 MED ORDER — HYDRALAZINE HCL 20 MG/ML IJ SOLN
INTRAMUSCULAR | Status: AC
  Filled 2015-01-08: qty 1

## 2015-01-08 MED ORDER — OXYCODONE HCL 5 MG PO TABS
5.0000 mg | ORAL_TABLET | ORAL | Status: DC | PRN
  Administered 2015-01-09: 10 mg via ORAL
  Filled 2015-01-08: qty 2

## 2015-01-08 MED ORDER — ACETAMINOPHEN 500 MG PO TABS: 500.0000 mg | ORAL_TABLET | Freq: Four times a day (QID) | ORAL | Status: DC | PRN

## 2015-01-08 MED ORDER — HYDRALAZINE HCL 20 MG/ML IJ SOLN: 5.0000 mg | INTRAMUSCULAR | Status: DC | PRN

## 2015-01-08 MED ORDER — SODIUM CHLORIDE 0.9 % IV SOLN
INTRAVENOUS | Status: DC | PRN
  Administered 2015-01-08 (×2): via INTRAVENOUS

## 2015-01-08 MED ORDER — PROTAMINE SULFATE 10 MG/ML IV SOLN
INTRAVENOUS | Status: AC
  Filled 2015-01-08: qty 5

## 2015-01-08 MED ORDER — BISACODYL 10 MG RE SUPP: 10.0000 mg | Freq: Every day | RECTAL | Status: DC | PRN

## 2015-01-08 MED ORDER — FENTANYL CITRATE 0.05 MG/ML IJ SOLN
INTRAMUSCULAR | Status: DC | PRN
  Administered 2015-01-08: 50 ug via INTRAVENOUS
  Administered 2015-01-08: 100 ug via INTRAVENOUS
  Administered 2015-01-08: 50 ug via INTRAVENOUS
  Administered 2015-01-08: 100 ug via INTRAVENOUS
  Administered 2015-01-08: 50 ug via INTRAVENOUS
  Administered 2015-01-08: 25 ug via INTRAVENOUS

## 2015-01-08 MED ORDER — NEOSTIGMINE METHYLSULFATE 10 MG/10ML IV SOLN
INTRAVENOUS | Status: AC
  Filled 2015-01-08: qty 1

## 2015-01-08 MED ORDER — POTASSIUM CHLORIDE CRYS ER 20 MEQ PO TBCR: 20.0000 meq | EXTENDED_RELEASE_TABLET | Freq: Once | ORAL | Status: DC

## 2015-01-08 MED ORDER — ONDANSETRON HCL 4 MG/2ML IJ SOLN
INTRAMUSCULAR | Status: AC
  Filled 2015-01-08: qty 2

## 2015-01-08 MED ORDER — TRAMADOL HCL 50 MG PO TABS: 50.0000 mg | ORAL_TABLET | Freq: Two times a day (BID) | ORAL | Status: DC | PRN

## 2015-01-08 MED ORDER — HYDRALAZINE HCL 20 MG/ML IJ SOLN
INTRAMUSCULAR | Status: DC | PRN
  Administered 2015-01-08: 5 mg via INTRAVENOUS

## 2015-01-08 MED ORDER — ONDANSETRON HCL 4 MG/2ML IJ SOLN: 4.0000 mg | Freq: Once | INTRAMUSCULAR | Status: DC | PRN

## 2015-01-08 MED ORDER — PROPOFOL 10 MG/ML IV BOLUS
INTRAVENOUS | Status: DC | PRN
  Administered 2015-01-08: 140 mg via INTRAVENOUS

## 2015-01-08 MED ORDER — HEPARIN SODIUM (PORCINE) 5000 UNIT/ML IJ SOLN
INTRAMUSCULAR | Status: DC | PRN
  Administered 2015-01-08: 500 mL

## 2015-01-08 MED ORDER — CLONIDINE HCL 0.3 MG PO TABS
0.3000 mg | ORAL_TABLET | ORAL | Status: AC
  Administered 2015-01-08: 0.3 mg via ORAL
  Filled 2015-01-08: qty 1

## 2015-01-08 MED ORDER — HYDROMORPHONE HCL 1 MG/ML IJ SOLN
0.5000 mg | INTRAMUSCULAR | Status: DC | PRN
  Administered 2015-01-08 (×2): 0.5 mg via INTRAVENOUS

## 2015-01-08 MED ORDER — PROTAMINE SULFATE 10 MG/ML IV SOLN
INTRAVENOUS | Status: DC | PRN
  Administered 2015-01-08 (×3): 10 mg via INTRAVENOUS

## 2015-01-08 MED ORDER — LABETALOL HCL 5 MG/ML IV SOLN
10.0000 mg | INTRAVENOUS | Status: DC | PRN
  Filled 2015-01-08: qty 4

## 2015-01-08 MED ORDER — LIDOCAINE HCL (CARDIAC) 20 MG/ML IV SOLN
INTRAVENOUS | Status: DC | PRN
  Administered 2015-01-08: 40 mg via INTRAVENOUS

## 2015-01-08 MED ORDER — DEXTROSE 5 % IV SOLN
1.5000 g | INTRAVENOUS | Status: AC
  Administered 2015-01-08: 1.5 g via INTRAVENOUS
  Filled 2015-01-08: qty 1.5

## 2015-01-08 MED ORDER — THROMBIN 20000 UNITS EX SOLR
CUTANEOUS | Status: AC
  Filled 2015-01-08: qty 20000

## 2015-01-08 MED ORDER — PHENOL 1.4 % MT LIQD
1.0000 | OROMUCOSAL | Status: DC | PRN
  Filled 2015-01-08: qty 177

## 2015-01-08 MED ORDER — ONDANSETRON HCL 4 MG/2ML IJ SOLN
INTRAMUSCULAR | Status: DC | PRN
  Administered 2015-01-08: 4 mg via INTRAVENOUS

## 2015-01-08 MED ORDER — SENNOSIDES-DOCUSATE SODIUM 8.6-50 MG PO TABS
1.0000 | ORAL_TABLET | Freq: Every evening | ORAL | Status: DC | PRN
  Filled 2015-01-08: qty 1

## 2015-01-08 MED ORDER — PROPRANOLOL HCL 1 MG/ML IV SOLN
INTRAVENOUS | Status: DC | PRN
  Administered 2015-01-08 (×2): .25 mg via INTRAVENOUS

## 2015-01-08 MED ORDER — AMLODIPINE BESYLATE 10 MG PO TABS
10.0000 mg | ORAL_TABLET | Freq: Every day | ORAL | Status: DC
  Filled 2015-01-08 (×2): qty 1

## 2015-01-08 MED ORDER — LIDOCAINE-EPINEPHRINE (PF) 1 %-1:200000 IJ SOLN
INTRAMUSCULAR | Status: AC
  Filled 2015-01-08: qty 20

## 2015-01-08 MED ORDER — SODIUM CHLORIDE 0.9 % IV SOLN
INTRAVENOUS | Status: AC
  Administered 2015-01-08: 21:00:00 via INTRAVENOUS

## 2015-01-08 MED ORDER — PANTOPRAZOLE SODIUM 40 MG PO TBEC
80.0000 mg | DELAYED_RELEASE_TABLET | Freq: Every day | ORAL | Status: DC
  Administered 2015-01-09: 80 mg via ORAL

## 2015-01-08 MED ORDER — ROCURONIUM BROMIDE 50 MG/5ML IV SOLN
INTRAVENOUS | Status: AC
  Filled 2015-01-08: qty 1

## 2015-01-08 MED ORDER — MORPHINE SULFATE 2 MG/ML IJ SOLN: 2.0000 mg | INTRAMUSCULAR | Status: DC | PRN

## 2015-01-08 MED ORDER — PHENYLEPHRINE HCL 10 MG/ML IJ SOLN
10.0000 mg | INTRAVENOUS | Status: DC | PRN
  Administered 2015-01-08: 20 ug/min via INTRAVENOUS

## 2015-01-08 SURGICAL SUPPLY — 62 items
ARMBAND PINK RESTRICT EXTREMIT (MISCELLANEOUS) ×4 IMPLANT
BAG DECANTER FOR FLEXI CONT (MISCELLANEOUS) IMPLANT
BIOPATCH RED 1 DISK 7.0 (GAUZE/BANDAGES/DRESSINGS) ×3 IMPLANT
BIOPATCH RED 1IN DISK 7.0MM (GAUZE/BANDAGES/DRESSINGS) ×1
BLADE CLIPPER SURG (BLADE) ×4 IMPLANT
CANISTER SUCTION 2500CC (MISCELLANEOUS) ×4 IMPLANT
CANNULA VESSEL 3MM 2 BLNT TIP (CANNULA) ×4 IMPLANT
CATH CANNON HEMO 15F 50CM (CATHETERS) IMPLANT
CATH CANNON HEMO 15FR 19 (HEMODIALYSIS SUPPLIES) IMPLANT
CATH CANNON HEMO 15FR 23CM (HEMODIALYSIS SUPPLIES) IMPLANT
CATH CANNON HEMO 15FR 31CM (HEMODIALYSIS SUPPLIES) IMPLANT
CATH CANNON HEMO 15FR 32CM (HEMODIALYSIS SUPPLIES) IMPLANT
CATH EMB 4FR 80CM (CATHETERS) ×8 IMPLANT
CHLORAPREP W/TINT 26ML (MISCELLANEOUS) IMPLANT
CLIP TI MEDIUM 6 (CLIP) ×4 IMPLANT
CLIP TI WIDE RED SMALL 6 (CLIP) ×4 IMPLANT
COVER PROBE W GEL 5X96 (DRAPES) IMPLANT
DRAPE C-ARM 42X72 X-RAY (DRAPES) IMPLANT
DRAPE CHEST BREAST 15X10 FENES (DRAPES) IMPLANT
DRAPE INCISE IOBAN 66X45 STRL (DRAPES) ×4 IMPLANT
DRAPE X-RAY CASS 24X20 (DRAPES) IMPLANT
ELECT REM PT RETURN 9FT ADLT (ELECTROSURGICAL) ×4
ELECTRODE REM PT RTRN 9FT ADLT (ELECTROSURGICAL) ×2 IMPLANT
GAUZE SPONGE 2X2 8PLY STRL LF (GAUZE/BANDAGES/DRESSINGS) IMPLANT
GAUZE SPONGE 4X4 16PLY XRAY LF (GAUZE/BANDAGES/DRESSINGS) IMPLANT
GEL ULTRASOUND 20GR AQUASONIC (MISCELLANEOUS) IMPLANT
GLOVE BIO SURGEON STRL SZ7.5 (GLOVE) ×8 IMPLANT
GLOVE BIOGEL PI IND STRL 6.5 (GLOVE) ×2 IMPLANT
GLOVE BIOGEL PI IND STRL 8 (GLOVE) ×4 IMPLANT
GLOVE BIOGEL PI INDICATOR 6.5 (GLOVE) ×2
GLOVE BIOGEL PI INDICATOR 8 (GLOVE) ×4
GLOVE ECLIPSE 7.0 STRL STRAW (GLOVE) ×4 IMPLANT
GOWN STRL REUS W/ TWL LRG LVL3 (GOWN DISPOSABLE) ×6 IMPLANT
GOWN STRL REUS W/TWL LRG LVL3 (GOWN DISPOSABLE) ×6
GRAFT GORETEX STRT 4-7X45 (Vascular Products) ×4 IMPLANT
KIT BASIN OR (CUSTOM PROCEDURE TRAY) ×4 IMPLANT
KIT ROOM TURNOVER OR (KITS) ×4 IMPLANT
LIQUID BAND (GAUZE/BANDAGES/DRESSINGS) ×4 IMPLANT
NEEDLE 18GX1X1/2 (RX/OR ONLY) (NEEDLE) IMPLANT
NEEDLE 22X1 1/2 (OR ONLY) (NEEDLE) IMPLANT
NEEDLE HYPO 25GX1X1/2 BEV (NEEDLE) IMPLANT
NS IRRIG 1000ML POUR BTL (IV SOLUTION) ×4 IMPLANT
PACK CV ACCESS (CUSTOM PROCEDURE TRAY) ×4 IMPLANT
PACK SURGICAL SETUP 50X90 (CUSTOM PROCEDURE TRAY) IMPLANT
PAD ARMBOARD 7.5X6 YLW CONV (MISCELLANEOUS) ×8 IMPLANT
SET COLLECT BLD 21X3/4 12 (NEEDLE) IMPLANT
SPONGE GAUZE 2X2 STER 10/PKG (GAUZE/BANDAGES/DRESSINGS)
SPONGE SURGIFOAM ABS GEL 100 (HEMOSTASIS) IMPLANT
STOPCOCK 4 WAY LG BORE MALE ST (IV SETS) IMPLANT
SUCTION FRAZIER TIP 10 FR DISP (SUCTIONS) ×4 IMPLANT
SUT ETHILON 3 0 PS 1 (SUTURE) IMPLANT
SUT PROLENE 6 0 BV (SUTURE) ×24 IMPLANT
SUT VIC AB 3-0 SH 27 (SUTURE) ×4
SUT VIC AB 3-0 SH 27X BRD (SUTURE) ×4 IMPLANT
SUT VICRYL 4-0 PS2 18IN ABS (SUTURE) ×4 IMPLANT
SYR 20CC LL (SYRINGE) IMPLANT
SYR 5ML LL (SYRINGE) IMPLANT
SYR CONTROL 10ML LL (SYRINGE) IMPLANT
SYRINGE 10CC LL (SYRINGE) IMPLANT
TUBING EXTENTION W/L.L. (IV SETS) IMPLANT
UNDERPAD 30X30 INCONTINENT (UNDERPADS AND DIAPERS) ×4 IMPLANT
WATER STERILE IRR 1000ML POUR (IV SOLUTION) IMPLANT

## 2015-01-08 NOTE — Transfer of Care (Signed)
Immediate Anesthesia Transfer of Care Note  Patient: Holly Mendoza  Procedure(s) Performed: Procedure(s): THROMBECTOMY AND REVISION OF ARTERIOVENTOUS (AV) THIGH GORETEX  GRAFT (Left)  Patient Location: PACU  Anesthesia Type:General  Level of Consciousness: awake, alert  and oriented  Airway & Oxygen Therapy: Patient Spontanous Breathing and Patient connected to nasal cannula oxygen  Post-op Assessment: Report given to RN, Post -op Vital signs reviewed and stable and Patient moving all extremities X 4  Post vital signs: Reviewed and stable  Last Vitals:  Filed Vitals:   01/08/15 1333  BP: 224/111  Pulse:   Temp:   Resp:     Complications: No apparent anesthesia complications

## 2015-01-08 NOTE — Progress Notes (Signed)
Patient admitted through PACU after the said diagnosis or procedure. On arrival she is alert and oriented. Graft site CDI with the Ioban dressing. Placed on cardiac monitor. Oriented to room and call bell placed within reach.

## 2015-01-08 NOTE — Progress Notes (Signed)
        I called Nephrology and they will set her up for dialysis in the morning.  I spoke with Dr. Marisue Humble.  We replaced the medial aspect of the left thigh graft.  The lateral 1/3 was marked with lines and covered with Ioban to show where the graft can be used for HD tomorrow.  Sallie Staron MAUREEN PA-C

## 2015-01-08 NOTE — Anesthesia Procedure Notes (Signed)
Procedure Name: Intubation Date/Time: 01/08/2015 3:27 PM Performed by: Rise Patience T Pre-anesthesia Checklist: Patient identified, Emergency Drugs available, Suction available and Patient being monitored Patient Re-evaluated:Patient Re-evaluated prior to inductionOxygen Delivery Method: Circle system utilized Preoxygenation: Pre-oxygenation with 100% oxygen Intubation Type: IV induction Ventilation: Mask ventilation without difficulty Laryngoscope Size: Miller and 2 Grade View: Grade I Tube type: Oral Tube size: 7.5 mm Number of attempts: 1 Airway Equipment and Method: Stylet Placement Confirmation: ETT inserted through vocal cords under direct vision,  positive ETCO2 and breath sounds checked- equal and bilateral Secured at: 21 cm Tube secured with: Tape Dental Injury: Teeth and Oropharynx as per pre-operative assessment

## 2015-01-08 NOTE — Anesthesia Preprocedure Evaluation (Addendum)
Anesthesia Evaluation  Patient identified by MRN, date of birth, ID band Patient awake    Reviewed: Allergy & Precautions, NPO status , Patient's Chart, lab work & pertinent test results  Airway Mallampati: II   Neck ROM: Full    Dental  (+) Dental Advisory Given, Teeth Intact   Pulmonary neg pulmonary ROS,  breath sounds clear to auscultation        Cardiovascular hypertension, Pt. on medications Rhythm:Regular  ECHO 04/2014 EF 65%   Neuro/Psych    GI/Hepatic negative GI ROS,   Endo/Other    Renal/GU DialysisRenal disease     Musculoskeletal   Abdominal (+)  Abdomen: soft.    Peds  Hematology  (+) anemia , 9/27   Anesthesia Other Findings   Reproductive/Obstetrics                            Anesthesia Physical Anesthesia Plan  ASA: III  Anesthesia Plan: General   Post-op Pain Management:    Induction: Intravenous  Airway Management Planned: LMA and Oral ETT  Additional Equipment:   Intra-op Plan:   Post-operative Plan: Extubation in OR  Informed Consent: I have reviewed the patients History and Physical, chart, labs and discussed the procedure including the risks, benefits and alternatives for the proposed anesthesia with the patient or authorized representative who has indicated his/her understanding and acceptance.     Plan Discussed with:   Anesthesia Plan Comments:         Anesthesia Quick Evaluation

## 2015-01-08 NOTE — Progress Notes (Signed)
ANTIBIOTIC CONSULT NOTE - INITIAL  Pharmacy Consult for Zinacef Indication: surgical prophylaxis  Allergies  Allergen Reactions  . Contrast Media [Iodinated Diagnostic Agents] Anaphylaxis  . Procardia [Nifedipine] Other (See Comments)    Sweating, BP drops dangerously low,  . Vancomycin Anaphylaxis and Hives    Patient Measurements: Height: 5\' 8"  (172.7 cm) Weight: 160 lb (72.576 kg) IBW/kg (Calculated) : 63.9  Vital Signs: Temp: 97.6 F (36.4 C) (03/22 1952) Temp Source: Oral (03/22 1952) BP: 113/74 mmHg (03/22 1952) Pulse Rate: 72 (03/22 1952) Intake/Output from previous day:   Intake/Output from this shift:    Labs:  Recent Labs  01/08/15 1351  HGB 11.2*   Estimated Creatinine Clearance: 5.9 mL/min (by C-G formula based on Cr of 12.36). No results for input(s): VANCOTROUGH, VANCOPEAK, VANCORANDOM, GENTTROUGH, GENTPEAK, GENTRANDOM, TOBRATROUGH, TOBRAPEAK, TOBRARND, AMIKACINPEAK, AMIKACINTROU, AMIKACIN in the last 72 hours.   Microbiology: Recent Results (from the past 720 hour(s))  MRSA PCR Screening     Status: None   Collection Time: 12/20/14  1:47 AM  Result Value Ref Range Status   MRSA by PCR NEGATIVE NEGATIVE Final    Comment:        The GeneXpert MRSA Assay (FDA approved for NASAL specimens only), is one component of a comprehensive MRSA colonization surveillance program. It is not intended to diagnose MRSA infection nor to guide or monitor treatment for MRSA infections.     Assessment: 44 y.o. female s/p thrombectomy of L thigh graft. Pt is ESRD (o/p HD M/W/F). Pt received 1.5gm IV Zinacef pre-op ~1530. This will cover patient for 24 hours so pt will not need another dose.  Goal of Therapy:  Surgical prophylaxis  Plan:  No more Zinacef needed (pre-op dose will cover 24 hours in ESRD pt) Pharmacy will sign off - please reconsult if needed  Christoper Fabian, PharmD, BCPS Clinical pharmacist, pager [phone] 01/08/2015,8:05 PM

## 2015-01-08 NOTE — Consult Note (Signed)
Holly Mendoza 01/08/2015 Holly Mendoza:  Edilia Bo MD  Reason for Consult:  Comanagement of ESRD, Hyperkalemia HPI:  36F ESRD MWF at [facility] via LLE AVG which was clotted and came to [facility] for eval today, and felt to best be done surgically.  Had surgical thrombectomy and graft revision earlier today.  Last HD was 3/18, only 2h long, which is [place] for her (incredibly nonadherent).  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 K today is 5.6.  Pt eating now, no N/V.  No SOB or O2 req.  Pt also with chornic hypervolemia, req paracentesis intermittently.     Filed Weights   01/08/15 1328  Weight: 72.576 kg (160 lb)    I/O last 3 completed shifts: In: 700 [I.V.:700] Out: 300 [Blood:300]  ROS Balance of 12 systems is negative w/ exceptions as above  Outpt HD Orders Unit: [facility] Days: MWF Time: 4h Dialyzer: F180 EDW: 72.5kg K/Ca: 1K, 2.5Ca   Access: LLE AVG BFR/DFR: 400/800 UF Proflie: profile 2 VDRA: Hectorol 8 qTx EPO: aranesp 60 qWk IV Fe: Venofer 50 qWk  Heparin: No heparin Most Recent Phos / PTH: 14.2 / 1347 Most Recent TSAT : 30 Treatment Adherence: Very poor  PMH  Past Medical History  Diagnosis Date  . 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 Hypertension   . ESRD on hemodialysis 04/20/2014    Patient started HD in 1998.  She has been dialyzed in [place] at "[facility]" until moving to [place] in July 2015 to live with family.  She has had failed accesses in the L arm.  No attempt to place access was made in the R arm, patient is not sure why.  She has a L thigh AVG which she says has been functional for 7-8 years.  They stopped giving her heparin several years ago due to prolonged access bleeding.    Marland Kitchen Heart murmur   . Anemia of chronic disease   . History of blood transfusion     "several; w/transplant, infections, etc"  . Headache(784.0)     "often; sometimes daily" (04/26/2014)  . Migraines     "qod" (04/26/2014)   . ESRD on hemodialysis 04/20/2014    Patient started HD in 1998.  She has been dialyzed in [place] at "[facility]" until moving to [place] in July 2015 to live with family.  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  She has had failed accesses in the L arm.  No attempt to place access was made in the R arm, patient is not sure why.  She has a L thigh AVG which she says has been functional for  . Ascites   . Pulmonary hypertension   . Ascites    PSH  Past Surgical History  Procedure Laterality Date  . Arteriovenous graft placement Left 2013    thigh  . 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 Thoracentesis  last done ~ 2 weeks ago   FH  Family History  Problem Relation Age of Onset  . Healthy Mother   . Liver disease Maternal Uncle     drinker  . Asthma Maternal Grandmother    SH  reports that she has never smoked. She has never used smokeless tobacco. She reports that she does not drink alcohol or use illicit drugs. Allergies  Allergies  Allergen Reactions  .  Contrast Media [Iodinated Diagnostic Agents] Anaphylaxis  . Procardia [Nifedipine] Other (See Comments)    Sweating, BP drops dangerously low,  . Vancomycin Anaphylaxis and Hives   Home medications Prior to Admission medications   Medication Sig Start Date End Date Taking? Authorizing Provider  acetaminophen (TYLENOL) 500 MG tablet Take 500 mg by mouth every 6 (six) hours as needed for moderate pain.   Yes Historical Provider, MD  amLODipine (NORVASC) 10 MG tablet Take 1 tablet (10 mg total) by mouth daily. 09/11/14  Yes Dolores Patty, MD  aspirin EC 81 MG tablet Take 1 tablet (81 mg total) by mouth daily. 09/01/14  Yes Richarda Overlie, MD  calcium acetate (PHOSLO) 667 MG capsule Take 667 mg by mouth 3 (three) times daily with meals.    Yes Historical Provider, MD  cloNIDine (CATAPRES) 0.3 MG tablet Take 0.3 mg by mouth 2 (two) times  daily.   Yes Historical Provider, MD  esomeprazole (NEXIUM) 40 MG capsule Take 1 capsule (40 mg total) by mouth daily at 12 noon. 09/06/14  Yes Richarda Overlie, MD  Skin Protectants, Misc. (EUCERIN) cream Apply 1 application topically daily as needed for dry skin.    Yes Historical Provider, MD  traMADol (ULTRAM) 50 MG tablet Take 1 tablet (50 mg total) by mouth every 12 (twelve) hours as needed for severe pain. 12/21/14  Yes Christiane Ha, MD  ciprofloxacin (CIPRO) 500 MG tablet Take 1 tablet (500 mg total) by mouth daily with breakfast. Patient not taking: Reported on 01/08/2015 12/21/14   Christiane Ha, MD    Current Medications Scheduled Meds: . amLODipine  10 mg Oral Daily  . aspirin EC  81 mg Oral Daily  . cloNIDine  0.3 mg Oral BID  . docusate sodium  100 mg Oral BID  . [START ON 01/09/2015] pantoprazole  80 mg Oral Q1200  . potassium chloride  20-40 mEq Oral Once   Continuous Infusions: . sodium chloride 30 mL/hr at 01/08/15 2042   PRN Meds:.acetaminophen, bisacodyl, guaiFENesin-dextromethorphan, hydrALAZINE, labetalol, metoprolol, morphine injection, ondansetron, oxyCODONE, phenol, senna-docusate, traMADol  CBC  Recent Labs Lab 01/03/15 1319 01/08/15 1351  WBC 8.7  --   NEUTROABS 6.8  --   HGB 8.9 Repeated and verified X2.* 11.2*  HCT 27.0* 33.0*  MCV 87.2  --   PLT 257.0  --    Basic Metabolic Panel  Recent Labs Lab 01/03/15 1319 01/08/15 1351  NA 132* 132*  K 5.4* 5.6*  CL 94*  --   CO2 27  --   GLUCOSE 89 127*  BUN 98*  --   CREATININE 12.36*  --   CALCIUM 8.7  --     Physical Exam  Blood pressure 113/74, pulse 72, temperature 97.6 F (36.4 C), temperature source Oral, resp. rate 18, height  (1.727 m), weight 72.576 kg (160 lb), SpO2 98 %. GEN: NAD, eating chicken wings ENT: NCAT EYES: EOMI CV: RRR, MSM 2/6 PULM: CTAB ABD: s/nt/nd SKIN: no rashes/lesiosn EXT:1-2+ LEE   A/P 1. 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 Plan for HD tomorrow AM, outpt settings 2. Cont to work towards more Tx, longer time periods 3. No heparin, doesn't rec as outpt either 4. Use AVG, 15g 2. HTN/Vol: Aggressive UF tomorrow, chornic volume excess 3. Anemia: Cont outpt aranesp and maintenance Fe 4. MBD: COnt VDRA 5. Hyperphosphatemia: nonadherent, can offer binders 6. Revised LLE AVG, appreciate respnosiveness of VVS, use AVG tomorrow  Sabra Heck MD 01/08/2015, 9:20  PM

## 2015-01-08 NOTE — H&P (Signed)
[facility] [place]  Patient name: Holly Mendoza MRN: [id number] DOB: Sep 09, 1971 Sex: female  REASON FOR CONSULT: Clotted left thigh AV graft  HPI: Holly Mendoza is a 44 y.o. female who states that she had a left thigh AV graft placed 8-10 years ago. She states that she has not had any problems with the graft clotting until Saturday which was 3 days ago. She normally dialyzes on Monday Wednesdays and Fridays. Saturday she noted that her graft was clotted. She was set up for Thrombolysis by nephrology. When she presented for the procedure, it was noted that she had 2 large aneurysms and there was some concern that lysis might put her at increased risk for embolization. For this reason, we were asked to perform surgical thrombectomy of her graft.  She denies any recent uremic symptoms. Specifically, she denies nausea, vomiting, anorexia, palpitations, or fatigue.  Past Medical History  Diagnosis Date  . 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 Hypertension   . ESRD on hemodialysis 04/20/2014    Patient started HD in 1998.  She has been dialyzed in [place] at "[facility]" until moving to [place] in July 2015 to live with family.  She has had failed accesses in the L arm.  No attempt to place access was made in the R arm, patient is not sure why.  She has a L thigh AVG which she says has been functional for 7-8 years.  They stopped giving her heparin several years ago due to prolonged access bleeding.    Marland Kitchen Heart murmur   . Anemia of chronic disease   . History of blood transfusion     "several; w/transplant, infections, etc"  . Headache(784.0)     "often; sometimes daily" (04/26/2014)  . Migraines     "qod" (04/26/2014)  . ESRD on hemodialysis 04/20/2014    Patient started HD in 1998.  She has been dialyzed in [place] at "[facility]" until moving to [place] in July 2015 to live with family.  She had a transplant in 2012 which  didn't take and it was removed in 2013.  She has had failed accesses in the L arm.  No attempt to place access was made in the R arm, patient is not sure why.  She has a L thigh AVG which she says has been functional for  . Ascites   . Pulmonary hypertension   . Ascites     Family History  Problem Relation Age of Onset  . Healthy Mother   . Liver disease Maternal Uncle     drinker  . Asthma Maternal Grandmother     SOCIAL HISTORY: History  Substance Use Topics  . Smoking status: Never Smoker   . Smokeless tobacco: Never Used  . Alcohol Use: No    Allergies  Allergen Reactions  . Contrast Media [Iodinated Diagnostic Agents] Anaphylaxis  . Procardia [Nifedipine] Other (See Comments)    Sweating, BP drops dangerously low,  . Vancomycin Anaphylaxis and Hives    Current Facility-Administered Medications  Medication Dose Route Frequency Provider Last Rate Last Dose  . 0.9 %  sodium chloride infusion   Intravenous Continuous Chuck Hint, MD      . cefUROXime (ZINACEF) 1.5 g in dextrose 5 % 50 mL IVPB  1.5 g Intravenous 30 min Pre-Op Chuck Hint, MD      . Chlorhexidine Gluconate Cloth 2 % PADS 6 each  6 each Topical  Once Chuck Hint, MD      . cloNIDine (CATAPRES) tablet 0.3 mg  0.3 mg Oral STAT Sebastian Ache, MD        REVIEW OF SYSTEMS: Arly.Keller ] denotes positive finding; [  ] denotes negative finding CARDIOVASCULAR:   chest pain    chest pressure    palpitations    orthopnea    dyspnea on exertion    claudication    rest pain    DVT    phlebitis PULMONARY:    productive cough    asthma    wheezing NEUROLOGIC:    weakness   paresthesias   aphasia   amaurosis   dizziness HEMATOLOGIC:   Arly.Keller ] bleeding problems    clotting disorders MUSCULOSKELETAL:   joint pain    joint swelling  leg swelling GASTROINTESTINAL:   blood in stool    hematemesis GENITOURINARY:    dysuria     hematuria PSYCHIATRIC:   history of major depression INTEGUMENTARY:   rashes   ulcers CONSTITUTIONAL:   fever    chills  PHYSICAL EXAM: Filed Vitals:   01/08/15 1328 01/08/15 1333  BP:  224/111  Pulse: 73   Temp: 97.5 F (36.4 C)   TempSrc: Oral   Resp: 16   Height:  (1.727 m)   Weight: 160 lb (72.576 kg)   SpO2: 100%    Body mass index is 24.33 kg/(m^2). GENERAL: The patient is a well-nourished female, in no acute distress. The vital signs are documented above. CARDIOVASCULAR: There is a regular rate and rhythm. Her left thigh AV graft does not have a bruit or thrill. PULMONARY: There is good air exchange bilaterally without wheezing or rales. ABDOMEN: Soft and non-tender with normal pitched bowel sounds.  MUSCULOSKELETAL: There are no major deformities or cyanosis. NEUROLOGIC: No focal weakness or paresthesias are detected. SKIN: There are no ulcers or rashes noted. PSYCHIATRIC: The patient has a normal affect.  DATA:  Lab Results  Component Value Date   WBC 8.7 01/03/2015   HGB 11.2* 01/08/2015   HCT 33.0* 01/08/2015   MCV 87.2 01/03/2015   PLT 257.0 01/03/2015   Lab Results  Component Value Date   NA 132* 01/08/2015   K 5.6* 01/08/2015   CL 94* 01/03/2015   CO2 27 01/03/2015   Lab Results  Component Value Date   CREATININE 12.36* 01/03/2015   Lab Results  Component Value Date   INR 1.1* 01/03/2015   Lab Results  Component Value Date   HGBA1C 5.2 04/25/2014   MEDICAL ISSUES: This patient presents with a clotted left thigh AV graft. I have recommended attempted thrombectomy of her graft. 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 I recommended that we place the venous half of the graft and subsequently she could be considered to have the arterial half of the graft replaced given the aneurysm. I've also explained that if we are unable to salvage the graft, she could potentially require  placement of a tunneled dialysis catheter.  Latoya Diskin S [facility] [place] Beeper: [phone]

## 2015-01-08 NOTE — Anesthesia Postprocedure Evaluation (Signed)
  Anesthesia Post-op Note  Patient: Holly Mendoza  Procedure(s) Performed: Procedure(s): THROMBECTOMY AND REVISION OF ARTERIOVENTOUS (AV) THIGH GORETEX  GRAFT (Left)  Patient Location: PACU  Anesthesia Type:General  Level of Consciousness: awake, alert , oriented and patient cooperative  Airway and Oxygen Therapy: Patient Spontanous Breathing  Post-op Pain: mild, moderate  Post-op Assessment: Post-op Vital signs reviewed, Patient's Cardiovascular Status Stable, Respiratory Function Stable, Patent Airway, No signs of Nausea or vomiting and Pain level controlled  Post-op Vital Signs: stable  Last Vitals:  Filed Vitals:   01/08/15 1909  BP:   Pulse: 77  Temp:   Resp: 10    Complications: No apparent anesthesia complications

## 2015-01-08 NOTE — Op Note (Signed)
NAME: Holly Mendoza    MRN: [id number] DOB: 10-30-1970    DATE OF OPERATION: 01/08/2015  PREOP DIAGNOSIS: clotted left thigh AV graft  POSTOP DIAGNOSIS: same  PROCEDURE:  1. Thrombectomy of left thigh AV graft 2. Replaced venous half (medial side) of graft with 7 mm PTFE 3. Revision of arterial end of graft with interposition 7 mm PTFE  SURGEON: Di Kindle. Edilia Bo, MD, FACS  ASSIST: Lianne Cure PA  ANESTHESIA: Gen.   EBL: 100 cc  INDICATIONS: Holly Mendoza is a 44 y.o. female who has had a left thigh AV graft for 8-10 years. Her graft clotted 3 days ago when she was set up for thrombolysis. However when she presented for the procedure today the nephrologist noted that she had 2 large aneurysms and did not feel that it was safe to proceed. This reason we are asked to perform surgical thrombectomy.  FINDINGS: Markedly degenerative graft with aneurysms on the medial and lateral aspect of the graft. It was markedly calcific disease throughout the graft. I replaced the venous half of the graft. The venous anastomosis was widely patent and took a 5 mm dilator although there may be some more proximal venous obstruction beyond the anastomosis. The arterial limb of the graft was markedly calcified and a short segment had to be excised. This was replaced with an interposition 7 mm PTFE graft.  TECHNIQUE: The patient was taken to the operating room and received a general anesthetic. The left thigh was prepped and draped in the usual sterile fashion. A plan was to replace the venous half of the graft is the patient had an aneurysm on both sides. The previous incision in the groin was opened and through scar tissue the arterial and venous limbs of the graft were dissected free. Both segments of graft were markedly calcified. In the distal loop of the graft and incision was made over the graft and the graft here was dissected free. A tunnel was greater from this incision to the groin incision  so that I could replace the venous, medial, half of the graft. The patient was heparinized. I divided the venous limb the graft near the groin. Fogarty catheter was advanced through the venous anastomosis without difficulty. I was able to pass a 5 mm dilator without evidence of obstruction although there appeared to be some obstruction as far as I could reach with the Fogarty suggesting disease in the iliac vein although not in the anastomosis itself. Next Fogarty catheter was advanced to the distal loop of the graft to thrombectomize the arterial half of the graft although the graft was degenerative and I had to open the graft at the arterial end in order to retrieve the arterial plug. This was retrieved directly and was excellent inflow. The arterial segment of graft was thrombectomized from both ends. Given the marked calcific disease in the proximal arterial limb of the graft I had to excise a calcified segment as I could not so with this level and there was too much damage to the graft. An interposition 7 mm PTFE graft was sewn into in the both ends. Next the new segment of graft along the venous half was sewn into in distally and also into and in the groin to the venous limb of the graft. At the completion the graft was pulsatile consistent with possible iliac vein obstruction. However there was excellent flow in the outflow vein with good diastolic flow. At the completion the heparin was partially reversed with protamine.  Hemostasis was obtained in the wounds. The distal incision was closed with a deeper 3-0 Vicryl and the skin closed with 4-0 Vicryl. The groin incision was closed with 2 deep layers of 3-0 Vicryl and the skin closed with 4-0 Vicryl. Dermabond was applied. Patient tolerated the procedure well and transferred to the recovery room in stable condition. All needle and sponge counts were correct.  CLINICAL NOTE: If the patient has poor flows on dialysis and consider a fistulogram to look for an  outflow stenosis that could potentially be addressed with venoplasty. If the graft occludes she would require placement of a tunneled dialysis catheter and have to be evaluated for new access in the right thigh as this graft is markedly degenerative with multiple issues.  Waverly Ferrari, MD, FACS [facility]  DATE OF DICTATION:   01/08/2015

## 2015-01-09 ENCOUNTER — Telehealth: Payer: Self-pay | Admitting: Vascular Surgery

## 2015-01-09 ENCOUNTER — Encounter (HOSPITAL_COMMUNITY): Payer: Self-pay | Admitting: Vascular Surgery

## 2015-01-09 ENCOUNTER — Encounter: Payer: Self-pay | Admitting: Internal Medicine

## 2015-01-09 LAB — RENAL FUNCTION PANEL
ALBUMIN: 1.5 g/dL — AB (ref 3.5–5.2)
Anion gap: 15 (ref 5–15)
BUN: 115 mg/dL — AB (ref 6–23)
CO2: 20 mmol/L (ref 19–32)
Calcium: 8 mg/dL — ABNORMAL LOW (ref 8.4–10.5)
Chloride: 97 mmol/L (ref 96–112)
Creatinine, Ser: 14.17 mg/dL — ABNORMAL HIGH (ref 0.50–1.10)
GFR calc Af Amer: 3 mL/min — ABNORMAL LOW (ref >90)
GFR calc non Af Amer: 3 mL/min — ABNORMAL LOW (ref >90)
Glucose, Bld: 94 mg/dL (ref 70–99)
PHOSPHORUS: 12.7 mg/dL — AB (ref 2.3–4.6)
Potassium: 6 mmol/L — ABNORMAL HIGH (ref 3.5–5.1)
Sodium: 132 mmol/L — ABNORMAL LOW (ref 135–145)

## 2015-01-09 MED ORDER — SODIUM CHLORIDE 0.9 % IV SOLN: 100.0000 mL | INTRAVENOUS | Status: DC | PRN

## 2015-01-09 MED ORDER — METOPROLOL TARTRATE 1 MG/ML IV SOLN
INTRAVENOUS | Status: AC
  Filled 2015-01-09: qty 5

## 2015-01-09 MED ORDER — OXYCODONE HCL 5 MG PO TABS: 5.0000 mg | ORAL_TABLET | ORAL | Status: DC | PRN

## 2015-01-09 MED ORDER — PENTAFLUOROPROP-TETRAFLUOROETH EX AERO: 1.0000 "application " | INHALATION_SPRAY | CUTANEOUS | Status: DC | PRN

## 2015-01-09 MED ORDER — NEPRO/CARBSTEADY PO LIQD
237.0000 mL | ORAL | Status: DC | PRN
  Filled 2015-01-09: qty 237

## 2015-01-09 MED ORDER — HEPARIN SODIUM (PORCINE) 1000 UNIT/ML DIALYSIS
1000.0000 [IU] | INTRAMUSCULAR | Status: DC | PRN
  Filled 2015-01-09: qty 1

## 2015-01-09 MED ORDER — ALTEPLASE 2 MG IJ SOLR
2.0000 mg | Freq: Once | INTRAMUSCULAR | Status: DC | PRN
  Filled 2015-01-09: qty 2

## 2015-01-09 MED ORDER — LIDOCAINE HCL (PF) 1 % IJ SOLN: 5.0000 mL | INTRAMUSCULAR | Status: DC | PRN

## 2015-01-09 MED ORDER — LIDOCAINE-PRILOCAINE 2.5-2.5 % EX CREA
1.0000 "application " | TOPICAL_CREAM | CUTANEOUS | Status: DC | PRN
  Filled 2015-01-09: qty 5

## 2015-01-09 NOTE — Progress Notes (Signed)
   VASCULAR SURGERY ASSESSMENT & PLAN:  * 1 Day Post-Op s/p: thrombectomy and revision of left thigh AV graft.  *  Graft has an excellent thrill.  * Plan discharge today after dialysis.   SUBJECTIVE: no complaints.  PHYSICAL EXAM: Filed Vitals:   01/08/15 1915 01/08/15 1952 01/08/15 1955 01/09/15 0438  BP: 120/77 113/74  117/70  Pulse: 81 72  74  Temp: 98.6 F (37 C) 97.6 F (36.4 C)  97.9 F (36.6 C)  TempSrc:  Oral  Oral  Resp: 15 18  18   Height:      Weight:    185 lb 3 oz (84 kg)  SpO2: 100% 98% 98% 98%   Graft has an excellent thrill.  Active Problems:   ESRD (end stage renal disease)   Cari Caraway Beeper: [phone] 01/09/2015

## 2015-01-09 NOTE — Progress Notes (Signed)
Discharge instructions given. Pt verbalized understanding and all questions were answered.  

## 2015-01-09 NOTE — Procedures (Signed)
I was present at this dialysis session, have reviewed the session itself and made  appropriate changes  Vinson Moselle MD (pgr) [phone]    ([phone] 01/09/2015, 11:02 AM

## 2015-01-09 NOTE — Progress Notes (Signed)
Pt's aunt will be here soon to take her home. Pt c/o pain. PRN pain med given.

## 2015-01-09 NOTE — Telephone Encounter (Signed)
-----   Message from Sharee Pimple, RN sent at 01/09/2015 10:46 AM EDT ----- Regarding: Schedule FYI,  [facility] knows to call us if needed. Doesn't need follow up, pt has AVG.  ----- Message -----    From: Chuck Hint, MD    Sent: 01/08/2015   6:07 PM      To: Vvs Charge Pool Subject: charge                                         PROCEDURE:  1. Thrombectomy of left thigh AV graft 2. Replaced venous half (medial side) of graft with 7 mm PTFE 3. Revision of arterial end of graft with interposition 7 mm PTFE  SURGEON: Di Kindle. Edilia Bo, MD, FACS  ASSIST: Lianne Cure PA  CLINICAL NOTE: If the patient has poor flows on dialysis and consider a fistulogram to look for an outflow stenosis that could potentially be addressed with venoplasty. If the graft occludes she would require placement of a tunneled dialysis catheter and have to be evaluated for new access in the right thigh as this graft is markedly degenerative with multiple issues.

## 2015-01-10 ENCOUNTER — Telehealth: Payer: Self-pay | Admitting: *Deleted

## 2015-01-10 ENCOUNTER — Other Ambulatory Visit: Payer: Self-pay | Admitting: Internal Medicine

## 2015-01-10 ENCOUNTER — Ambulatory Visit (HOSPITAL_COMMUNITY)
Admission: RE | Admit: 2015-01-10 | Discharge: 2015-01-10 | Disposition: A | Discharge status: Home / Self Care | Payer: Medicare Other | Source: Ambulatory Visit | Attending: Internal Medicine | Admitting: Internal Medicine

## 2015-01-10 DIAGNOSIS — R7989 Other specified abnormal findings of blood chemistry: Secondary | ICD-10-CM | POA: Diagnosis not present

## 2015-01-10 DIAGNOSIS — R188 Other ascites: Secondary | ICD-10-CM | POA: Insufficient documentation

## 2015-01-10 DIAGNOSIS — R072 Precordial pain: Secondary | ICD-10-CM | POA: Diagnosis not present

## 2015-01-10 DIAGNOSIS — K7460 Unspecified cirrhosis of liver: Secondary | ICD-10-CM

## 2015-01-10 LAB — BODY FLUID CELL COUNT WITH DIFFERENTIAL
Lymphs, Fluid: 93 %
MONOCYTE-MACROPHAGE-SEROUS FLUID: 1 % — AB (ref 50–90)
NEUTROPHIL FLUID: 6 % (ref 0–25)
WBC FLUID: 257 uL (ref 0–1000)

## 2015-01-10 MED ORDER — ALBUMIN HUMAN 25 % IV SOLN
50.0000 g | Freq: Once | INTRAVENOUS | Status: AC
Start: 2015-01-10 — End: 2015-01-10
  Administered 2015-01-10: 50 g via INTRAVENOUS
  Filled 2015-01-10: qty 200

## 2015-01-10 MED ORDER — LIDOCAINE HCL (PF) 1 % IJ SOLN
INTRAMUSCULAR | Status: AC
Start: 2015-01-10 — End: 2015-01-10
  Filled 2015-01-10: qty 10

## 2015-01-10 NOTE — Addendum Note (Signed)
Addended by: Richardson Chiquito on: 01/10/2015 10:52 AM   Modules accepted: Orders

## 2015-01-10 NOTE — Procedures (Signed)
Successful US guided paracentesis from RUQ.  Yielded 4.7 liters of bloody fluid.  No immediate complications.  Pt tolerated well.  Stopped procedure early with remaining fluid secondary to patient's complaint of pain.  Specimen was sent for labs.  Pattricia Boss D PA-C 01/10/2015 1:40 PM

## 2015-01-10 NOTE — Telephone Encounter (Signed)
Morrie Sheldon with [facility] called stating that patient is at [facility] for her ultrasound elastography. Unfortunately, they are unable to do the elastography because there is too much fluid on the abdomen. They are requesting orders for a paracentesis. Per Dr Lauro Franklin verbal orders, patient may have therapeutic paracentesis, removing up to 6 L. Patient needs albumin 50 g replacement peri-paracentesis. Orders placed in EPIC and Morrie Sheldon advised.

## 2015-01-13 ENCOUNTER — Encounter (HOSPITAL_COMMUNITY): Payer: Self-pay | Admitting: *Deleted

## 2015-01-13 ENCOUNTER — Emergency Department (HOSPITAL_COMMUNITY): Payer: Medicare Other

## 2015-01-13 ENCOUNTER — Inpatient Hospital Stay (HOSPITAL_COMMUNITY)
Admission: EM | Admit: 2015-01-13 | Discharge: 2015-01-15 | DRG: 313 | Disposition: A | Discharge status: Home / Self Care | Payer: Medicare Other | Attending: Internal Medicine | Admitting: Internal Medicine

## 2015-01-13 DIAGNOSIS — K766 Portal hypertension: Secondary | ICD-10-CM | POA: Diagnosis present

## 2015-01-13 DIAGNOSIS — I120 Hypertensive chronic kidney disease with stage 5 chronic kidney disease or end stage renal disease: Secondary | ICD-10-CM | POA: Diagnosis present

## 2015-01-13 DIAGNOSIS — Z7982 Long term (current) use of aspirin: Secondary | ICD-10-CM | POA: Diagnosis not present

## 2015-01-13 DIAGNOSIS — J90 Pleural effusion, not elsewhere classified: Secondary | ICD-10-CM | POA: Diagnosis present

## 2015-01-13 DIAGNOSIS — R7989 Other specified abnormal findings of blood chemistry: Secondary | ICD-10-CM | POA: Diagnosis present

## 2015-01-13 DIAGNOSIS — I279 Pulmonary heart disease, unspecified: Secondary | ICD-10-CM | POA: Diagnosis not present

## 2015-01-13 DIAGNOSIS — R072 Precordial pain: Secondary | ICD-10-CM | POA: Diagnosis present

## 2015-01-13 DIAGNOSIS — T8612 Kidney transplant failure: Secondary | ICD-10-CM | POA: Diagnosis present

## 2015-01-13 DIAGNOSIS — Z888 Allergy status to other drugs, medicaments and biological substances status: Secondary | ICD-10-CM

## 2015-01-13 DIAGNOSIS — E875 Hyperkalemia: Secondary | ICD-10-CM | POA: Diagnosis present

## 2015-01-13 DIAGNOSIS — E039 Hypothyroidism, unspecified: Secondary | ICD-10-CM | POA: Diagnosis present

## 2015-01-13 DIAGNOSIS — N189 Chronic kidney disease, unspecified: Secondary | ICD-10-CM

## 2015-01-13 DIAGNOSIS — K2900 Acute gastritis without bleeding: Secondary | ICD-10-CM | POA: Diagnosis present

## 2015-01-13 DIAGNOSIS — D72829 Elevated white blood cell count, unspecified: Secondary | ICD-10-CM | POA: Diagnosis present

## 2015-01-13 DIAGNOSIS — D631 Anemia in chronic kidney disease: Secondary | ICD-10-CM | POA: Diagnosis present

## 2015-01-13 DIAGNOSIS — R188 Other ascites: Secondary | ICD-10-CM | POA: Diagnosis present

## 2015-01-13 DIAGNOSIS — I272 Other secondary pulmonary hypertension: Secondary | ICD-10-CM | POA: Diagnosis present

## 2015-01-13 DIAGNOSIS — K7460 Unspecified cirrhosis of liver: Secondary | ICD-10-CM | POA: Diagnosis present

## 2015-01-13 DIAGNOSIS — I2721 Secondary pulmonary arterial hypertension: Secondary | ICD-10-CM | POA: Diagnosis present

## 2015-01-13 DIAGNOSIS — G43909 Migraine, unspecified, not intractable, without status migrainosus: Secondary | ICD-10-CM | POA: Diagnosis present

## 2015-01-13 DIAGNOSIS — Z881 Allergy status to other antibiotic agents status: Secondary | ICD-10-CM | POA: Diagnosis not present

## 2015-01-13 DIAGNOSIS — K219 Gastro-esophageal reflux disease without esophagitis: Secondary | ICD-10-CM | POA: Diagnosis present

## 2015-01-13 DIAGNOSIS — R079 Chest pain, unspecified: Secondary | ICD-10-CM | POA: Diagnosis not present

## 2015-01-13 DIAGNOSIS — N186 End stage renal disease: Secondary | ICD-10-CM | POA: Diagnosis present

## 2015-01-13 DIAGNOSIS — R778 Other specified abnormalities of plasma proteins: Secondary | ICD-10-CM

## 2015-01-13 DIAGNOSIS — E785 Hyperlipidemia, unspecified: Secondary | ICD-10-CM | POA: Diagnosis present

## 2015-01-13 DIAGNOSIS — Z91041 Radiographic dye allergy status: Secondary | ICD-10-CM | POA: Diagnosis not present

## 2015-01-13 DIAGNOSIS — Z9119 Patient's noncompliance with other medical treatment and regimen: Secondary | ICD-10-CM | POA: Diagnosis present

## 2015-01-13 DIAGNOSIS — R9431 Abnormal electrocardiogram [ECG] [EKG]: Secondary | ICD-10-CM | POA: Diagnosis present

## 2015-01-13 DIAGNOSIS — Z992 Dependence on renal dialysis: Secondary | ICD-10-CM | POA: Diagnosis not present

## 2015-01-13 LAB — CBC WITH DIFFERENTIAL/PLATELET
BASOS PCT: 0 % (ref 0–1)
Basophils Absolute: 0 10*3/uL (ref 0.0–0.1)
EOS ABS: 0.2 10*3/uL (ref 0.0–0.7)
EOS PCT: 2 % (ref 0–5)
HEMATOCRIT: 30.2 % — AB (ref 36.0–46.0)
HEMOGLOBIN: 9.6 g/dL — AB (ref 12.0–15.0)
Lymphocytes Relative: 7 % — ABNORMAL LOW (ref 12–46)
Lymphs Abs: 1.1 10*3/uL (ref 0.7–4.0)
MCH: 27.7 pg (ref 26.0–34.0)
MCHC: 31.8 g/dL (ref 30.0–36.0)
MCV: 87 fL (ref 78.0–100.0)
MONO ABS: 1.2 10*3/uL — AB (ref 0.1–1.0)
MONOS PCT: 8 % (ref 3–12)
Neutro Abs: 13 10*3/uL — ABNORMAL HIGH (ref 1.7–7.7)
Neutrophils Relative %: 83 % — ABNORMAL HIGH (ref 43–77)
Platelets: 358 10*3/uL (ref 150–400)
RBC: 3.47 MIL/uL — AB (ref 3.87–5.11)
RDW: 14.2 % (ref 11.5–15.5)
WBC: 15.6 10*3/uL — AB (ref 4.0–10.5)

## 2015-01-13 LAB — HEPATIC FUNCTION PANEL
ALK PHOS: 143 U/L — AB (ref 39–117)
ALT: 15 U/L (ref 0–35)
AST: 15 U/L (ref 0–37)
Albumin: 2.1 g/dL — ABNORMAL LOW (ref 3.5–5.2)
BILIRUBIN TOTAL: 0.6 mg/dL (ref 0.3–1.2)
Bilirubin, Direct: <0.1 mg/dL (ref 0.0–0.5)
Total Protein: 5.5 g/dL — ABNORMAL LOW (ref 6.0–8.3)

## 2015-01-13 LAB — TROPONIN I
TROPONIN I: 0.15 ng/mL — AB (ref <0.031)
TROPONIN I: 0.26 ng/mL — AB (ref <0.031)
TROPONIN I: 0.25 ng/mL — AB (ref <0.031)
Troponin I: 0.11 ng/mL — ABNORMAL HIGH (ref <0.031)

## 2015-01-13 LAB — BASIC METABOLIC PANEL
ANION GAP: 18 — AB (ref 5–15)
BUN: 100 mg/dL — ABNORMAL HIGH (ref 6–23)
CALCIUM: 8 mg/dL — AB (ref 8.4–10.5)
CHLORIDE: 93 mmol/L — AB (ref 96–112)
CO2: 24 mmol/L (ref 19–32)
CREATININE: 14.17 mg/dL — AB (ref 0.50–1.10)
GFR, EST AFRICAN AMERICAN: 3 mL/min — AB (ref >90)
GFR, EST NON AFRICAN AMERICAN: 3 mL/min — AB (ref >90)
Glucose, Bld: 107 mg/dL — ABNORMAL HIGH (ref 70–99)
Potassium: 5.3 mmol/L — ABNORMAL HIGH (ref 3.5–5.1)
SODIUM: 135 mmol/L (ref 135–145)

## 2015-01-13 LAB — APTT
APTT: 30 s (ref 24–37)
aPTT: 53 seconds — ABNORMAL HIGH (ref 24–37)

## 2015-01-13 LAB — BODY FLUID CULTURE
CULTURE: NO GROWTH
GRAM STAIN: NONE SEEN

## 2015-01-13 LAB — BRAIN NATRIURETIC PEPTIDE: B Natriuretic Peptide: 2116.2 pg/mL — ABNORMAL HIGH (ref 0.0–100.0)

## 2015-01-13 MED ORDER — ONDANSETRON HCL 4 MG/2ML IJ SOLN
4.0000 mg | Freq: Once | INTRAMUSCULAR | Status: DC
  Filled 2015-01-13 (×2): qty 2

## 2015-01-13 MED ORDER — HYDROMORPHONE HCL 1 MG/ML IJ SOLN
0.5000 mg | INTRAMUSCULAR | Status: DC | PRN
  Administered 2015-01-14 (×2): 0.5 mg via INTRAVENOUS
  Filled 2015-01-13: qty 1

## 2015-01-13 MED ORDER — HEPARIN BOLUS VIA INFUSION
3500.0000 [IU] | Freq: Once | INTRAVENOUS | Status: DC
  Filled 2015-01-13: qty 3500

## 2015-01-13 MED ORDER — CALCIUM ACETATE 667 MG PO CAPS: 1334.0000 mg | ORAL_CAPSULE | Freq: Three times a day (TID) | ORAL | Status: DC

## 2015-01-13 MED ORDER — HEPARIN SODIUM (PORCINE) 5000 UNIT/ML IJ SOLN: 5000.0000 [IU] | Freq: Three times a day (TID) | INTRAMUSCULAR | Status: DC

## 2015-01-13 MED ORDER — PANTOPRAZOLE SODIUM 40 MG IV SOLR
40.0000 mg | Freq: Two times a day (BID) | INTRAVENOUS | Status: DC
  Administered 2015-01-13 – 2015-01-15 (×5): 40 mg via INTRAVENOUS
  Filled 2015-01-13 (×7): qty 40

## 2015-01-13 MED ORDER — CALCIUM ACETATE 667 MG PO CAPS
667.0000 mg | ORAL_CAPSULE | Freq: Three times a day (TID) | ORAL | Status: DC
  Filled 2015-01-13 (×3): qty 1

## 2015-01-13 MED ORDER — ASPIRIN 325 MG PO TABS
325.0000 mg | ORAL_TABLET | Freq: Every day | ORAL | Status: DC
  Administered 2015-01-13 – 2015-01-14 (×2): 325 mg via ORAL
  Filled 2015-01-13 (×3): qty 1

## 2015-01-13 MED ORDER — CLONIDINE HCL 0.3 MG PO TABS
0.3000 mg | ORAL_TABLET | Freq: Two times a day (BID) | ORAL | Status: DC
  Administered 2015-01-13: 0.3 mg via ORAL
  Filled 2015-01-13 (×4): qty 1

## 2015-01-13 MED ORDER — SODIUM CHLORIDE 0.9 % IJ SOLN
3.0000 mL | Freq: Two times a day (BID) | INTRAMUSCULAR | Status: DC
  Administered 2015-01-13 – 2015-01-15 (×2): 3 mL via INTRAVENOUS

## 2015-01-13 MED ORDER — ALUM & MAG HYDROXIDE-SIMETH 200-200-20 MG/5ML PO SUSP
30.0000 mL | Freq: Once | ORAL | Status: AC
  Administered 2015-01-13: 30 mL via ORAL
  Filled 2015-01-13: qty 30

## 2015-01-13 MED ORDER — TRAMADOL HCL 50 MG PO TABS: 50.0000 mg | ORAL_TABLET | Freq: Two times a day (BID) | ORAL | Status: DC | PRN

## 2015-01-13 MED ORDER — ONDANSETRON HCL 4 MG PO TABS: 4.0000 mg | ORAL_TABLET | Freq: Four times a day (QID) | ORAL | Status: DC | PRN

## 2015-01-13 MED ORDER — FENTANYL CITRATE 0.05 MG/ML IJ SOLN
50.0000 ug | Freq: Once | INTRAMUSCULAR | Status: AC
  Administered 2015-01-13: 50 ug via INTRAVENOUS
  Filled 2015-01-13: qty 2

## 2015-01-13 MED ORDER — SODIUM CHLORIDE 0.9 % IV SOLN: 62.5000 mg | INTRAVENOUS | Status: DC

## 2015-01-13 MED ORDER — HYDROCERIN EX CREA
1.0000 "application " | TOPICAL_CREAM | Freq: Every day | CUTANEOUS | Status: DC | PRN
  Filled 2015-01-13: qty 113

## 2015-01-13 MED ORDER — DARBEPOETIN ALFA 60 MCG/0.3ML IJ SOSY: 60.0000 ug | PREFILLED_SYRINGE | INTRAMUSCULAR | Status: DC

## 2015-01-13 MED ORDER — ASPIRIN EC 81 MG PO TBEC: 81.0000 mg | DELAYED_RELEASE_TABLET | Freq: Every day | ORAL | Status: DC

## 2015-01-13 MED ORDER — ONDANSETRON HCL 4 MG/2ML IJ SOLN: 4.0000 mg | Freq: Four times a day (QID) | INTRAMUSCULAR | Status: DC | PRN

## 2015-01-13 MED ORDER — DARBEPOETIN ALFA 60 MCG/0.3ML IJ SOSY
60.0000 ug | PREFILLED_SYRINGE | Freq: Once | INTRAMUSCULAR | Status: DC
  Filled 2015-01-13: qty 0.3

## 2015-01-13 MED ORDER — SODIUM CHLORIDE 0.9 % IV SOLN
0.0500 mg/kg/h | INTRAVENOUS | Status: DC
  Administered 2015-01-13: 0.05 mg/kg/h via INTRAVENOUS
  Filled 2015-01-13: qty 250

## 2015-01-13 MED ORDER — NITROGLYCERIN 0.4 MG SL SUBL: 0.4000 mg | SUBLINGUAL_TABLET | SUBLINGUAL | Status: DC | PRN

## 2015-01-13 MED ORDER — ALUM & MAG HYDROXIDE-SIMETH 200-200-20 MG/5ML PO SUSP: 30.0000 mL | Freq: Four times a day (QID) | ORAL | Status: DC | PRN

## 2015-01-13 MED ORDER — METHYLPREDNISOLONE SODIUM SUCC 125 MG IJ SOLR
125.0000 mg | Freq: Once | INTRAMUSCULAR | Status: DC
  Filled 2015-01-13: qty 2

## 2015-01-13 MED ORDER — OXYCODONE HCL 5 MG PO TABS: 5.0000 mg | ORAL_TABLET | ORAL | Status: DC | PRN

## 2015-01-13 MED ORDER — ALUM & MAG HYDROXIDE-SIMETH 200-200-20 MG/5ML PO SUSP: 15.0000 mL | Freq: Four times a day (QID) | ORAL | Status: DC | PRN

## 2015-01-13 MED ORDER — NITROGLYCERIN 2 % TD OINT
1.0000 [in_us] | TOPICAL_OINTMENT | Freq: Once | TRANSDERMAL | Status: DC
  Filled 2015-01-13: qty 1

## 2015-01-13 MED ORDER — ACETAMINOPHEN 500 MG PO TABS: 500.0000 mg | ORAL_TABLET | Freq: Four times a day (QID) | ORAL | Status: DC | PRN

## 2015-01-13 MED ORDER — CALCIUM ACETATE (PHOS BINDER) 667 MG PO CAPS
1334.0000 mg | ORAL_CAPSULE | Freq: Three times a day (TID) | ORAL | Status: DC
  Administered 2015-01-13 – 2015-01-15 (×5): 1334 mg via ORAL
  Filled 2015-01-13 (×8): qty 2

## 2015-01-13 MED ORDER — AMLODIPINE BESYLATE 10 MG PO TABS
10.0000 mg | ORAL_TABLET | Freq: Every day | ORAL | Status: DC
  Filled 2015-01-13 (×2): qty 1

## 2015-01-13 MED ORDER — HEPARIN (PORCINE) IN NACL 100-0.45 UNIT/ML-% IJ SOLN
1000.0000 [IU]/h | INTRAMUSCULAR | Status: DC
  Filled 2015-01-13: qty 250

## 2015-01-13 MED ORDER — DOXERCALCIFEROL 4 MCG/2ML IV SOLN
8.0000 ug | INTRAVENOUS | Status: DC
  Administered 2015-01-14: 8 ug via INTRAVENOUS

## 2015-01-13 NOTE — H&P (Addendum)
[facility] History and Physical  Holly Mendoza ZOX:[id number] DOB: 01-Nov-1970 DOA: 44/27/2016  Referring physician: Dr Devoria Albe PCP: Dorrene German, MD   Chief Complaint:  Chest pain x 4 days  Primary nephrologist: Dr Abel Presto Primary gastroenterologist: Dr Rhea Belton  HPI:  44 year old African-American female with history of end-stage renal disease on dialysis (M,W,F),history of failed kidney transplant pulmonary hypertension, anemia of chronic kidney disease, recurrent ascites for which she gets frequent large volume paracentesis (at [facility] radiology) and hypertension who presented to the ED with 4 day history of chest pain. Patient had a clotted left femoral AV graft which was declotted by Dr. Edilia Bo on 3/23 and subsequently underwent dialysis the same day. she missed her dialysis on 3/25. On 3/24 she add routine the liver US ( elastography) to r/o liver fibrosis but due to ascites she underwent large volume paracentesis with 4.7 L bloody ascites fluid removed. Patient reports that since that day she was having off and on substernal chest pain, squeezing in nature, 8/10 in severity lasting for almost 5 minutes and self subsiding. She reports associated nausea and heartburn-like symptom. Since the past 2 days she has been having ongoing nausea with 3-4 episodes of brownish emesis.she also reports associated dry cough.  Patient mentions that On 3/23 when she had her last dialysis she had some chest pain and her HR increased to 140s and was given some medicine through IV to control her heart rate. She denies any associated shortness of breath, orthopnea or PND She reports having similar chest pain one year ago and having an inconclusive stress test in [place] ( where she lived prior to moving here). She was also admitted in July 2015 with atypical chest pain thought to be associated with her pulmonary hypertension and fluid overload. Patient denies headache, dizziness, fever,  chills, palpitations,  abdominal pain,or diarrhea. Denies change in weight or appetite.  Course in the ED Patient's vitals were stable. Her O2 sat was normal on room air. Blood work done showed wbc of 15.6k, hemoglobin of 9.6, platelets of 358. Chemistry showed sodium of 135, Desyrel 5.3, BUN of 100 and creatinine of 14.1. Appointment was elevated to 0.11 and 0.15. Chest x-ray showed cardiomegaly with chronic right pleural effusion. Patient was given 50 mg IV fentanyl and nitroglycerin ointment after which her pain improved to 4/10. Cardiology Dr. Rennis Golden was consulted who recommended admission to hospitalist service and would consult. Patient admitted to telemetry.   Review of Systems:  Constitutional: Denies fever, chills, diaphoresis, appetite change and fatigue.  HEENT: Denies visual or hearing symptoms, congestion,sore  throat, difficulty swallowing,neck pain or stiffness Respiratory: cough, chest tightness,Denies SOB, DOE, cough, chest tightness,  and wheezing.   Cardiovascular: chest pain, denies palpitations and leg swelling.  Gastrointestinal: nausea, vomiting, denies abdominal pain, diarrhea, constipation, blood in stool  abdominal distention+.  Genitourinary: anuric Endocrine: Denies: hot or cold intolerance, sweats, Musculoskeletal: Denies myalgias, back pain, joint swelling, arthralgias and gait problem.  Skin: Denies , rash and wound.  Neurological: Denies dizziness, seizures, syncope, weakness, light-headedness, numbness and headaches.  Hematological: Denies adenopathy. Psychiatric/Behavioral: Denies confusion  Past Medical History  Diagnosis Date  . 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 Hypertension   . ESRD on hemodialysis 04/20/2014    Patient started HD in 1998.  She has been dialyzed in [place] at "[facility]" until moving to [place] in July 2015 to live with family.  She has had failed accesses in the  L arm.  No attempt to place  access was made in the R arm, patient is not sure why.  She has a L thigh AVG which she says has been functional for 7-8 years.  They stopped giving her heparin several years ago due to prolonged access bleeding.    Marland Kitchen Heart murmur   . Anemia of chronic disease   . History of blood transfusion     "several; w/transplant, infections, etc"  . Headache(784.0)     "often; sometimes daily" (04/26/2014)  . Migraines     "qod" (04/26/2014)  . ESRD on hemodialysis 04/20/2014    Patient started HD in 1998.  She has been dialyzed in [place] at "[facility]" until moving to [place] in July 2015 to live with family.  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  She has had failed accesses in the L arm.  No attempt to place access was made in the R arm, patient is not sure why.  She has a L thigh AVG which she says has been functional for  . Ascites   . Pulmonary hypertension   . Ascites    Past Surgical History  Procedure Laterality Date  . Arteriovenous graft placement Left 2013    thigh  . 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 Thoracentesis  last done ~ 2 weeks ago  . Thrombectomy and revision of arterioventous (av) goretex  graft Left 01/08/2015    Procedure: THROMBECTOMY AND REVISION OF ARTERIOVENTOUS (AV) THIGH GORETEX  GRAFT;  Surgeon: Chuck Hint, MD;  Location: [facility] OR;  Service: Vascular;  Laterality: Left;   Social History:  reports that she has never smoked. She has never used smokeless tobacco. She reports that she does not drink alcohol or use illicit drugs.  Allergies  Allergen Reactions  . Contrast Media [Iodinated Diagnostic Agents] Anaphylaxis  . Procardia [Nifedipine] Other (See Comments)    Sweating, BP drops dangerously low,  . Vancomycin Anaphylaxis and Hives    Family History  Problem Relation Age of Onset  . Healthy Mother   . Liver disease Maternal Uncle     drinker  . Asthma  Maternal Grandmother     Prior to Admission medications   Medication Sig Start Date End Date Taking? Authorizing Provider  acetaminophen (TYLENOL) 500 MG tablet Take 500 mg by mouth every 6 (six) hours as needed for moderate pain.    Historical Provider, MD  amLODipine (NORVASC) 10 MG tablet Take 1 tablet (10 mg total) by mouth daily. 09/11/14   Dolores Patty, MD  aspirin EC 81 MG tablet Take 1 tablet (81 mg total) by mouth daily. 09/01/14   Richarda Overlie, MD  calcium acetate (PHOSLO) 667 MG capsule Take 667 mg by mouth 3 (three) times daily with meals.     Historical Provider, MD  ciprofloxacin (CIPRO) 500 MG tablet Take 1 tablet (500 mg total) by mouth daily with breakfast. Patient not taking: Reported on 01/08/2015 12/21/14   Christiane Ha, MD  cloNIDine (CATAPRES) 0.3 MG tablet Take 0.3 mg by mouth 2 (two) times daily.    Historical Provider, MD  esomeprazole (NEXIUM) 40 MG capsule Take 1 capsule (40 mg total) by mouth daily at 12 noon. 09/06/14   Richarda Overlie, MD  oxyCODONE (OXY IR/ROXICODONE) 5 MG immediate release tablet Take 1-2 tablets (5-10 mg total) by mouth every 4 (four) hours  as needed for moderate pain. 01/09/15   Lars Mage, PA-C  Skin Protectants, Misc. (EUCERIN) cream Apply 1 application topically daily as needed for dry skin.     Historical Provider, MD  traMADol (ULTRAM) 50 MG tablet Take 1 tablet (50 mg total) by mouth every 12 (twelve) hours as needed for severe pain. 12/21/14   Christiane Ha, MD     Physical Exam:  Filed Vitals:   01/13/15 0645 01/13/15 0700 01/13/15 0715 01/13/15 0730  BP: 142/87 144/86 137/85 145/91  Pulse: 105 97 96 101  Temp:      TempSrc:      Resp: 16 17 16 21   Height:      Weight:      SpO2: 100% 98% 99% 100%    Constitutional: Vital signs reviewed.  Middle aged female lying in bed in no acute distress HEENT: pallor present, no icterus, moist oral mucosa, no cervical lymphadenopathy Cardiovascular: RRR, S1 normal, S2  normal, systolic murmur 3/6 Chest:  basilar fine crackles, no rhonchi or wheeze Abdominal: Soft. Distended with ascites,Non-tender,bowel sounds present Ext: warm, no edema, left femoral AV graft with good thrill Neurological:alert and oriented  Labs on Admission:  Basic Metabolic Panel:  Recent Labs Lab 01/08/15 1351 01/08/15 2218 01/09/15 0805 01/13/15 0150  NA 132* 135 132* 135  K 5.6* 5.8* 6.0* 5.3*  CL  --  98 97 93*  CO2  --  22 20 24   GLUCOSE 127* 159* 94 107*  BUN  --  111* 115* 100*  CREATININE  --  14.70* 14.17* 14.17*  CALCIUM  --  8.0* 8.0* 8.0*  PHOS  --   --  12.7*  --    Liver Function Tests:  Recent Labs Lab 01/08/15 2218 01/09/15 0805  AST 15  --   ALT 13  --   ALKPHOS 157*  --   BILITOT 0.5  --   PROT 5.2*  --   ALBUMIN 1.6* 1.5*   No results for input(s): LIPASE, AMYLASE in the last 168 hours. No results for input(s): AMMONIA in the last 168 hours. CBC:  Recent Labs Lab 01/08/15 1351 01/08/15 2218 01/13/15 0150  WBC  --  12.5* 15.6*  NEUTROABS  --   --  13.0*  HGB 11.2* 9.5* 9.6*  HCT 33.0* 29.1* 30.2*  MCV  --  86.1 87.0  PLT  --  291 358   Cardiac Enzymes:  Recent Labs Lab 01/13/15 0150 01/13/15 0545  TROPONINI 0.11* 0.15*   BNP: Invalid input(s): POCBNP CBG: No results for input(s): GLUCAP in the last 168 hours.  Radiological Exams on Admission: Dg Chest 2 View  01/13/2015   CLINICAL DATA:  Chest pain, vomiting.  EXAM: CHEST  2 VIEW  COMPARISON:  12/19/2014  FINDINGS: Cardiomegaly is unchanged. Small right pleural effusion is unchanged. Vascular congestion again seen. There is no consolidation or pneumothorax. No acute osseous abnormalities.  IMPRESSION: Unchanged cardiomegaly and chronic right pleural effusion.   Electronically Signed   By: Rubye Oaks M.D.   On: 01/13/2015 02:41    EKG: sinus tachycardia at 102, white QRS, RBBB, prolonged QTC of 586  Assessment/Plan Principal Problem:   Chest pain Admit to  telemetry. Patient has both  typical and atypical component of chest pain. ( underlying GERD with N/V seems mainly  contributing to her symptoms. Will order full dose aspirin . S/l nitrate prn. Low-dose Dilaudid when necessary for severe pain. -Monitor serial troponins and EKG. Will hold off on anticoagulation  unless decided by cardiology. Patient reports an inconclusive stress test one year back. Check 2D echo to evaluate LV fn, PAP and any wall motion abnormality.    Active Problems: End-stage renal disease on dialysis Has HD on M,W,F . Missed  Dialysis on 3/25. Has mild volume overload. Will notify nephrology. Continue calcitriol. Recent thrombectomy and revision of left thigh AV graft done on 3/23.    Troponin level elevated Again , pt has both typical and atypical symptoms. Will cycle troponins. cardiology to evaluate.      Prolonged Q-T interval on ECG Seen on prior EKGs, check Mg and replenish.   Cirrhosis of liver with recurrent ascites She has started following with Dr. Sharla Kidney with low lower GI. No clear cause for her cirrhosis, including negative hepatic serologies, normal IgG and ANA. She does have pulmonary hypertension which contributed to cardiac cirrhosis. She has been planned on EGD to r/o varices. She recently had liver elastography and doppler to evaluate portal vein which showed no occlusion or thrombus of the portal vein. Also showed cirrhotic changes with no focal hepatic lesion with high risk for liver fibrosis. Patient gets large-volume paracentesis every 1-2 weeks at [facility] radiology. ( reports getting LVP for several years) . None to liver enzymes. Check stool for occult blood given complain of vomiting with?Hematemesis. If positive we'll consult GI. H&H is currently stable at baseline.   GERD with ? Acute gastritis Will place on IV protonix BID . Add maalox. N/V could be related to uremia. Prn zofran  Recurrent ascites As outlined above. Does not  need paracentesis at this time.no clear explanation of bloody large volume paracentesis on 3/24. Ascites fluid study and cultures were unremarkable otherwise.    Anemia of renal disease [place] at baseline. Check stool for occult blood.    Hyperkalemia k of 5.3 . No EKG changes. Will hold off kayexalate if pt getting HD today.   Essential hypertension Resume home blood pressure medications.  Leukocytosis No clinical signs of infection at this time. Monitor for now.  Pulmonary hypertension PAP of 45 mmhg per last echo in 04/2014. Now following with Dr Gala Romney. Cardiac cirrhosis suspected given rt sided heart failure noted on echo.  Diet:clear liquids  DVT prophylaxis: sq Heparin   Code Status: full code Family Communication: None at bedside Disposition Plan: admit to telemetry  Khristi Schiller, [facility] Pager [phone]  Total time spent on admission :70 minutes  If 7PM-7AM, please contact night-coverage [email or web address] Password [email or web address] 01/13/2015, 8:16 AM

## 2015-01-13 NOTE — ED Provider Notes (Signed)
CSN: [id number]     Arrival date & time 01/13/15  0117 History   First MD Initiated Contact with Patient 01/13/15 [phone]     Chief Complaint  Patient presents with  . Chest Pain   Patient is a 44 y.o. female presenting with chest pain. The history is provided by the patient. No language interpreter was used.  Chest Pain Associated symptoms: shortness of breath and vomiting    This chart was scribed for Eddie North, MD by Andrew Au, ED Scribe. This patient was seen in room A08C/A08C and the patient's care was started at 7:43 AM.  HPI Comments:  Holly Mendoza is a 44 y.o. female who present to the Emergency Department complaining of CP that began 4 days ago.  Pt was recently  had a paracentesis done 3/24. She states they took off 4.6 L and the fluid was bloody which was unusual. She has been having paracentesis done on a regular basis for at least 2 years. Pt states she began feeling bad with dizziness, shakiness, difficulty seeing and emesis 3 night ago after she had the paracentesis. . Pt was supposed to have dialysis 2 days ago on Friday but she over slept; she last went to dialysis 4 days ago. She reports she rested the entire day due to feeling tired. Pt reports she had 3 episode of emesis 1 day ago and states her last episode of emesis consisted of black blood. Pt denies her stools being dark. She states she's only had one bowel movement this week.  She reports CP started 4 days ago during diaylisi. She reports she was given medication during dialysis Wednesday to lower heat rate since it was up 140. She states that is when her chest pain started. Pt states this was her first time trying the medication and unable recall the name . She reports associated SOB. She denies similar CP in the past.  Pt had revision to the dialysis graft in her left leg Tuesday.   Pt has been on dialysis for 15 year.     PCP- Dorrene German, MD  Pt is seen a [facility].   Past Medical History  Diagnosis  Date  . 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 Hypertension   . ESRD on hemodialysis 04/20/2014    Patient started HD in 1998.  She has been dialyzed in [place] at "[facility]" until moving to [place] in July 2015 to live with family.  She has had failed accesses in the L arm.  No attempt to place access was made in the R arm, patient is not sure why.  She has a L thigh AVG which she says has been functional for 7-8 years.  They stopped giving her heparin several years ago due to prolonged access bleeding.    Marland Kitchen Heart murmur   . Anemia of chronic disease   . History of blood transfusion     "several; w/transplant, infections, etc"  . Headache(784.0)     "often; sometimes daily" (04/26/2014)  . Migraines     "qod" (04/26/2014)  . ESRD on hemodialysis 04/20/2014    Patient started HD in 1998.  She has been dialyzed in [place] at "[facility]" until moving to [place] in July 2015 to live with family.  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  She has had failed accesses in the L arm.  No attempt to place access  was made in the R arm, patient is not sure why.  She has a L thigh AVG which she says has been functional for  . Ascites   . Pulmonary hypertension   . Ascites    Past Surgical History  Procedure Laterality Date  . Arteriovenous graft placement Left 2013    thigh  . 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 Thoracentesis  last done ~ 2 weeks ago  . Thrombectomy and revision of arterioventous (av) goretex  graft Left 01/08/2015    Procedure: THROMBECTOMY AND REVISION OF ARTERIOVENTOUS (AV) THIGH GORETEX  GRAFT;  Surgeon: Chuck Hint, MD;  Location: [facility] OR;  Service: Vascular;  Laterality: Left;   Family History  Problem Relation Age of Onset  . Healthy Mother   . Liver disease Maternal Uncle     drinker  . Asthma Maternal Grandmother    History   Substance Use Topics  . Smoking status: Never Smoker   . Smokeless tobacco: Never Used  . Alcohol Use: No   Lives at home  OB History    No data available     Review of Systems  Respiratory: Positive for shortness of breath.   Cardiovascular: Positive for chest pain.  Gastrointestinal: Positive for vomiting.      Allergies  Contrast media; Procardia; and Vancomycin  Home Medications   Prior to Admission medications   Medication Sig Start Date End Date Taking? Authorizing Provider  acetaminophen (TYLENOL) 500 MG tablet Take 500 mg by mouth every 6 (six) hours as needed for moderate pain.    Historical Provider, MD  amLODipine (NORVASC) 10 MG tablet Take 1 tablet (10 mg total) by mouth daily. 09/11/14   Dolores Patty, MD  aspirin EC 81 MG tablet Take 1 tablet (81 mg total) by mouth daily. 09/01/14   Richarda Overlie, MD  calcium acetate (PHOSLO) 667 MG capsule Take 667 mg by mouth 3 (three) times daily with meals.     Historical Provider, MD  ciprofloxacin (CIPRO) 500 MG tablet Take 1 tablet (500 mg total) by mouth daily with breakfast. Patient not taking: Reported on 01/08/2015 12/21/14   Christiane Ha, MD  cloNIDine (CATAPRES) 0.3 MG tablet Take 0.3 mg by mouth 2 (two) times daily.    Historical Provider, MD  esomeprazole (NEXIUM) 40 MG capsule Take 1 capsule (40 mg total) by mouth daily at 12 noon. 09/06/14   Richarda Overlie, MD  oxyCODONE (OXY IR/ROXICODONE) 5 MG immediate release tablet Take 1-2 tablets (5-10 mg total) by mouth every 4 (four) hours as needed for moderate pain. 01/09/15   Lars Mage, PA-C  Skin Protectants, Misc. (EUCERIN) cream Apply 1 application topically daily as needed for dry skin.     Historical Provider, MD  traMADol (ULTRAM) 50 MG tablet Take 1 tablet (50 mg total) by mouth every 12 (twelve) hours as needed for severe pain. 12/21/14   Christiane Ha, MD   BP 166/87 mmHg  Pulse 55  Temp(Src) 97.5 F (36.4 C) (Oral)  Resp 18  Ht 5\' 9"  (1.753  m)  Wt 180 lb (81.647 kg)  BMI 26.57 kg/m2  SpO2 100%  Vital signs normal except for bradycardia and hypertension  Physical Exam  Constitutional: She is oriented to person, place, and time. She appears well-developed and well-nourished.  Non-toxic appearance. She does not appear ill. No distress.  HENT:  Head: Normocephalic and atraumatic.  Right Ear:  External ear normal.  Left Ear: External ear normal.  Nose: Nose normal. No mucosal edema or rhinorrhea.  Mouth/Throat: Oropharynx is clear and moist and mucous membranes are normal. No dental abscesses or uvula swelling.  puffiness of upper eyelids. Tongue is dry   Eyes: Conjunctivae and EOM are normal. Pupils are equal, round, and reactive to light.  conjuctiva are pale   Neck: Normal range of motion and full passive range of motion without pain. Neck supple.  Cardiovascular: Normal rate and regular rhythm.  Exam reveals gallop. Exam reveals no friction rub.   Murmur ( systolic) heard. Pulmonary/Chest: Effort normal and breath sounds normal. No respiratory distress. She has no wheezes. She has no rhonchi. She has no rales. She exhibits no tenderness and no crepitus.  Abdominal: Soft. Normal appearance and bowel sounds are normal. She exhibits no distension. There is no tenderness. There is no rebound and no guarding.  Musculoskeletal: Normal range of motion. She exhibits no edema or tenderness.  Moves all extremities well.   Neurological: She is alert and oriented to person, place, and time. She has normal strength. No cranial nerve deficit.  Skin: Skin is warm, dry and intact. No rash noted. No erythema. No pallor.  Psychiatric: She has a normal mood and affect. Her speech is normal and behavior is normal. Her mood appears not anxious.  Nursing note and vitals reviewed.   ED Course  Procedures (including critical care time)  Medications  nitroGLYCERIN (NITROGLYN) 2 % ointment 1 inch (1 inch Topical Not Given 01/13/15 0612)   fentaNYL (SUBLIMAZE) injection 50 mcg (50 mcg Intravenous Given 01/13/15 0728)    DIAGNOSTIC STUDIES: Oxygen Saturation is 100% on RA, normal by my interpretation.    COORDINATION OF CARE: - Pt advised of plan for treatment and pt agrees.  Nitroglycerin paste was ordered however patient states she cannot take nitroglycerin.  Patient was given the results of her tests. Her troponin is positive today and usually it is not positive. Repeat troponin also was positive and mildly increased from the first one. Patient was given fentanyl for pain. She states she has seen Dr. Jones Broom many years ago.  07:23 Dr Rennis Golden, cardiology, states he will consult on patient and will recommend further testing, have hospitalist admit  07:41 Dr Gonzella Lex, admit to tele  Labs Review Results for orders placed or performed during the hospital encounter of 01/13/15  Basic metabolic panel  Result Value Ref Range   Sodium 135 135 - 145 mmol/L   Potassium 5.3 (H) 3.5 - 5.1 mmol/L   Chloride 93 (L) 96 - 112 mmol/L   CO2 24 19 - 32 mmol/L   Glucose, Bld 107 (H) 70 - 99 mg/dL   BUN 161 (H) 6 - 23 mg/dL   Creatinine, Ser 09.60 (H) 0.50 - 1.10 mg/dL   Calcium 8.0 (L) 8.4 - 10.5 mg/dL   GFR calc non Af Amer 3 (L) >90 mL/min   GFR calc Af Amer 3 (L) >90 mL/min   Anion gap 18 (H) 5 - 15  BNP (order ONLY if patient complains of dyspnea/SOB AND you have documented it for THIS visit)  Result Value Ref Range   B Natriuretic Peptide 2116.2 (H) 0.0 - 100.0 pg/mL  CBC with Differential  Result Value Ref Range   WBC 15.6 (H) 4.0 - 10.5 K/uL   RBC 3.47 (L) 3.87 - 5.11 MIL/uL   Hemoglobin 9.6 (L) 12.0 - 15.0 g/dL   HCT 45.4 (L) 09.8 - 11.9 %  MCV 87.0 78.0 - 100.0 fL   MCH 27.7 26.0 - 34.0 pg   MCHC 31.8 30.0 - 36.0 g/dL   RDW 16.1 09.6 - 04.5 %   Platelets 358 150 - 400 K/uL   Neutrophils Relative % 83 (H) 43 - 77 %   Neutro Abs 13.0 (H) 1.7 - 7.7 K/uL   Lymphocytes Relative 7 (L) 12 - 46 %   Lymphs Abs 1.1 0.7 -  4.0 K/uL   Monocytes Relative 8 3 - 12 %   Monocytes Absolute 1.2 (H) 0.1 - 1.0 K/uL   Eosinophils Relative 2 0 - 5 %   Eosinophils Absolute 0.2 0.0 - 0.7 K/uL   Basophils Relative 0 0 - 1 %   Basophils Absolute 0.0 0.0 - 0.1 K/uL  Troponin I  Result Value Ref Range   Troponin I 0.11 (H) <0.031 ng/mL  Troponin I  Result Value Ref Range   Troponin I 0.15 (H) <0.031 ng/mL   Laboratory interpretation all normal except elevated troponin, chronic renal failure, mildly elevated potassium, anemia, leukocytosis, elevated BNP   Dg Chest 2 View  01/13/2015   CLINICAL DATA:  Chest pain, vomiting.  EXAM: CHEST  2 VIEW  COMPARISON:  12/19/2014  FINDINGS: Cardiomegaly is unchanged. Small right pleural effusion is unchanged. Vascular congestion again seen. There is no consolidation or pneumothorax. No acute osseous abnormalities.  IMPRESSION: Unchanged cardiomegaly and chronic right pleural effusion.   Electronically Signed   By: Rubye Oaks M.D.   On: 01/13/2015 02:41   Ct Abdomen Pelvis Wo Contrast  12/20/2014   CLINICAL DATA:  Right lower abdominal pain, failed renal transplant IMPRESSION: No evidence of bowel obstruction.  Moderate to large volume of pelvic ascites. Body wall edema. Anasarca.  Small right pleural effusion.  Ground-glass opacities in the bilateral lower lobes, incompletely visualized, possibly interstitial edema or less likely infection.   Electronically Signed   By: Charline Bills M.D.   On: 12/20/2014 08:27     [place] Art/ven Flow Abd Pelv Doppler Limited  01/10/2015   CLINICAL DATA:  Cirrhosis of liver with ascites, unspecified hepatic cirrhosis type, Right pleural effusion  .  IMPRESSION: 1. Unremarkable liver vascular Doppler evaluation. 2. Abdominal ascites. 3. Right pleural effusion   Electronically Signed   By: Corlis Leak M.D.   On: 01/10/2015 15:33   US Paracentesis  01/10/2015   INDICATION: ESRD, recurrent ascites and request for paracentesis.  Marland Kitchen  FINDINGS: A total of  approximately 4.7 liters of bloody fluid was removed. Samples were sent to the laboratory as requested by the clinical team.  IMPRESSION: Successful ultrasound-guided paracentesis yielding 4.7 liters of peritoneal fluid.  Read By:  Pattricia Boss PA-C   Electronically Signed   By: Gilmer Mor D.O.   On: 01/10/2015 14:45   US Paracentesis  12/19/2014   INDICATION: Pulmonary artery hypertension, portal hypertension, ESRD, recurrent ascites. Request is made for therapeutic paracentesis.    FINDINGS: A total of approximately 6.3 liters of yellow fluid was removed.  IMPRESSION: Successful ultrasound-guided therapeutic paracentesis yielding 6.3 liters of peritoneal fluid.  Read by: Jeananne Rama, PA-C   Electronically Signed   By: Malachy Moan M.D.   On: 12/19/2014 12:11   Dg Abd Acute W/chest  12/19/2014   CLINICAL DATA:  Abdominal pain   IMPRESSION: 1. No acute findings. 2. Cardiomegaly and chronic small right pleural effusion. 3. Chronic indeterminate calcification in the right upper quadrant.   Electronically Signed   By: Marnee Spring  M.D.   On: 12/19/2014 21:44   US Abdomen Complete W/elastography  01/10/2015   CLINICAL DATA:  Cirrhosis and ascites.  History of renal transplant.IMPRESSION: Cirrhotic changes involving the liver but no focal hepatic lesion  Median hepatic shear wave velocity is calculated at 2.95 m/sec.  Corresponding Metavir fibrosis score is F3/F4.  Risk of fibrosis is high.  Follow-up:  Advised.   Electronically Signed   By: Rudie Meyer M.D.   On: 01/10/2015 15:49        Imaging Review Dg Chest 2 View  01/13/2015   CLINICAL DATA:  Chest pain, vomiting.  EXAM: CHEST  2 VIEW  COMPARISON:  12/19/2014  FINDINGS: Cardiomegaly is unchanged. Small right pleural effusion is unchanged. Vascular congestion again seen. There is no consolidation or pneumothorax. No acute osseous abnormalities.  IMPRESSION: Unchanged cardiomegaly and chronic right pleural effusion.   Electronically  Signed   By: Rubye Oaks M.D.   On: 01/13/2015 02:41     EKG Interpretation  #1 Date/Time:  Sunday January 13 2015 01:24:39 EDT Ventricular Rate:  106 PR Interval:  128 QRS Duration: 126 QT Interval:  420 QTC Calculation: 557 R Axis:   53 Text Interpretation:   Critical Test Result: Long QTc Sinus tachycardia  Non-specific intra-ventricular conduction block Nonspecific T wave  abnormality No significant change since last tracing 19 Dec 2014 Confirmed  by Micheal Sheen  MD-I, Janely Gullickson ([id number]) on 01/13/2015 2:13:51 AM   #2  EKG Interpretation  Date/Time:  Sunday January 13 2015 01:26:01 EDT Ventricular Rate:  102 PR Interval:  128 QRS Duration: 132 QT Interval:  450 QTC Calculation: 586 R Axis:   54 Text Interpretation:  Wide QRS rhythm Right bundle branch block No significant change since last tracing EARLIER SAME DATE Confirmed by Cleotilde Spadaccini  MD-I, Kailyn Dubie ([id number]) on 01/13/2015 3:58:53 AM          MDM   Final diagnoses:  Chest pain, unspecified chest pain type  Troponin level elevated  ESRD on hemodialysis    Plan admission  Devoria Albe, MD, FACEP   I personally performed the services described in this documentation, which was scribed in my presence. The recorded information has been reviewed and considered.  Devoria Albe, MD, Concha Pyo, MD 01/13/15 [phone]

## 2015-01-13 NOTE — Consult Note (Signed)
[facility] Renal Consultation Note    Indication for Consultation:  Management of ESRD/hemodialysis; anemia, hypertension/volume and secondary hyperparathyroidism PCP:  HPI: Holly Mendoza is a 44 y.o. female with ESRD secondary to HTN on HD sice 11/1994 withh hx of failed kidney transplant 2012(only had for 6 months).  She started with [facility] August of 2015 after an unplanned move from [place] in July 2015. Her PMHx is complicated by sevre pulmonary HTN, severe ascites, uncontrolled secondary hyperparathyroidism and nonadherence to her dialysis prescription with frequent no shows and when she does attend, her treatments are inadequate because she insists on signing off early. She recently had a thrombectomy and revision of her left thigh graft 3/22 by Dr. Edilia Bo. Her last HD treatment was 3/23 at [facility].  She was discharged home and returned foroutpatient paracentesis 3/24 when she had 4.7 L bloody fluid removed (not usually bloody per pt). Paracentesis was stopped due to pain. She did not come to her usual dialysis treatment 3/25 due to oversleeping from returning home late from her paracentesis..  She presented to the ED today with complaint of CP that begain four days ago. She has had N and V -no diarrhea or fever since paracentesis and hasn't been able to keep any food, including jello down.  She is anxious about seeing blood in the paracentesis but doesn't have abdominal pain like earlier this month when she was given cipro (for possible SBP). She described her CP as like something was stuck and stabbing, but it is better now.  She has no SOB, she normally sleeps on a lot of pillows. The swelling in her legs is much better post paracentesis.  She had seen Dr. Marshell Garfinkel 2/23 for evaluation/workup of ? Cirrhosis and ascites. 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  Consequently she has been on no  heparin HD since dialyzing in [place].  Chart review: 6/15 - moved to area w/o setting up dialysis first, admitted for high K, vol overload. Rec'd HD and home meds, dc'd home.   7/15 - no outpatient dialysis center due to behavior issues and noncompliance; admitted for vol overload, high K, need for HD.  Also hx of ascites, requires paracentesis from time to time. SW could not secure dialysis placement for the patient.  DC'd home.  11/15 - 72 yo hx ESRD, failed tx from 2012, HTN, ascites requiring weekly paracentesis (when she lived in [place]).  Pulm HTN also.  Admitted for chest pain.  Had liver US in [place] neg for cirrhosis per pt. V/Q scan done at request of cardiologist.  F/U OP cardiology.  3/2- 12/21/14 > paracentesis compilcated by N/V; missed her HD x 1.  K 7.2. Pain generalized.  Admitted and rec'd HD with correction of high K.  IV abx in case of SBP. CT abd showed no obstruction, just anasarca, ascites.  Pt dc'd home.   Past Medical History  Diagnosis Date  . 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 Hypertension   . ESRD on hemodialysis 04/20/2014    Patient started HD in 1998.  She has been dialyzed in [place] at "[facility]" until moving to [place] in July 2015 to live with family.  She has had failed accesses in the L arm.  No attempt to place access was made in the R arm, patient is not sure why.  She has a  L thigh AVG which she says has been functional for 7-8 years.  They stopped giving her heparin several years ago due to prolonged access bleeding.    Marland Kitchen Heart murmur   . Anemia of chronic disease   . History of blood transfusion     "several; w/transplant, infections, etc"  . Headache(784.0)     "often; sometimes daily" (04/26/2014)  . Migraines     "qod" (04/26/2014)  . ESRD on hemodialysis 04/20/2014    Patient started HD in 1998.  She has been dialyzed in [place] at "[facility]" until moving to [place] in July 2015 to live with family.  She  had a transplant in 2012 which didn't take and it was removed in 2013.  She has had failed accesses in the L arm.  No attempt to place access was made in the R arm, patient is not sure why.  She has a L thigh AVG which she says has been functional for  . Ascites   . Pulmonary hypertension   . Ascites    Past Surgical History  Procedure Laterality Date  . Arteriovenous graft placement Left 2013    thigh  . 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 Thoracentesis  last done ~ 2 weeks ago  . Thrombectomy and revision of arterioventous (av) goretex  graft Left 01/08/2015    Procedure: THROMBECTOMY AND REVISION OF ARTERIOVENTOUS (AV) THIGH GORETEX  GRAFT;  Surgeon: Chuck Hint, MD;  Location: [facility] OR;  Service: Vascular;  Laterality: Left;   Family History  Problem Relation Age of Onset  . Healthy Mother   . Liver disease Maternal Uncle     drinker  . Asthma Maternal Grandmother    Social History:  reports that she has never smoked. She has never used smokeless tobacco. She reports that she does not drink alcohol or use illicit drugs. Allergies  Allergen Reactions  . Contrast Media [Iodinated Diagnostic Agents] Anaphylaxis  . Procardia [Nifedipine] Other (See Comments)    Sweating, BP drops dangerously low,  . Vancomycin Anaphylaxis and Hives   Prior to Admission medications   Medication Sig Start Date End Date Taking? Authorizing Provider  amLODipine (NORVASC) 10 MG tablet Take 1 tablet (10 mg total) by mouth daily. 09/11/14  Yes Dolores Patty, MD  aspirin EC 81 MG tablet Take 1 tablet (81 mg total) by mouth daily. 09/01/14  Yes Richarda Overlie, MD  calcium acetate (PHOSLO) 667 MG capsule Take 667 mg by mouth 3 (three) times daily with meals.    Yes Historical Provider, MD  cloNIDine (CATAPRES) 0.3 MG tablet Take 0.3 mg by mouth 2 (two) times daily.   Yes Historical Provider, MD  esomeprazole (NEXIUM) 40 MG capsule Take 1 capsule  (40 mg total) by mouth daily at 12 noon. 09/06/14  Yes Richarda Overlie, MD  Skin Protectants, Misc. (EUCERIN) cream Apply 1 application topically daily as needed for dry skin.    Yes Historical Provider, MD  traMADol (ULTRAM) 50 MG tablet Take 1 tablet (50 mg total) by mouth every 12 (twelve) hours as needed for severe pain. 12/21/14  Yes Christiane Ha, MD  zolpidem (AMBIEN) 5 MG tablet Take 5 mg by mouth at bedtime as needed. 12/27/14  Yes Historical Provider, MD  acetaminophen (TYLENOL) 500 MG tablet Take 500 mg by mouth every 6 (six) hours as needed for moderate pain.    Historical Provider, MD  ciprofloxacin (CIPRO)  500 MG tablet Take 1 tablet (500 mg total) by mouth daily with breakfast. Patient not taking: Reported on 01/08/2015 12/21/14   Christiane Ha, MD  oxyCODONE (OXY IR/ROXICODONE) 5 MG immediate release tablet Take 1-2 tablets (5-10 mg total) by mouth every 4 (four) hours as needed for moderate pain. Patient not taking: Reported on 01/13/2015 01/09/15   Lars Mage, PA-C   Current Facility-Administered Medications  Medication Dose Route Frequency Provider Last Rate Last Dose  . acetaminophen (TYLENOL) tablet 500 mg  500 mg Oral Q6H PRN Nishant Dhungel, MD      . alum & mag hydroxide-simeth (MAALOX/MYLANTA) 200-200-20 MG/5ML suspension 15 mL  15 mL Oral Q6H PRN Nishant Dhungel, MD      . amLODipine (NORVASC) tablet 10 mg  10 mg Oral Daily Nishant Dhungel, MD      . Melene Muller ON 01/14/2015] aspirin EC tablet 81 mg  81 mg Oral Daily Nishant Dhungel, MD      . aspirin tablet 325 mg  325 mg Oral Daily Nishant Dhungel, MD      . calcium acetate (PHOSLO) capsule 667 mg  667 mg Oral TID WC Nishant Dhungel, MD      . cloNIDine (CATAPRES) tablet 0.3 mg  0.3 mg Oral BID Nishant Dhungel, MD      . heparin ADULT infusion 100 units/mL (25000 units/250 mL)  1,000 Units/hr Intravenous Continuous Gerilyn Nestle, RPH      . heparin bolus via infusion 3,500 Units  3,500 Units Intravenous Once Thuy D Dang,  RPH      . hydrocerin (EUCERIN) cream 1 application  1 application Topical Daily PRN Nishant Dhungel, MD      . HYDROmorphone (DILAUDID) injection 0.5 mg  0.5 mg Intravenous Q4H PRN Nishant Dhungel, MD      . methylPREDNISolone sodium succinate (SOLU-MEDROL) 125 mg/2 mL injection 125 mg  125 mg Intravenous Once Dolores Patty, MD      . nitroGLYCERIN (NITROGLYN) 2 % ointment 1 inch  1 inch Topical Once Devoria Albe, MD   1 inch at 01/13/15 0612  . nitroGLYCERIN (NITROSTAT) SL tablet 0.4 mg  0.4 mg Sublingual Q5 min PRN Nishant Dhungel, MD      . ondansetron (ZOFRAN) tablet 4 mg  4 mg Oral Q6H PRN Nishant Dhungel, MD       Or  . ondansetron (ZOFRAN) injection 4 mg  4 mg Intravenous Q6H PRN Nishant Dhungel, MD      . ondansetron (ZOFRAN) injection 4 mg  4 mg Intravenous Once Eddie North, MD   Stopped at 01/13/15 1610  . oxyCODONE (Oxy IR/ROXICODONE) immediate release tablet 5-10 mg  5-10 mg Oral Q4H PRN Nishant Dhungel, MD      . pantoprazole (PROTONIX) injection 40 mg  40 mg Intravenous Q12H Nishant Dhungel, MD      . sodium chloride 0.9 % injection 3 mL  3 mL Intravenous Q12H Nishant Dhungel, MD      . traMADol (ULTRAM) tablet 50 mg  50 mg Oral Q12H PRN Nishant Dhungel, MD       Labs: Basic Metabolic Panel:  Recent Labs Lab 01/08/15 2218 01/09/15 0805 01/13/15 0150  NA 135 132* 135  K 5.8* 6.0* 5.3*  CL 98 97 93*  CO2 GLUCOSE 159* 94 107*  BUN 111* 115* 100*  CREATININE 14.70* 14.17* 14.17*  CALCIUM 8.0* 8.0* 8.0*  PHOS  --  12.7*  --    Liver Function Tests:  Recent  Labs Lab 01/08/15 2218 01/09/15 0805  AST 15  --   ALT 13  --   ALKPHOS 157*  --   BILITOT 0.5  --   PROT 5.2*  --   ALBUMIN 1.6* 1.5*   CBC:  Recent Labs Lab 01/08/15 1351 01/08/15 2218 01/13/15 0150  WBC  --  12.5* 15.6*  NEUTROABS  --   --  13.0*  HGB 11.2* 9.5* 9.6*  HCT 33.0* 29.1* 30.2*  MCV  --  86.1 87.0  PLT  --  291 358   Cardiac Enzymes:  Recent Labs Lab  01/13/15 0150 01/13/15 0545  TROPONINI 0.11* 0.15*  Studies/Results: Dg Chest 2 View  01/13/2015   CLINICAL DATA:  Chest pain, vomiting.  EXAM: CHEST  2 VIEW  COMPARISON:  12/19/2014  FINDINGS: Cardiomegaly is unchanged. Small right pleural effusion is unchanged. Vascular congestion again seen. There is no consolidation or pneumothorax. No acute osseous abnormalities.  IMPRESSION: Unchanged cardiomegaly and chronic right pleural effusion.   Electronically Signed   By: Rubye Oaks M.D.   On: 01/13/2015 02:41   ROS: As per HPI otherwise negative.  Physical Exam: Filed Vitals:   01/13/15 0715 01/13/15 0730 01/13/15 0815 01/13/15 0948  BP: 137/85 145/91 145/89 130/76  Pulse: 96 101 96 94  Temp:      TempSrc:      Resp: 16 21 17 16   Height:      Weight:      SpO2: 99% 100% 99% 100%     General:  AA female somewhat anxious, NAD  Head: Normocephalic, atraumatic, sclera non-icteric, mucus membranes are moist, exothalmic with periorbital puffiness Neck: Supple. JVD not elevated. +JVD Lungs: Crackles left base dim BS right base Breathing is unlabored. Back: some dependent edema Heart: RRR 2/6 murmur no rub Abdomen: + ascites quite soft nontender. Lower extremities: tr LE edema or ischemic changes, no open wounds (probably better due to bed rest) Neuro: Alert and oriented X 3. Moves all extremities spontaneously. Psych:  Responds to questions appropriately with a normal affect. Dialysis Access: left thigh AVGG + bruit -dressing in place -   Dialysis Orders: Center: [facility] MWF - 4 hr (however RARELY runs more than 2 and skips multiple appts) 180, EDW 72.5 1 K 2.5 Ca profile 2 left thigh AVGG NO heparin Aranesp 60 per week - last given 3/14 Hectorol 8 venofer 50/week  Recent Labs: ?Hgb 8.2 3/14 iPTH 1347 and P 14.2!!! - last outpt HD treatment 3/18  Assessment/Plan: 1.  Chest pain - ^ BNP, slightly+ troponins, hx of PAH, cards recommends heart cath; ? GI component 2.  ESRD -  MWF - last  HD Wed 3/23 at [facility] - missed Friday dialysis - Mild hyperkalemia - on 1 K bath as an outpt due to inadequate HD from nonadherence to treatments i.e skipping treatments AND shortening treatment - plan HD today- had extensive discussion to help find a way for her to stay on longer - she is actually quite anxious at outpt HD unit and tells me she can't stay on because of pain- which she tells me is in her chest 3.  Hypertension/volume  - difficult to determine true EDW due to ascites and nonadherence to dialysis Rx - continue norvasc/clonidine BP ok - set goal for 3 L in 3 hours today - since she rarely will stay on for even 2 hours - may need HD again on Monday; CXR oon admission showed vasc congestion, right pleural effusion - ^^BNP. She is  aobut 10kg above her dry wt 4. Ascites - prob due to chronic vol overload, diast HF and pulm HTN.   5.  Anemia  - hgb mid9s stable - continue ESA - maintenance Fe - last ESA given 3/14 - resume weekly 6.  Metabolic bone disease -  Continue hectorol - ongoing issues with noncompliance with binders- tells me she takes 2 phoslo AFTER her meals- I suspect not  given Ca/P levels -  phoslo to 2 ac when taking POs 7.  Nutrition - poor - very low Alb - currently NPO  8. Severe chronic ascites/pulmonary hypertension - low alb/noncompliance - recently given cipro for + SBP- earlier this month - recent para WBC 257 afebrile - serum WBC 15.6 K 83% N 9. Chronic nonadherence to dialysis/ESRD related meds -  pt has difficulty seeing her role in contributing to her medical issues 10. Questionable history of HIT - the records we had from [place] - stated "heparin caused low platelets" but didn't see a HIT panel per se; consequently hasn't been receiving heparin with her hemodialysis treatments; discussed with Dr. Rennis Golden to check a HIT panel and put on Angiomax until results back   Sheffield Slider, PA-C [facility] Beeper [phone] 01/13/2015, 11:00 AM   Pt seen,  examined, agree w assess/plan as above with additions as indicated.  Vinson Moselle MD pager [phone]    cell [phone] 01/13/2015, 3:00 PM

## 2015-01-13 NOTE — ED Notes (Signed)
Dr. Knapp at bedside with patient. 

## 2015-01-13 NOTE — ED Notes (Signed)
The pt is c/o chest pain vomiting up blood.   Dark colored since yesterday.  The pt is dialysis graft lt thigh.  She is unsure  When she was diakyzed last.  She has had a recent paracentesis.   She is being worked up for liver problems

## 2015-01-13 NOTE — Consult Note (Signed)
CONSULTATION NOTE  Reason for Consult: Chest pain  Requesting Physician: Dr. Tomi Bamberger  Cardiologist: Dr. Haroldine Laws  HPI: This is a 43 y.o. female with a past medical history significant for end-stage renal disease on hemodialysis, status post failed kidney transplant 2012, hypertension, recurrent ascites for which she receives weekly paracenteses. She was seen by Dr. Haroldine Laws for pulmonary hypertension in September 2015. Echo at the time showed moderate LVH, and estimated RVSP of 45 mmHg. EF is 65-70% with grade 1 diastolic dysfunction. A subsequent note by Dr. Aundra Dubin in December 2015 indicated she was recently admitted to [facility] with chest pain. She ruled out for acute coronary syndrome and had a VQ scan which was low probability for PE. Given her cardiac risk factors a left and right heart catheterization was recommended, however did not see that that was ever performed. Recently she's felt unwell with symptoms of dizziness, shakiness and difficulty in seeing. She's thrown up 3 times after recent paracentesis. She apparently missed dialysis on Friday. She was noted to be tachycardic and her heart rate was up into the 140s. This caused her symptoms of chest pain. EKG now shows a sinus tachycardia with heart rates in the low 100s. She now presents again with chest pain. Labs indicate an elevated BNP of 2116 and mildly elevated troponins of 0.11 and 0.15. There is leukocytosis with a white count of 15,000 and a left shift. She is hyperkalemic with potassium of 5.3. Cardiology is asked to evaluate.  PMHx:  Past Medical History  Diagnosis Date  . 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 Hypertension   . ESRD on hemodialysis 04/20/2014    Patient started HD in 1998.  She has been dialyzed in [place] at "[facility]" until moving to [place] in July 2015 to live with family.  She has had failed accesses in the L arm.  No attempt to place access was made in  the R arm, patient is not sure why.  She has a L thigh AVG which she says has been functional for 7-8 years.  They stopped giving her heparin several years ago due to prolonged access bleeding.    Marland Kitchen Heart murmur   . Anemia of chronic disease   . History of blood transfusion     "several; w/transplant, infections, etc"  . Headache(784.0)     "often; sometimes daily" (04/26/2014)  . Migraines     "qod" (04/26/2014)  . ESRD on hemodialysis 04/20/2014    Patient started HD in 1998.  She has been dialyzed in [place] at "[facility]" until moving to [place] in July 2015 to live with family.  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  She has had failed accesses in the L arm.  No attempt to place access was made in the R arm, patient is not sure why.  She has a L thigh AVG which she says has been functional for  . Ascites   . Pulmonary hypertension   . Ascites    Past Surgical History  Procedure Laterality Date  . Arteriovenous graft placement Left 2013    thigh  . 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 Thoracentesis  last done ~ 2 weeks ago  . Thrombectomy and revision of arterioventous (av) goretex  graft Left 01/08/2015    Procedure: THROMBECTOMY AND REVISION OF  ARTERIOVENTOUS (AV) THIGH GORETEX  GRAFT;  Surgeon: Angelia Mould, MD;  Location: [facility] OR;  Service: Vascular;  Laterality: Left;    FAMHx: Family History  Problem Relation Age of Onset  . Healthy Mother   . Liver disease Maternal Uncle     drinker  . Asthma Maternal Grandmother     SOCHx:  reports that she has never smoked. She has never used smokeless tobacco. She reports that she does not drink alcohol or use illicit drugs.  ALLERGIES: Allergies  Allergen Reactions  . Contrast Media [Iodinated Diagnostic Agents] Anaphylaxis  . Procardia [Nifedipine] Other (See Comments)    Sweating, BP drops dangerously low,  . Vancomycin  Anaphylaxis and Hives    ROS: A comprehensive review of systems was negative except for: Constitutional: positive for anorexia and malaise Cardiovascular: positive for chest pressure/discomfort Gastrointestinal: positive for nausea, reflux symptoms and vomiting  HOME MEDICATIONS:   Medication List    ASK your doctor about these medications        acetaminophen 500 MG tablet  Commonly known as:  TYLENOL  Take 500 mg by mouth every 6 (six) hours as needed for moderate pain.     amLODipine 10 MG tablet  Commonly known as:  NORVASC  Take 1 tablet (10 mg total) by mouth daily.     aspirin EC 81 MG tablet  Take 1 tablet (81 mg total) by mouth daily.     calcium acetate 667 MG capsule  Commonly known as:  PHOSLO  Take 667 mg by mouth 3 (three) times daily with meals.     ciprofloxacin 500 MG tablet  Commonly known as:  CIPRO  Take 1 tablet (500 mg total) by mouth daily with breakfast.     cloNIDine 0.3 MG tablet  Commonly known as:  CATAPRES  Take 0.3 mg by mouth 2 (two) times daily.     esomeprazole 40 MG capsule  Commonly known as:  NEXIUM  Take 1 capsule (40 mg total) by mouth daily at 12 noon.     eucerin cream  Apply 1 application topically daily as needed for dry skin.     oxyCODONE 5 MG immediate release tablet  Commonly known as:  Oxy IR/ROXICODONE  Take 1-2 tablets (5-10 mg total) by mouth every 4 (four) hours as needed for moderate pain.     traMADol 50 MG tablet  Commonly known as:  ULTRAM  Take 1 tablet (50 mg total) by mouth every 12 (twelve) hours as needed for severe pain.     zolpidem 5 MG tablet  Commonly known as:  AMBIEN  Take 5 mg by mouth at bedtime as needed.        HOSPITAL MEDICATIONS: I have reviewed the patient's current medications.  VITALS: Blood pressure 145/91, pulse 101, temperature 97.5 F (36.4 C), temperature source Oral, resp. rate 21, height 5' 9"  (1.753 m), weight 180 lb (81.647 kg), SpO2 100 %.  PHYSICAL EXAM: General  appearance: alert and mild distress Neck: no carotid bruit and no JVD Lungs: diminished breath sounds bilaterally Heart: Regular tachycardia, 3/6 systolic murmur at the apex Abdomen: Mild tender to palpation, no rebound or guarding, protuberant Extremities: No significant edema, AV fistula with notable thrill Pulses: 2+ and symmetric Skin: Warm, dry Neurologic: Grossly normal Psych: Normal  LABS: Results for orders placed or performed during the hospital encounter of 01/13/15 (from the past 48 hour(s))  Basic metabolic panel     Status: Abnormal   Collection Time: 01/13/15  1:50 AM  Result Value Ref Range   Sodium 135 135 - 145 mmol/L   Potassium 5.3 (H) 3.5 - 5.1 mmol/L   Chloride 93 (L) 96 - 112 mmol/L   CO2 24 19 - 32 mmol/L   Glucose, Bld 107 (H) 70 - 99 mg/dL   BUN 100 (H) 6 - 23 mg/dL   Creatinine, Ser 14.17 (H) 0.50 - 1.10 mg/dL   Calcium 8.0 (L) 8.4 - 10.5 mg/dL   GFR calc non Af Amer 3 (L) >90 mL/min   GFR calc Af Amer 3 (L) >90 mL/min    Comment: (NOTE) The eGFR has been calculated using the CKD EPI equation. This calculation has not been validated in all clinical situations. eGFR's persistently <90 mL/min signify possible Chronic Kidney Disease.    Anion gap 18 (H) 5 - 15  CBC with Differential     Status: Abnormal   Collection Time: 01/13/15  1:50 AM  Result Value Ref Range   WBC 15.6 (H) 4.0 - 10.5 K/uL   RBC 3.47 (L) 3.87 - 5.11 MIL/uL   Hemoglobin 9.6 (L) 12.0 - 15.0 g/dL   HCT 30.2 (L) 36.0 - 46.0 %   MCV 87.0 78.0 - 100.0 fL   MCH 27.7 26.0 - 34.0 pg   MCHC 31.8 30.0 - 36.0 g/dL   RDW 14.2 11.5 - 15.5 %   Platelets 358 150 - 400 K/uL   Neutrophils Relative % 83 (H) 43 - 77 %   Neutro Abs 13.0 (H) 1.7 - 7.7 K/uL   Lymphocytes Relative 7 (L) 12 - 46 %   Lymphs Abs 1.1 0.7 - 4.0 K/uL   Monocytes Relative 8 3 - 12 %   Monocytes Absolute 1.2 (H) 0.1 - 1.0 K/uL   Eosinophils Relative 2 0 - 5 %   Eosinophils Absolute 0.2 0.0 - 0.7 K/uL   Basophils  Relative 0 0 - 1 %   Basophils Absolute 0.0 0.0 - 0.1 K/uL  Troponin I     Status: Abnormal   Collection Time: 01/13/15  1:50 AM  Result Value Ref Range   Troponin I 0.11 (H) <0.031 ng/mL    Comment:        PERSISTENTLY INCREASED TROPONIN VALUES IN THE RANGE OF 0.04-0.49 ng/mL CAN BE SEEN IN:       -UNSTABLE ANGINA       -CONGESTIVE HEART FAILURE       -MYOCARDITIS       -CHEST TRAUMA       -ARRYHTHMIAS       -LATE PRESENTING MYOCARDIAL INFARCTION       -COPD   CLINICAL FOLLOW-UP RECOMMENDED.   BNP (order ONLY if patient complains of dyspnea/SOB AND you have documented it for THIS visit)     Status: Abnormal   Collection Time: 01/13/15  1:51 AM  Result Value Ref Range   B Natriuretic Peptide 2116.2 (H) 0.0 - 100.0 pg/mL  Troponin I     Status: Abnormal   Collection Time: 01/13/15  5:45 AM  Result Value Ref Range   Troponin I 0.15 (H) <0.031 ng/mL    Comment:        PERSISTENTLY INCREASED TROPONIN VALUES IN THE RANGE OF 0.04-0.49 ng/mL CAN BE SEEN IN:       -UNSTABLE ANGINA       -CONGESTIVE HEART FAILURE       -MYOCARDITIS       -CHEST TRAUMA       -ARRYHTHMIAS       -  LATE PRESENTING MYOCARDIAL INFARCTION       -COPD   CLINICAL FOLLOW-UP RECOMMENDED.     IMAGING: Dg Chest 2 View  01/13/2015   CLINICAL DATA:  Chest pain, vomiting.  EXAM: CHEST  2 VIEW  COMPARISON:  12/19/2014  FINDINGS: Cardiomegaly is unchanged. Small right pleural effusion is unchanged. Vascular congestion again seen. There is no consolidation or pneumothorax. No acute osseous abnormalities.  IMPRESSION: Unchanged cardiomegaly and chronic right pleural effusion.   Electronically Signed   By: Jeb Levering M.D.   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 Chest pain- there are number of cardiac risk factors and she's been previously evaluated for pulmonary arterial hypertension as well as recurrent chest pain. She does have new troponin elevations however missed dialysis and appears to have some degree of volume overload. BNP is elevated. Troponins may be related to renal dysfunction and/or underlying coronary disease. Initially it was planned for her to undergo left and right heart catheterization in December however she missed that procedure due to having diarrhea. She says that she had a heart catheterization when she was in [place] about 1-2 years ago, but cannot remember if it was left or right heart catheterization. No procedures were performed at that time. Based on her chest pain and elevated troponins, I would recommend heparinization and proceeding with left and right heart catheterization once we are sure that she has no underlying infection. 2. Recurrent ascites/portal hypertension- she has recurrent paracentesis and recently was given Cipro for possible SBP. She had paracentesis and was given albumin on Thursday. She is not complaining of any abdominal pain at this time but has had vomiting, leukocytosis and other symptoms concerning for SBP. Will defer to medicine however this should be addressed prior to catheterization. 3. Pulmonary arterial hypertension- probably related to portal hypertension and/or persistent volume overload. Right heart catheterization was planned it would be prudent to do in conjunction with left heart catheterization. 4. End-stage renal disease on HD- she missed her dialysis on Friday. Given her electrolytes, nausea and other symptoms she will need more urgent dialysis likely this weekend.  Thanks for the consultation. Cardiology will follow along with you.  Time Spent Directly with Patient: 45 minutes  Pixie Casino, MD,  [facility] Attending Cardiologist [facility]  Niki Payment C 01/13/2015, 8:25 AM

## 2015-01-13 NOTE — Progress Notes (Addendum)
ANTICOAGULATION CONSULT NOTE - Initial Consult  Pharmacy Consult:  Heparin Indication: chest pain/ACS  Allergies  Allergen Reactions  . Contrast Media [Iodinated Diagnostic Agents] Anaphylaxis  . Procardia [Nifedipine] Other (See Comments)    Sweating, BP drops dangerously low,  . Vancomycin Anaphylaxis and Hives    Patient Measurements: Height:  (175.3 cm) Weight: 180 lb (81.647 kg) IBW/kg (Calculated) : 66.2 Heparin Dosing Weight: 82 kg  Vital Signs: Temp: 97.5 F (36.4 C) (03/27 0130) Temp Source: Oral (03/27 0130) BP: 145/89 mmHg (03/27 0815) Pulse Rate: 96 (03/27 0815)  Labs:  Recent Labs  01/13/15 0150 01/13/15 0545  HGB 9.6*  --   HCT 30.2*  --   PLT 358  --   CREATININE 14.17*  --   TROPONINI 0.11* 0.15*    Estimated Creatinine Clearance: 5.9 mL/min (by C-G formula based on Cr of 14.17).   Medical History: Past Medical History  Diagnosis Date  . 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 Hypertension   . ESRD on hemodialysis 04/20/2014    Patient started HD in 1998.  She has been dialyzed in [place] at "[facility]" until moving to [place] in July 2015 to live with family.  She has had failed accesses in the L arm.  No attempt to place access was made in the R arm, patient is not sure why.  She has a L thigh AVG which she says has been functional for 7-8 years.  They stopped giving her heparin several years ago due to prolonged access bleeding.    Marland Kitchen Heart murmur   . Anemia of chronic disease   . History of blood transfusion     "several; w/transplant, infections, etc"  . Headache(784.0)     "often; sometimes daily" (04/26/2014)  . Migraines     "qod" (04/26/2014)  . ESRD on hemodialysis 04/20/2014    Patient started HD in 1998.  She has been dialyzed in [place] at "[facility]" until moving to [place] in July 2015 to live with family.  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   She has had failed accesses in the L arm.  No attempt to place access was made in the R arm, patient is not sure why.  She has a L thigh AVG which she says has been functional for  . Ascites   . Pulmonary hypertension   . Ascites       Assessment: 44 year old ESRD patient with chest pain and positive troponin to start IV heparin.  Baseline labs reviewed.   Goal of Therapy:  Heparin level 0.3-0.7 units/ml Monitor platelets by anticoagulation protocol: Yes    Plan:  - D/C heparin SQ - Heparin 3500 units IV bolus x 1, then  - Heparin gtt at 1000 units/hr - Check 8 hr HL - Daily HL / CBC    Thuy D. 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  HIT panel has been sent here and will transition patient to bivalruidin.  Pt is ESRD on HD. While argatroban is the better alternative for ESRD patients, we will continue with bivalirudin at a low dose as pt is expected to undergo cardiac cath shortly.  Baseline aptt pending, hgb 9.6, plts wnl.   Plan -Bivalirudin 0.05 mg/kg/hr -Check aptt 2 hours after  infusion then q4hr until therapeutic x2 -F/u plans for cath    Agapito Games, PharmD, BCPS Clinical Pharmacist Pager: [phone] 01/13/2015 1:59 PM    Addendum -Initial aptt therapeutic -Continue 0.0.5 mg/kg/hr -Need aptt q4h until therapeutic level x2  Baldemar Friday  01/13/2015 7:52 PM

## 2015-01-14 DIAGNOSIS — R079 Chest pain, unspecified: Secondary | ICD-10-CM

## 2015-01-14 DIAGNOSIS — D631 Anemia in chronic kidney disease: Secondary | ICD-10-CM

## 2015-01-14 LAB — BASIC METABOLIC PANEL
ANION GAP: 19 — AB (ref 5–15)
BUN: 114 mg/dL — ABNORMAL HIGH (ref 6–23)
CHLORIDE: 95 mmol/L — AB (ref 96–112)
CO2: 19 mmol/L (ref 19–32)
CREATININE: 15.19 mg/dL — AB (ref 0.50–1.10)
Calcium: 7.4 mg/dL — ABNORMAL LOW (ref 8.4–10.5)
GFR calc Af Amer: 3 mL/min — ABNORMAL LOW (ref >90)
GFR calc non Af Amer: 3 mL/min — ABNORMAL LOW (ref >90)
GLUCOSE: 96 mg/dL (ref 70–99)
Potassium: 6.1 mmol/L (ref 3.5–5.1)
Sodium: 133 mmol/L — ABNORMAL LOW (ref 135–145)

## 2015-01-14 LAB — CBC
HEMATOCRIT: 24.8 % — AB (ref 36.0–46.0)
Hemoglobin: 8 g/dL — ABNORMAL LOW (ref 12.0–15.0)
MCH: 27.8 pg (ref 26.0–34.0)
MCHC: 32.3 g/dL (ref 30.0–36.0)
MCV: 86.1 fL (ref 78.0–100.0)
PLATELETS: 333 10*3/uL (ref 150–400)
RBC: 2.88 MIL/uL — ABNORMAL LOW (ref 3.87–5.11)
RDW: 14.4 % (ref 11.5–15.5)
WBC: 11.3 10*3/uL — ABNORMAL HIGH (ref 4.0–10.5)

## 2015-01-14 LAB — APTT
APTT: 62 s — AB (ref 24–37)
aPTT: 62 seconds — ABNORMAL HIGH (ref 24–37)

## 2015-01-14 LAB — HEPARIN INDUCED THROMBOCYTOPENIA PNL: Heparin Induced Plt Ab: 0.196 OD (ref 0.000–0.400)

## 2015-01-14 LAB — TROPONIN I: Troponin I: 0.23 ng/mL — ABNORMAL HIGH (ref <0.031)

## 2015-01-14 MED ORDER — SODIUM CHLORIDE 0.9 % IV SOLN: 250.0000 mL | INTRAVENOUS | Status: DC | PRN

## 2015-01-14 MED ORDER — HYDROMORPHONE HCL 1 MG/ML IJ SOLN
INTRAMUSCULAR | Status: AC
  Administered 2015-01-14: 0.5 mg via INTRAVENOUS
  Filled 2015-01-14: qty 1

## 2015-01-14 MED ORDER — SODIUM CHLORIDE 0.9 % IJ SOLN
3.0000 mL | Freq: Two times a day (BID) | INTRAMUSCULAR | Status: DC
  Administered 2015-01-14: 3 mL via INTRAVENOUS

## 2015-01-14 MED ORDER — SODIUM CHLORIDE 0.9 % IV SOLN
INTRAVENOUS | Status: DC
Start: 2015-01-15 — End: 2015-01-15

## 2015-01-14 MED ORDER — DARBEPOETIN ALFA 60 MCG/0.3ML IJ SOSY
60.0000 ug | PREFILLED_SYRINGE | INTRAMUSCULAR | Status: DC
  Administered 2015-01-14: 60 ug via INTRAVENOUS

## 2015-01-14 MED ORDER — CLONIDINE HCL 0.2 MG PO TABS
0.2000 mg | ORAL_TABLET | Freq: Two times a day (BID) | ORAL | Status: DC
  Administered 2015-01-14 – 2015-01-15 (×2): 0.2 mg via ORAL
  Filled 2015-01-14 (×4): qty 1

## 2015-01-14 MED ORDER — DARBEPOETIN ALFA 60 MCG/0.3ML IJ SOSY
PREFILLED_SYRINGE | INTRAMUSCULAR | Status: AC
  Filled 2015-01-14: qty 0.3

## 2015-01-14 MED ORDER — SODIUM CHLORIDE 0.9 % IJ SOLN: 3.0000 mL | INTRAMUSCULAR | Status: DC | PRN

## 2015-01-14 MED ORDER — DOXERCALCIFEROL 4 MCG/2ML IV SOLN
INTRAVENOUS | Status: AC
  Administered 2015-01-14: 8 ug via INTRAVENOUS
  Filled 2015-01-14: qty 4

## 2015-01-14 MED ORDER — AMLODIPINE BESYLATE 5 MG PO TABS
5.0000 mg | ORAL_TABLET | Freq: Every day | ORAL | Status: DC
  Administered 2015-01-14 – 2015-01-15 (×2): 5 mg via ORAL
  Filled 2015-01-14 (×2): qty 1

## 2015-01-14 MED ORDER — ASPIRIN 81 MG PO CHEW
81.0000 mg | CHEWABLE_TABLET | ORAL | Status: AC
  Administered 2015-01-15: 81 mg via ORAL
  Filled 2015-01-14: qty 1

## 2015-01-14 NOTE — Progress Notes (Signed)
Chaplain attempted visit as requested by pt.   Pt was sleeping and I didn't want to disturb her.   Will return later.   Gala Romney, Chaplain 01/14/2015

## 2015-01-14 NOTE — Progress Notes (Signed)
Utilization review completed.  

## 2015-01-14 NOTE — Progress Notes (Addendum)
SUBJECTIVE:  Patient is stable today undergoing dialysis at the moment. She's not having any chest pain.   Filed Vitals:   01/14/15 0727 01/14/15 0800 01/14/15 0830 01/14/15 0900  BP: 123/79 109/71 114/78 116/93  Pulse: 94 94 99 107  Temp:      TempSrc:      Resp: 20 12 19 27   Height:      Weight:      SpO2:         Intake/Output Summary (Last 24 hours) at 01/14/15 0925 Last data filed at 01/13/15 1700  Gross per 24 hour  Intake    120 ml  Output      0 ml  Net    120 ml    LABS: Basic Metabolic Panel:  Recent Labs  16/10/96 0150 01/14/15 0730  NA 135 133*  K 5.3* 6.1*  CL 93* 95*  CO2 24 19  GLUCOSE 107* 96  BUN 100* 114*  CREATININE 14.17* 15.19*  CALCIUM 8.0* 7.4*   Liver Function Tests:  Recent Labs  01/13/15 1145  AST 15  ALT 15  ALKPHOS 143*  BILITOT 0.6  PROT 5.5*  ALBUMIN 2.1*   No results for input(s): LIPASE, AMYLASE in the last 72 hours. CBC:  Recent Labs  01/13/15 0150 01/14/15 0730  WBC 15.6* 11.3*  NEUTROABS 13.0*  --   HGB 9.6* 8.0*  HCT 30.2* 24.8*  MCV 87.0 86.1  PLT 358 333   Cardiac Enzymes:  Recent Labs  01/13/15 1145 01/13/15 1841 01/14/15 0040  TROPONINI 0.26* 0.25* 0.23*   BNP: Invalid input(s): POCBNP D-Dimer: No results for input(s): DDIMER in the last 72 hours. Hemoglobin A1C: No results for input(s): HGBA1C in the last 72 hours. Fasting Lipid Panel: No results for input(s): CHOL, HDL, LDLCALC, TRIG, CHOLHDL, LDLDIRECT in the last 72 hours. Thyroid Function Tests: No results for input(s): TSH, T4TOTAL, T3FREE, THYROIDAB in the last 72 hours.  Invalid input(s): FREET3  RADIOLOGY: Ct Abdomen Pelvis Wo Contrast  12/20/2014   CLINICAL DATA:  Right lower abdominal pain, failed renal transplant  EXAM: CT ABDOMEN AND PELVIS WITHOUT CONTRAST  TECHNIQUE: Multidetector CT imaging of the abdomen and pelvis was performed following the standard protocol without IV contrast.  COMPARISON:  Abdominal ultrasound  dated 09/18/2014  FINDINGS: Lower chest:  Small right pleural effusion.  Mild ground-glass opacities in the bilateral lower lobes, possibly interstitial edema or less likely infection.  Hepatobiliary: Liver is grossly unremarkable.  Gallbladder is notable for layering sludge. No intrahepatic or extrahepatic ductal dilatation.  Pancreas: Within normal limits.  Spleen: Suspected prior splenic infarct (series 201/ image 25).  Adrenals/Urinary Tract: Adrenal glands are unremarkable.  Renal atrophy with multiple bilateral renal cysts, including a dominant 4.5 cm lateral right upper pole cyst (series 21/ image 39). No hydronephrosis.  Stomach/Bowel: Stomach is within normal limits.  Visualized bowel is unremarkable.  No evidence of bowel obstruction.  Vascular/Lymphatic: Atherosclerotic calcifications of the abdominal aorta and branch vessels.  No suspicious abdominopelvic lymphadenopathy.  Reproductive: Uterus is unremarkable.  Bilateral ovaries are within normal limits.  Other: Moderate to large volume abdominopelvic ascites.  Body wall edema/anasarca.  Musculoskeletal: Renal osteodystrophy.  IMPRESSION: No evidence of bowel obstruction.  Moderate to large volume of pelvic ascites. Body wall edema. Anasarca.  Small right pleural effusion.  Ground-glass opacities in the bilateral lower lobes, incompletely visualized, possibly interstitial edema or less likely infection.   Electronically Signed   By: Charline Bills M.D.   On:  12/20/2014 08:27   Dg Chest 2 View  01/13/2015   CLINICAL DATA:  Chest pain, vomiting.  EXAM: CHEST  2 VIEW  COMPARISON:  12/19/2014  FINDINGS: Cardiomegaly is unchanged. Small right pleural effusion is unchanged. Vascular congestion again seen. There is no consolidation or pneumothorax. No acute osseous abnormalities.  IMPRESSION: Unchanged cardiomegaly and chronic right pleural effusion.   Electronically Signed   By: Rubye Oaks M.D.   On: 01/13/2015 02:41   [place] Art/ven Flow Abd Pelv  Doppler Limited  01/10/2015   CLINICAL DATA:  Cirrhosis of liver with ascites, unspecified hepatic cirrhosis type, Right pleural effusion  EXAM: DUPLEX ULTRASOUND OF LIVER  TECHNIQUE: Color and duplex Doppler ultrasound was performed to evaluate the hepatic in-flow and out-flow vessels.  COMPARISON:  CT 12/20/2014  FINDINGS: Portal Vein measures 14 mm diameter. No evidence of occlusion or thrombus. Velocities (all hepatopetal):  Main:  54-55 cm/sec  Right:  46 cm/sec  Left:  31 cm/sec  Hepatic Vein Velocities (all hepatofugal):  Right:  113 cm/sec  Middle:  164 cm/sec  Left:  77 cm/sec  Intrahepatic IVC is patent, velocity 112 cm/second.  Hepatic Artery Velocity:  60 cm/sec  Spleen measures 10.8 x 14.9 x 4.8 cm (407 cc). Splenic Vein Velocity: 43 cm/sec. No evidence of occlusion or thrombus.  Varices: None visualized  Ascites: Present.  Right pleural effusion incidentally noted.  IMPRESSION: 1. Unremarkable liver vascular Doppler evaluation. 2. Abdominal ascites. 3. Right pleural effusion   Electronically Signed   By: Corlis Leak M.D.   On: 01/10/2015 15:33   US Paracentesis  01/10/2015   INDICATION: ESRD, recurrent ascites and request for paracentesis.  EXAM: ULTRASOUND-GUIDED PARACENTESIS  COMPARISON:  12/19/14 paracentesis.  MEDICATIONS: None.  COMPLICATIONS: None immediate  TECHNIQUE: Informed written consent was obtained from the patient after a discussion of the risks, benefits and alternatives to treatment. A timeout was performed prior to the initiation of the procedure.  Initial ultrasound scanning demonstrates a large amount of ascites within the right upper abdominal quadrant. The right upper abdomen was prepped and draped in the usual sterile fashion. 1% lidocaine was used for local anesthesia. An ultrasound image was saved for documentation purposed. An 6 Fr Safe-T-Centesis catheter was introduced. The paracentesis was performed. The catheter was removed and a dressing was applied. The patient  tolerated the procedure well without immediate post procedural complication. Procedure was stopped early with remaining fluid seen secondary to patient's complaint of pain and request to stop, post procedural images taken.  FINDINGS: A total of approximately 4.7 liters of bloody fluid was removed. Samples were sent to the laboratory as requested by the clinical team.  IMPRESSION: Successful ultrasound-guided paracentesis yielding 4.7 liters of peritoneal fluid.  Read By:  Pattricia Boss PA-C   Electronically Signed   By: Gilmer Mor D.O.   On: 01/10/2015 14:45   US Paracentesis  12/19/2014   INDICATION: Pulmonary artery hypertension, portal hypertension, ESRD, recurrent ascites. Request is made for therapeutic paracentesis.  EXAM: ULTRASOUND-GUIDED THERAPEUTIC PARACENTESIS  COMPARISON:  Prior paracentesis on 12/05/2014  MEDICATIONS: None.  COMPLICATIONS: None immediate  TECHNIQUE: Informed written consent was obtained from the patient after a discussion of the risks, benefits and alternatives to treatment. A timeout was performed prior to the initiation of the procedure.  Initial ultrasound scanning demonstrates a large amount of ascites within the right mid to lower abdominal quadrant. The right mid to lower abdomen was prepped and draped in the usual sterile fashion. 1% lidocaine was  used for local anesthesia. Under direct ultrasound guidance, a 19 gauge, 10-cm, Yueh catheter was introduced. An ultrasound image was saved for documentation purposed. The paracentesis was performed. The catheter was removed and a dressing was applied. The patient tolerated the procedure well without immediate post procedural complication.  FINDINGS: A total of approximately 6.3 liters of yellow fluid was removed.  IMPRESSION: Successful ultrasound-guided therapeutic paracentesis yielding 6.3 liters of peritoneal fluid.  Read by: Jeananne Rama, PA-C   Electronically Signed   By: Malachy Moan M.D.   On: 12/19/2014 12:11   Dg  Abd Acute W/chest  12/19/2014   CLINICAL DATA:  Abdominal pain  EXAM: ACUTE ABDOMEN SERIES (ABDOMEN 2 VIEW & CHEST 1 VIEW)  COMPARISON:  Chest x-ray 08/31/2014  FINDINGS: There is no convincing bowel obstruction. No evidence of perforation or pneumatosis. Amorphous calcification in the right upper quadrant, up to 4 cm in diameter, is chronic based on chest x-ray from 08/31/2014. The location and morphology is unusual, question hepatic or peritoneal based calcification. Relationship to acute abdominal pain is doubtful given chronicity.  Small right pleural effusion. There is cardiomegaly and mild pulmonary venous congestion. No pneumonia.  IMPRESSION: 1. No acute findings. 2. Cardiomegaly and chronic small right pleural effusion. 3. Chronic indeterminate calcification in the right upper quadrant.   Electronically Signed   By: Marnee Spring M.D.   On: 12/19/2014 21:44   US Abdomen Complete W/elastography  01/10/2015   CLINICAL DATA:  Cirrhosis and ascites.  History of renal transplant.  EXAM: ULTRASOUND ABDOMEN  ULTRASOUND HEPATIC ELASTOGRAPHY  TECHNIQUE: Sonography of the upper abdomen was performed. In addition, ultrasound elastography evaluation of the liver was performed. A region of interest was placed within the right lobe of the liver. Following application of a compressive sonographic pulse, shear waves were detected in the adjacent hepatic tissue and the shear wave velocity was calculated. Multiple assessments were performed at the selected site. Median shear wave velocity is correlated to a Metavir fibrosis score.  COMPARISON:  CT scan 12/20/2014  FINDINGS: ULTRASOUND ABDOMEN  Gallbladder: No gallstones or wall thickening visualized. No sonographic Murphy sign noted.  Common bile duct: Diameter: 4.8 mm  Liver: Irregular liver contour or with heterogeneous echogenicity consistent with cirrhosis. No focal hepatic lesions or intrahepatic biliary dilatation  IVC: Normal caliber  Pancreas: Sonographically  unremarkable  Spleen: Upper limits of normal in size. No focal lesions are identified.  Right Kidney: Length: 8.9 cm. Echogenic with numerous cysts consistent with chronic renal disease. No hydronephrosis.  Left Kidney: Length: 8.0 cm. Echogenic with numerous cysts consistent with chronic renal disease. No hydronephrosis.  Abdominal aorta: Normal caliber.  Other findings: Right pleural effusion and moderate abdominal ascites.  ULTRASOUND HEPATIC ELASTOGRAPHY  Device: Siemens Helix VTQ  Transducer 6C1  Patient position: Left lateral decubitus  Number of measurements:  10  Hepatic Segment:  8  Median velocity:   2.95  m/sec  IQR: 0.48  IQR/Median velocity ratio 0.16  Corresponding Metavir fibrosis score:  F3/F4  Risk of fibrosis: High  Limitations of exam: Ascites  Pertinent findings noted on other imaging exams:  None  Please note that abnormal shear wave velocities may also be identified in clinical settings other than with hepatic fibrosis, such as: acute hepatitis, elevated right heart and central venous pressures including use of beta blockers, veno-occlusive disease (Budd-Chiari), infiltrative processes such as mastocytosis/amyloidosis/infiltrative tumor, extrahepatic cholestasis, in the post-prandial state, and liver transplantation. Correlation with patient history, laboratory data, and clinical condition recommended.  IMPRESSION: Cirrhotic changes involving the liver but no focal hepatic lesion  Median hepatic shear wave velocity is calculated at 2.95 m/sec.  Corresponding Metavir fibrosis score is F3/F4.  Risk of fibrosis is high.  Follow-up:  Advised.   Electronically Signed   By: Rudie Meyer M.D.   On: 01/10/2015 15:49    PHYSICAL EXAM   patient is stable. She is lying flat in bed and comfortable while dialysis is in place. She is oriented to person time and place. Affect is normal. Lungs are clear. Respiratory effort is not labored. Cardiac exam reveals S1 and S2. There is no peripheral  edema.   TELEMETRY: Currently her rhythm is stable.   ASSESSMENT AND PLAN:    Chest pain     The patient is stable today. There has been discussion of cardiac catheterization and is now clear that she is ready for this tomorrow. She is being dialyzed today. I've contacted the cath lab when she scheduled for tomorrow at approximately 1 in the afternoon. This has been scheduled as right and left heart cath    ESRD (end stage renal disease) on dialysis   She is being dialyzed at this time.     PAH (pulmonary arterial hypertension) with portal hypertension     This will be assessed further with right and left heart cath tomorrow.    Ascites   Anemia of renal disease   Hyperkalemia  At this time it appears that echo has not been done. I have written the cath orders for tomorrow.  Willa Rough 01/14/2015 9:25 AM

## 2015-01-14 NOTE — Progress Notes (Signed)
*  PRELIMINARY RESULTS* Echocardiogram 2D Echocardiogram has been performed.  Jeryl Columbia 01/14/2015, 2:32 PM

## 2015-01-14 NOTE — Progress Notes (Signed)
[facility]                                                                              Patient Demographics  Holly Mendoza, is a 44 y.o. female, DOB - 06-09-1971, QBV:[id number]  Admit date - 01/13/2015   Admitting Physician Eddie North, MD  Outpatient Primary MD for the patient is Dorrene German, MD  LOS - 1   Chief Complaint  Patient presents with  . Chest Pain      HPI on 01/13/2015 by Dr. Theda Belfast Dhungel 44 year old African-American female with history of end-stage renal disease on dialysis (M,W,F),history of failed kidney transplant pulmonary hypertension, anemia of chronic kidney disease, recurrent ascites for which she gets frequent large volume paracentesis (at [facility] long radiology) and hypertension who presented to the ED with 4 day history of chest pain. Patient had a clotted left femoral AV graft which was declotted by Dr. Edilia Bo on 3/23 and subsequently underwent dialysis the same day. she missed her dialysis on 3/25. On 3/24 she add routine the liver US ( elastography) to r/o liver fibrosis but due to ascites she underwent large volume paracentesis with 4.7 L bloody ascites fluid removed. Patient reports that since that day she was having off and on substernal chest pain, squeezing in nature, 8/10 in severity lasting for almost 5 minutes and self subsiding. She reports associated nausea and heartburn-like symptom. Since the past 2 days she has been having ongoing nausea with 3-4 episodes of brownish emesis.she also reports associated dry cough. Patient mentions that On 3/23 when she had her last dialysis she had some chest pain and her HR increased to 140s and was given some medicine through IV to control her heart rate. She denies any associated shortness of breath, orthopnea or PND She reports having similar chest pain one year ago and having an inconclusive stress test in [place] ( where she lived prior to moving here). She was also admitted in July 2015 with  atypical chest pain thought to be associated with her pulmonary hypertension and fluid overload. Patient denies headache, dizziness, fever, chills, palpitations, abdominal pain,or diarrhea. Denies change in weight or appetite.  Course in the ED Patient's vitals were stable. Her O2 sat was normal on room air. Blood work done showed wbc of 15.6k, hemoglobin of 9.6, platelets of 358. Chemistry showed sodium of 135, Desyrel 5.3, BUN of 100 and creatinine of 14.1. Appointment was elevated to 0.11 and 0.15. Chest x-ray showed cardiomegaly with chronic right pleural effusion. Patient was given 50 mg IV fentanyl and nitroglycerin ointment after which her pain improved to 4/10. Cardiology Dr. Rennis Golden was consulted who recommended admission to hospitalist service and would consult. Patient admitted to telemetry.  Assessment & Plan   Chest pain/Elevated Troponin -Troponin 0.26 upon admission, currently 0.23 -Patient currently chest pain-free -Cardiology consulted and appreciated, possible plan for catheterization tomorrow 01/15/2015 -Patient has supposedly history of heparin-induced thrombocytopenia, currently on Angiomax (hit panel pending) -Continue aspirin -TSH, lipid panel pending  End stage renal disease on dialysis -Patient dialyzes on Monday, Wednesday, Friday, missed her dialysis on 01/11/2015 -Nephrology consultation appreciated, patient currently in dialysis -Patient had recent thrombectomy with revision of her left thigh AV  graft on 01/09/2015  Anemia of renal disease -Hemoglobin currently at baseline -Occult pending  Liver cirrhosis with recurrent ascites -Patient follows with Dr. Rhea Belton -No clear etiology for her cirrhosis, patient had negative hepatic serologies, normal IgG and ANA -Patient does have history of pulmonary hypertension which may be contributing to her cirrhosis -EGD is planned to rule out varices -Recently had liver elastic graafian Doppler to evaluate portal vein which  showed no occlusion or thrombus; high risk for liver fibrosis -Paracentesis done every 1-2 weeks at [place] long -last paracentesis on 01/10/2015- fluid analysis negative for infection  Essential hypertension -Continue home medications: Amlodipine, clonidine  Leukocytosis  -Possibly reactive, WBC improving  -continue monitor CBC  -Chest x-ray shows no infiltrate -Patient currently afebrile  Pulmonary hypertension -Echocardiogram July 2015 showed a BAP of 45 mmHg -Patient follows up with cardiology, Dr. Gala Romney  GERD -Continue PPI  Code Status: Full  Family Communication: None at bedside  Disposition Plan: Admitted, possible cath 3/29  Time Spent in minutes   30 minutes  Procedures  None  Consults   Cardiology  DVT Prophylaxis  Angiomax  Lab Results  Component Value Date   PLT 333 01/14/2015    Medications  Scheduled Meds: . amLODipine  5 mg Oral Daily  . aspirin  325 mg Oral Daily  . calcium acetate  1,334 mg Oral TID WC  . cloNIDine  0.2 mg Oral BID  . darbepoetin (ARANESP) injection - DIALYSIS  60 mcg Intravenous Q Mon-HD  . doxercalciferol  8 mcg Intravenous Q M,W,F-HD  . [START ON 01/16/2015] ferric gluconate (FERRLECIT/NULECIT) IV  62.5 mg Intravenous Q Wed-HD  . methylPREDNISolone (SOLU-MEDROL) injection  125 mg Intravenous Once  . nitroGLYCERIN  1 inch Topical Once  . ondansetron (ZOFRAN) IV  4 mg Intravenous Once  . pantoprazole (PROTONIX) IV  40 mg Intravenous Q12H  . sodium chloride  3 mL Intravenous Q12H   Continuous Infusions: . bivalirudin (ANGIOMAX) infusion 0.5 mg/mL (Non-ACS indications) 0.05 mg/kg/hr (01/13/15 1458)   PRN Meds:.acetaminophen, hydrocerin, HYDROmorphone (DILAUDID) injection, nitroGLYCERIN, ondansetron **OR** ondansetron (ZOFRAN) IV, oxyCODONE, traMADol  Antibiotics    Anti-infectives    None      Subjective:   Augusto Gamble seen and examined today.  Patient denies chest pain at this time. Denies any shortness of  breath. Patient states she has little anxious with a lot of things are going on with her right now.   Objective:   Filed Vitals:   01/14/15 1000 01/14/15 1030 01/14/15 1054 01/14/15 1201  BP: 118/77 134/74 122/74 137/73  Pulse: 91 95 91 92  Temp:   97.5 F (36.4 C)   TempSrc:   Oral   Resp: Height:      Weight:   79.5 kg (175 lb 4.3 oz)   SpO2:   100% 97%    Wt Readings from Last 3 Encounters:  01/14/15 79.5 kg (175 lb 4.3 oz)  01/09/15 83.8 kg (184 lb 11.9 oz)  12/21/14 73 kg (160 lb 15 oz)     Intake/Output Summary (Last 24 hours) at 01/14/15 1335 Last data filed at 01/14/15 1054  Gross per 24 hour  Intake    120 ml  Output   2776 ml  Net  -2656 ml    Exam  General: Well developed, well nourished  HEENT: NCAT,  mucous membranes moist.   Cardiovascular: S1 S2 auscultated, Regular rate and rhythm. 2/6SEM  Respiratory: Diminished breath sounds  Abdomen: Soft, nontender, nondistended, +  bowel sounds  Extremities: warm dry without cyanosis clubbing. LE edema R>L, L thigh AVF  Neuro: AAOx3, nonfocal   Psych: Anxious   Data Review   Micro Results Recent Results (from the past 240 hour(s))  Body fluid culture     Status: None   Collection Time: 01/10/15 10:51 AM  Result Value Ref Range Status   Specimen Description FLUID ASCITIC  Final   Special Requests NONE  Final   Gram Stain   Final    NO WBC SEEN NO ORGANISMS SEEN Performed at [facility]    Culture   Final    NO GROWTH 3 DAYS Performed at [facility]    Report Status 01/13/2015 FINAL  Final    Radiology Reports Ct Abdomen Pelvis Wo Contrast  12/20/2014   CLINICAL DATA:  Right lower abdominal pain, failed renal transplant  EXAM: CT ABDOMEN AND PELVIS WITHOUT CONTRAST  TECHNIQUE: Multidetector CT imaging of the abdomen and pelvis was performed following the standard protocol without IV contrast.  COMPARISON:  Abdominal ultrasound dated 09/18/2014  FINDINGS: Lower  chest:  Small right pleural effusion.  Mild ground-glass opacities in the bilateral lower lobes, possibly interstitial edema or less likely infection.  Hepatobiliary: Liver is grossly unremarkable.  Gallbladder is notable for layering sludge. No intrahepatic or extrahepatic ductal dilatation.  Pancreas: Within normal limits.  Spleen: Suspected prior splenic infarct (series 201/ image 25).  Adrenals/Urinary Tract: Adrenal glands are unremarkable.  Renal atrophy with multiple bilateral renal cysts, including a dominant 4.5 cm lateral right upper pole cyst (series 21/ image 39). No hydronephrosis.  Stomach/Bowel: Stomach is within normal limits.  Visualized bowel is unremarkable.  No evidence of bowel obstruction.  Vascular/Lymphatic: Atherosclerotic calcifications of the abdominal aorta and branch vessels.  No suspicious abdominopelvic lymphadenopathy.  Reproductive: Uterus is unremarkable.  Bilateral ovaries are within normal limits.  Other: Moderate to large volume abdominopelvic ascites.  Body wall edema/anasarca.  Musculoskeletal: Renal osteodystrophy.  IMPRESSION: No evidence of bowel obstruction.  Moderate to large volume of pelvic ascites. Body wall edema. Anasarca.  Small right pleural effusion.  Ground-glass opacities in the bilateral lower lobes, incompletely visualized, possibly interstitial edema or less likely infection.   Electronically Signed   By: Charline Bills M.D.   On: 12/20/2014 08:27   Dg Chest 2 View  01/13/2015   CLINICAL DATA:  Chest pain, vomiting.  EXAM: CHEST  2 VIEW  COMPARISON:  12/19/2014  FINDINGS: Cardiomegaly is unchanged. Small right pleural effusion is unchanged. Vascular congestion again seen. There is no consolidation or pneumothorax. No acute osseous abnormalities.  IMPRESSION: Unchanged cardiomegaly and chronic right pleural effusion.   Electronically Signed   By: Rubye Oaks M.D.   On: 01/13/2015 02:41   [place] Art/ven Flow Abd Pelv Doppler Limited  01/10/2015    CLINICAL DATA:  Cirrhosis of liver with ascites, unspecified hepatic cirrhosis type, Right pleural effusion  EXAM: DUPLEX ULTRASOUND OF LIVER  TECHNIQUE: Color and duplex Doppler ultrasound was performed to evaluate the hepatic in-flow and out-flow vessels.  COMPARISON:  CT 12/20/2014  FINDINGS: Portal Vein measures 14 mm diameter. No evidence of occlusion or thrombus. Velocities (all hepatopetal):  Main:  54-55 cm/sec  Right:  46 cm/sec  Left:  31 cm/sec  Hepatic Vein Velocities (all hepatofugal):  Right:  113 cm/sec  Middle:  164 cm/sec  Left:  77 cm/sec  Intrahepatic IVC is patent, velocity 112 cm/second.  Hepatic Artery Velocity:  60 cm/sec  Spleen measures 10.8 x 14.9  x 4.8 cm (407 cc). Splenic Vein Velocity: 43 cm/sec. No evidence of occlusion or thrombus.  Varices: None visualized  Ascites: Present.  Right pleural effusion incidentally noted.  IMPRESSION: 1. Unremarkable liver vascular Doppler evaluation. 2. Abdominal ascites. 3. Right pleural effusion   Electronically Signed   By: Corlis Leak M.D.   On: 01/10/2015 15:33   US Paracentesis  01/10/2015   INDICATION: ESRD, recurrent ascites and request for paracentesis.  EXAM: ULTRASOUND-GUIDED PARACENTESIS  COMPARISON:  12/19/14 paracentesis.  MEDICATIONS: None.  COMPLICATIONS: None immediate  TECHNIQUE: Informed written consent was obtained from the patient after a discussion of the risks, benefits and alternatives to treatment. A timeout was performed prior to the initiation of the procedure.  Initial ultrasound scanning demonstrates a large amount of ascites within the right upper abdominal quadrant. The right upper abdomen was prepped and draped in the usual sterile fashion. 1% lidocaine was used for local anesthesia. An ultrasound image was saved for documentation purposed. An 6 Fr Safe-T-Centesis catheter was introduced. The paracentesis was performed. The catheter was removed and a dressing was applied. The patient tolerated the procedure well without  immediate post procedural complication. Procedure was stopped early with remaining fluid seen secondary to patient's complaint of pain and request to stop, post procedural images taken.  FINDINGS: A total of approximately 4.7 liters of bloody fluid was removed. Samples were sent to the laboratory as requested by the clinical team.  IMPRESSION: Successful ultrasound-guided paracentesis yielding 4.7 liters of peritoneal fluid.  Read By:  Pattricia Boss PA-C   Electronically Signed   By: Gilmer Mor D.O.   On: 01/10/2015 14:45   US Paracentesis  12/19/2014   INDICATION: Pulmonary artery hypertension, portal hypertension, ESRD, recurrent ascites. Request is made for therapeutic paracentesis.  EXAM: ULTRASOUND-GUIDED THERAPEUTIC PARACENTESIS  COMPARISON:  Prior paracentesis on 12/05/2014  MEDICATIONS: None.  COMPLICATIONS: None immediate  TECHNIQUE: Informed written consent was obtained from the patient after a discussion of the risks, benefits and alternatives to treatment. A timeout was performed prior to the initiation of the procedure.  Initial ultrasound scanning demonstrates a large amount of ascites within the right mid to lower abdominal quadrant. The right mid to lower abdomen was prepped and draped in the usual sterile fashion. 1% lidocaine was used for local anesthesia. Under direct ultrasound guidance, a 19 gauge, 10-cm, Yueh catheter was introduced. An ultrasound image was saved for documentation purposed. The paracentesis was performed. The catheter was removed and a dressing was applied. The patient tolerated the procedure well without immediate post procedural complication.  FINDINGS: A total of approximately 6.3 liters of yellow fluid was removed.  IMPRESSION: Successful ultrasound-guided therapeutic paracentesis yielding 6.3 liters of peritoneal fluid.  Read by: Jeananne Rama, PA-C   Electronically Signed   By: Malachy Moan M.D.   On: 12/19/2014 12:11   Dg Abd Acute W/chest  12/19/2014    CLINICAL DATA:  Abdominal pain  EXAM: ACUTE ABDOMEN SERIES (ABDOMEN 2 VIEW & CHEST 1 VIEW)  COMPARISON:  Chest x-ray 08/31/2014  FINDINGS: There is no convincing bowel obstruction. No evidence of perforation or pneumatosis. Amorphous calcification in the right upper quadrant, up to 4 cm in diameter, is chronic based on chest x-ray from 08/31/2014. The location and morphology is unusual, question hepatic or peritoneal based calcification. Relationship to acute abdominal pain is doubtful given chronicity.  Small right pleural effusion. There is cardiomegaly and mild pulmonary venous congestion. No pneumonia.  IMPRESSION: 1. No acute findings. 2. Cardiomegaly and  chronic small right pleural effusion. 3. Chronic indeterminate calcification in the right upper quadrant.   Electronically Signed   By: Marnee Spring M.D.   On: 12/19/2014 21:44   US Abdomen Complete W/elastography  01/10/2015   CLINICAL DATA:  Cirrhosis and ascites.  History of renal transplant.  EXAM: ULTRASOUND ABDOMEN  ULTRASOUND HEPATIC ELASTOGRAPHY  TECHNIQUE: Sonography of the upper abdomen was performed. In addition, ultrasound elastography evaluation of the liver was performed. A region of interest was placed within the right lobe of the liver. Following application of a compressive sonographic pulse, shear waves were detected in the adjacent hepatic tissue and the shear wave velocity was calculated. Multiple assessments were performed at the selected site. Median shear wave velocity is correlated to a Metavir fibrosis score.  COMPARISON:  CT scan 12/20/2014  FINDINGS: ULTRASOUND ABDOMEN  Gallbladder: No gallstones or wall thickening visualized. No sonographic Murphy sign noted.  Common bile duct: Diameter: 4.8 mm  Liver: Irregular liver contour or with heterogeneous echogenicity consistent with cirrhosis. No focal hepatic lesions or intrahepatic biliary dilatation  IVC: Normal caliber  Pancreas: Sonographically unremarkable  Spleen: Upper limits  of normal in size. No focal lesions are identified.  Right Kidney: Length: 8.9 cm. Echogenic with numerous cysts consistent with chronic renal disease. No hydronephrosis.  Left Kidney: Length: 8.0 cm. Echogenic with numerous cysts consistent with chronic renal disease. No hydronephrosis.  Abdominal aorta: Normal caliber.  Other findings: Right pleural effusion and moderate abdominal ascites.  ULTRASOUND HEPATIC ELASTOGRAPHY  Device: Siemens Helix VTQ  Transducer 6C1  Patient position: Left lateral decubitus  Number of measurements:  10  Hepatic Segment:  8  Median velocity:   2.95  m/sec  IQR: 0.48  IQR/Median velocity ratio 0.16  Corresponding Metavir fibrosis score:  F3/F4  Risk of fibrosis: High  Limitations of exam: Ascites  Pertinent findings noted on other imaging exams:  None  Please note that abnormal shear wave velocities may also be identified in clinical settings other than with hepatic fibrosis, such as: acute hepatitis, elevated right heart and central venous pressures including use of beta blockers, veno-occlusive disease (Budd-Chiari), infiltrative processes such as mastocytosis/amyloidosis/infiltrative tumor, extrahepatic cholestasis, in the post-prandial state, and liver transplantation. Correlation with patient history, laboratory data, and clinical condition recommended.  IMPRESSION: Cirrhotic changes involving the liver but no focal hepatic lesion  Median hepatic shear wave velocity is calculated at 2.95 m/sec.  Corresponding Metavir fibrosis score is F3/F4.  Risk of fibrosis is high.  Follow-up:  Advised.   Electronically Signed   By: Rudie Meyer M.D.   On: 01/10/2015 15:49    CBC  Recent Labs Lab 01/08/15 1351 01/08/15 2218 01/13/15 0150 01/14/15 0730  WBC  --  12.5* 15.6* 11.3*  HGB 11.2* 9.5* 9.6* 8.0*  HCT 33.0* 29.1* 30.2* 24.8*  PLT  --  291 358 333  MCV  --  86.1 87.0 86.1  MCH  --  28.1 27.7 27.8  MCHC  --  32.6 31.8 32.3  RDW  --  14.2 14.2 14.4  LYMPHSABS  --   --   1.1  --   MONOABS  --   --  1.2*  --   EOSABS  --   --  0.2  --   BASOSABS  --   --  0.0  --     Chemistries   Recent Labs Lab 01/08/15 1351 01/08/15 2218 01/09/15 0805 01/13/15 0150 01/13/15 1145 01/14/15 0730  NA 132* 135 132* 135  --  133*  K 5.6* 5.8* 6.0* 5.3*  --  6.1*  CL  --  98 97 93*  --  95*  CO2  --  --  19  GLUCOSE 127* 159* 94 107*  --  96  BUN  --  111* 115* 100*  --  114*  CREATININE  --  14.70* 14.17* 14.17*  --  15.19*  CALCIUM  --  8.0* 8.0* 8.0*  --  7.4*  AST  --  15  --   --  15  --   ALT  --  13  --   --  15  --   ALKPHOS  --  157*  --   --  143*  --   BILITOT  --  0.5  --   --  0.6  --    ------------------------------------------------------------------------------------------------------------------ estimated creatinine clearance is 5.4 mL/min (by C-G formula based on Cr of 15.19). ------------------------------------------------------------------------------------------------------------------ No results for input(s): HGBA1C in the last 72 hours. ------------------------------------------------------------------------------------------------------------------ No results for input(s): CHOL, HDL, LDLCALC, TRIG, CHOLHDL, LDLDIRECT in the last 72 hours. ------------------------------------------------------------------------------------------------------------------ No results for input(s): TSH, T4TOTAL, T3FREE, THYROIDAB in the last 72 hours.  Invalid input(s): FREET3 ------------------------------------------------------------------------------------------------------------------ No results for input(s): VITAMINB12, FOLATE, FERRITIN, TIBC, IRON, RETICCTPCT in the last 72 hours.  Coagulation profile  Recent Labs Lab 01/08/15 2218  INR 1.20    No results for input(s): DDIMER in the last 72 hours.  Cardiac Enzymes  Recent Labs Lab 01/13/15 1145 01/13/15 1841 01/14/15 0040  TROPONINI 0.26* 0.25* 0.23*    ------------------------------------------------------------------------------------------------------------------ Invalid input(s): POCBNP    Joelee Snoke D.O. on 01/14/2015 at 1:35 PM  Between 7am to 7pm - Pager - [phone]  After 7pm go to [email or web address] - password [email or web address]  And look for the night coverage person covering for me after hours  [facility] Office  [phone]

## 2015-01-14 NOTE — Progress Notes (Signed)
ANTICOAGULATION CONSULT NOTE - Follow Up Consult  Pharmacy Consult for bivalirudin Indication: chest pain/ACS   Labs:  Recent Labs  01/13/15 0150 01/13/15 0545 01/13/15 1145 01/13/15 1449 01/13/15 1841 01/14/15 0040  HGB 9.6*  --   --   --   --   --   HCT 30.2*  --   --   --   --   --   PLT 358  --   --   --   --   --   APTT  --   --   --  30 53* 62*  CREATININE 14.17*  --   --   --   --   --   TROPONINI 0.11* 0.15* 0.26*  --  0.25*  --      Assessment/Plan:  44yo female remains therapeutic on bivalirudin while planning for cardiac study. Will continue gtt at current rate and confirm stable with am labs.   Vernard Gambles, PharmD, BCPS  01/14/2015,1:43 AM

## 2015-01-14 NOTE — Progress Notes (Signed)
ANTICOAGULATION CONSULT NOTE   Pharmacy Consult:  Bivalirudin Indication: chest pain/ACS  Allergies  Allergen Reactions  . Contrast Media [Iodinated Diagnostic Agents] Anaphylaxis  . Procardia [Nifedipine] Other (See Comments)    Sweating, BP drops dangerously low,  . Vancomycin Anaphylaxis and Hives    Patient Measurements: Height: 5\' 9"  (175.3 cm) Weight: 175 lb 4.3 oz (79.5 kg) IBW/kg (Calculated) : 66.2 Heparin Dosing Weight: 82 kg  Vital Signs: Temp: 97.5 F (36.4 C) (03/28 1054) Temp Source: Oral (03/28 1054) BP: 122/74 mmHg (03/28 1054) Pulse Rate: 91 (03/28 1054)  Labs:  Recent Labs  01/13/15 0150  01/13/15 1145  01/13/15 1841 01/14/15 0040 01/14/15 0730 01/14/15 1035  HGB 9.6*  --   --   --   --   --  8.0*  --   HCT 30.2*  --   --   --   --   --  24.8*  --   PLT 358  --   --   --   --   --  333  --   APTT  --   --   --   < > 53* 62*  --  62*  CREATININE 14.17*  --   --   --   --   --  15.19*  --   TROPONINI 0.11*  < > 0.26*  --  0.25* 0.23*  --   --   < > = values in this interval not displayed.  Estimated Creatinine Clearance: 5.4 mL/min (by C-G formula based on Cr of 15.19).   Medical History: Past Medical History  Diagnosis Date  . 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 Hypertension   . ESRD on hemodialysis 04/20/2014    Patient started HD in 1998.  She has been dialyzed in [place] at "[facility]" until moving to [place] in July 2015 to live with family.  She has had failed accesses in the L arm.  No attempt to place access was made in the R arm, patient is not sure why.  She has a L thigh AVG which she says has been functional for 7-8 years.  They stopped giving her heparin several years ago due to prolonged access bleeding.    Marland Kitchen Heart murmur   . Anemia of chronic disease   . History of blood transfusion     "several; w/transplant, infections, etc"  . Headache(784.0)     "often; sometimes daily"  (04/26/2014)  . Migraines     "qod" (04/26/2014)  . ESRD on hemodialysis 04/20/2014    Patient started HD in 1998.  She has been dialyzed in [place] at "[facility]" until moving to [place] in July 2015 to live with family.  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  She has had failed accesses in the L arm.  No attempt to place access was made in the R arm, patient is not sure why.  She has a L thigh AVG which she says has been functional for  . Ascites   . Pulmonary hypertension   . Ascites       Assessment: 44 year old ESRD patient with chest pain and positive troponin. 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  HIT panel has been sent here and patient transitioned to bivalruidin.  Pt is ESRD on HD(dokne today). While argatroban is the better alternative for ESRD patients, we  will continue with bivalirudin at a low dose as pt is expected to undergo cardiac cath tomorrow.    Follow up aptt is stable at 62 on 0.05mg /kg/hr, this is at low end of goal but with possibility of accumulation will continue at current dose. Of note hgb has dropped from 9.6>>8. No bleeding issues noted, will continue to follow. Pltc is normal at 333.  Goal of Therapy:  Aptt goal 50-85s Monitor platelets by anticoagulation protocol: Yes   Plan:  -Continue Bivalirudin 0.05 mg/kg/hr -Check aptt daily -F/u plans for cath  Sheppard Coil PharmD., BCPS Clinical Pharmacist Pager [phone] 01/14/2015 12:03 PM

## 2015-01-14 NOTE — Progress Notes (Signed)
[facility] Progress Note  Assessment/Plan: 1. Chest pain/intermittent sinus tach - ^ BNP, slightly+ troponins, hx of PAH, cards recommends heart cath; ? GI component; Heart rate ^ 120s sinus tach with 1600 removed - pt anxious - "we need to do something" 2. ESRD - MWF - last HD Wed 3/23 at [facility] - missed Friday dialysis - Mild hyperkalemia - on 1 K bath as an outpt due to inadequate HD from nonadherence to treatments i.e skipping treatments AND shortening treatment -Had extensive discussion to help find a way for her to stay on longer - she is actually quite anxious at outpt HD unit and tells me she can't stay on because of pain- which she tells me is in her chest; HD from Sunday was bumped to Monday due to an emergency - K 6.1 - plan 1 hr 1 K then 2 K bath - refusing extra treatment tomorrow - recheck labs tomorrow - readdress need for additional treatment on Tuesday- large issue 3. Hypertension/volume - difficult to determine true EDW due to ascites and nonadherence to dialysis Rx - continue norvasc/clonidine BP ok - set goal for 3 L in 3 hours Monday - since she rarely will stay on for even 2 hours - ; CXR oon admission showed vasc congestion, right pleural effusion - ^^BNP. She is aobut 10kg above her dry wt; I am going to actually decrease her norvasc and clonidine in order to facilitate more volume removal without symptoms 4. Ascites - prob due to chronic vol overload, diast HF and pulm HTN. 5. Anemia - hgb mid9s stable down to 8 ? -  - continue ESA - maintenance Fe - last ESA given 3/14 - resume weekly 6. Metabolic bone disease - Continue hectorol - ongoing issues with noncompliance with binders- tells me she takes 2 phoslo AFTER her meals- I suspect not given Ca/P levels - phoslo to 2 ac when taking POs 7. Nutrition - poor - very low Alb - renal diet 8. Severe chronic ascites/pulmonary hypertension - low alb/noncompliance - recently given cipro for + SBP- earlier this month  - recent para WBC 257 afebrile - serum WBC 15.6 K 83% N 9. Chronic nonadherence to dialysis/ESRD related meds - pt has difficulty seeing her role in contributing to her medical issues 10. Questionable history of HIT - the records we had from [facility] - stated "heparin caused low platelets" but didn't see a HIT panel per se; consequently hasn't been receiving heparin with her hemodialysis treatments; discussed with Dr. Rennis Golden to check a HIT panel and put on Angiomax until results back  Sheffield Slider, PA-C [facility] Beeper [phone] 01/14/2015,9:06 AM  LOS: 1 day   Patient seen and examined, agree with above note with above modifications. Very anxious- overall needs more HD for volume- will decrease BP meds to facilitate volume removal- cont ESA- cards planning for cath to make sure these symptoms are not CAD  Annie Sable, MD 01/14/2015   Subjective:   Worried - too many things happening to her  Objective Filed Vitals:   01/14/15 0712 01/14/15 0727 01/14/15 0800 01/14/15 0830  BP: 138/89 123/79 109/71 114/78  Pulse: 97 94 94 99  Temp: 97.9 F (36.6 C)     TempSrc: Oral     Resp: Height:      Weight: 82.1 kg (181 lb)     SpO2:       Physical Exam General: anxious -  Heart:  RRR up  to 120s - taken out of UF temporarily  Lungs: dim BS no overt rales Abdomen: soft feels slightly more distended than yesterday Extremities: R>L LE edema - more than yesterday Dialysis Access: left thigh AVGG  Dialysis Orders: Center: [facility] MWF - 4 hr (however RARELY runs more than 2 and skips multiple appts) 180, EDW 72.5 1 K 2.5 Ca profile 2 left thigh AVGG NO heparin Aranesp 60 per week - last given 3/14 Hectorol 8 venofer 50/week  Recent Labs: ?Hgb 8.2 3/14 iPTH 1347 and P 14.2!!! - last outpt HD treatment 3/18  Additional Objective Labs: Basic Metabolic Panel:  Recent Labs Lab 01/09/15 0805 01/13/15 0150 01/14/15 0730  NA 132* 135 133*  K 6.0*  5.3* 6.1*  CL 97 93* 95*  CO2 20 24 19   GLUCOSE 94 107* 96  BUN 115* 100* 114*  CREATININE 14.17* 14.17* 15.19*  CALCIUM 8.0* 8.0* 7.4*  PHOS 12.7*  --   --    Liver Function Tests:  Recent Labs Lab 01/08/15 2218 01/09/15 0805 01/13/15 1145  AST 15  --  15  ALT 13  --  15  ALKPHOS 157*  --  143*  BILITOT 0.5  --  0.6  PROT 5.2*  --  5.5*  ALBUMIN 1.6* 1.5* 2.1*   CBC:  Recent Labs Lab 01/08/15 2218 01/13/15 0150 01/14/15 0730  WBC 12.5* 15.6* 11.3*  NEUTROABS  --  13.0*  --   HGB 9.5* 9.6* 8.0*  HCT 29.1* 30.2* 24.8*  MCV 86.1 87.0 86.1  PLT 291 358 333   Blood Culture    Component Value Date/Time   SDES FLUID ASCITIC 01/10/2015 1051   SPECREQUEST NONE 01/10/2015 1051   CULT  01/10/2015 1051    NO GROWTH 3 DAYS Performed at [facility]    REPTSTATUS 01/13/2015 FINAL 01/10/2015 1051    Cardiac Enzymes:  Recent Labs Lab 01/13/15 0150 01/13/15 0545 01/13/15 1145 01/13/15 1841 01/14/15 0040  TROPONINI 0.11* 0.15* 0.26* 0.25* 0.23*   Studies/Results: Dg Chest 2 View  01/13/2015   CLINICAL DATA:  Chest pain, vomiting.  EXAM: CHEST  2 VIEW  COMPARISON:  12/19/2014  FINDINGS: Cardiomegaly is unchanged. Small right pleural effusion is unchanged. Vascular congestion again seen. There is no consolidation or pneumothorax. No acute osseous abnormalities.  IMPRESSION: Unchanged cardiomegaly and chronic right pleural effusion.   Electronically Signed   By: Rubye Oaks M.D.   On: 01/13/2015 02:41   Medications: . bivalirudin (ANGIOMAX) infusion 0.5 mg/mL (Non-ACS indications) 0.05 mg/kg/hr (01/13/15 1458)   . amLODipine  10 mg Oral Daily  . aspirin  325 mg Oral Daily  . calcium acetate  1,334 mg Oral TID WC  . cloNIDine  0.3 mg Oral BID  . darbepoetin (ARANESP) injection - DIALYSIS  60 mcg Intravenous Q Mon-HD  . doxercalciferol  8 mcg Intravenous Q M,W,F-HD  . [START ON 01/16/2015] ferric gluconate (FERRLECIT/NULECIT) IV  62.5 mg Intravenous Q  Wed-HD  . methylPREDNISolone (SOLU-MEDROL) injection  125 mg Intravenous Once  . nitroGLYCERIN  1 inch Topical Once  . ondansetron (ZOFRAN) IV  4 mg Intravenous Once  . pantoprazole (PROTONIX) IV  40 mg Intravenous Q12H  . sodium chloride  3 mL Intravenous Q12H

## 2015-01-14 NOTE — Procedures (Signed)
Patient was seen on dialysis and the procedure was supervised.  BFR 300  Via AVG BP is  109/71.   Patient appears to be tolerating treatment well- encouragement given to try and get her to stay on  Holly Mendoza A 01/14/2015

## 2015-01-15 ENCOUNTER — Encounter (HOSPITAL_COMMUNITY)
Admission: EM | Disposition: A | Discharge status: Home / Self Care | Payer: Self-pay | Source: Home / Self Care | Attending: Internal Medicine

## 2015-01-15 ENCOUNTER — Encounter (HOSPITAL_COMMUNITY): Payer: Self-pay | Admitting: *Deleted

## 2015-01-15 LAB — CBC
HCT: 26.7 % — ABNORMAL LOW (ref 36.0–46.0)
HEMOGLOBIN: 8.4 g/dL — AB (ref 12.0–15.0)
MCH: 27.9 pg (ref 26.0–34.0)
MCHC: 31.5 g/dL (ref 30.0–36.0)
MCV: 88.7 fL (ref 78.0–100.0)
PLATELETS: 318 10*3/uL (ref 150–400)
RBC: 3.01 MIL/uL — ABNORMAL LOW (ref 3.87–5.11)
RDW: 14.8 % (ref 11.5–15.5)
WBC: 10.1 10*3/uL (ref 4.0–10.5)

## 2015-01-15 LAB — BASIC METABOLIC PANEL
ANION GAP: 14 (ref 5–15)
BUN: 70 mg/dL — ABNORMAL HIGH (ref 6–23)
CO2: 23 mmol/L (ref 19–32)
CREATININE: 10.98 mg/dL — AB (ref 0.50–1.10)
Calcium: 7.8 mg/dL — ABNORMAL LOW (ref 8.4–10.5)
Chloride: 100 mmol/L (ref 96–112)
GFR calc Af Amer: 4 mL/min — ABNORMAL LOW (ref >90)
GFR calc non Af Amer: 4 mL/min — ABNORMAL LOW (ref >90)
GLUCOSE: 112 mg/dL — AB (ref 70–99)
POTASSIUM: 5.1 mmol/L (ref 3.5–5.1)
Sodium: 137 mmol/L (ref 135–145)

## 2015-01-15 LAB — HCG, SERUM, QUALITATIVE: Preg, Serum: NEGATIVE

## 2015-01-15 LAB — LIPID PANEL
CHOL/HDL RATIO: 5 ratio
CHOLESTEROL: 156 mg/dL (ref 0–200)
HDL: 31 mg/dL — AB (ref >39)
LDL CALC: 90 mg/dL (ref 0–99)
Triglycerides: 176 mg/dL — ABNORMAL HIGH (ref <150)
VLDL: 35 mg/dL (ref 0–40)

## 2015-01-15 LAB — TSH: TSH: 5.854 u[IU]/mL — ABNORMAL HIGH (ref 0.350–4.500)

## 2015-01-15 SURGERY — LEFT AND RIGHT HEART CATHETERIZATION WITH CORONARY ANGIOGRAM

## 2015-01-15 MED ORDER — ATORVASTATIN CALCIUM 20 MG PO TABS: 20.0000 mg | ORAL_TABLET | Freq: Every day | ORAL | Status: DC

## 2015-01-15 MED ORDER — CLONIDINE HCL 0.2 MG PO TABS: 0.2000 mg | ORAL_TABLET | Freq: Two times a day (BID) | ORAL | Status: DC

## 2015-01-15 MED ORDER — AMLODIPINE BESYLATE 5 MG PO TABS: 5.0000 mg | ORAL_TABLET | Freq: Every day | ORAL | Status: DC

## 2015-01-15 NOTE — Progress Notes (Signed)
Yesterday I reviewed plans for cardiac cath with the patient. I made arrangements in the cath lab. I wrote the cath orders. Today the patient refuses cath. Her cath has been canceled. She can be discharged from the cardiology viewpoint.   Jerral Bonito, MD

## 2015-01-15 NOTE — Progress Notes (Signed)
Notified Dr Myrtis Ser that patient has refused to have cath.  He said to cancel cath

## 2015-01-15 NOTE — Progress Notes (Signed)
[facility] Progress Note  Assessment/Plan: 1. Chest pain/intermittent sinus tach - ^ BNP, slightly+ troponins, hx of PAH, cards recommends heart cath but patient refused 2. ESRD - MWF [place] via AVG -  HD Wed 3/23 at [facility] - missed Friday dialysis - done Monday - on 1 K bath as an outpt due to inadequate HD from nonadherence to treatments i.e skipping treatments AND shortening treatment - refusing extra treatments.  Wants to go home- I urged her that better compliance with HD could help her in many ways 3. Hypertension/volume - difficult to determine true EDW due to ascites and nonadherence to dialysis Rx - continue norvasc/clonidine BP ok - set goal for 3 L in 3 hours Monday - since she rarely will stay on for even 2 hours - ; CXR oon admission showed vasc congestion, right pleural effusion - ^^BNP. She is aobut 10kg above her dry wt; I have decreased her BP meds in an effort to remove more fluid. 4. Ascites - prob due to chronic vol overload, diast HF and pulm HTN. 5. Anemia - hgb mid9s, now down to 8 ? -  - continue ESA - maintenance Fe - last ESA given 3/14 - resume weekly 6. Metabolic bone disease - Continue hectorol - ongoing issues with noncompliance with binders- tells me she takes 2 phoslo AFTER her meals- I suspect not given Ca/P levels - phoslo to 2 ac when taking POs 7. Nutrition - poor - very low Alb - renal diet 8. Severe chronic ascites/pulmonary hypertension - low alb/noncompliance - recently given cipro for + SBP- earlier this month - recent para WBC 257 afebrile - serum WBC 15.6 K 83% N 9. Chronic nonadherence to dialysis/ESRD related meds - pt has difficulty seeing her role in contributing to her medical issues 10. Dispo- since is refusing cath, likely will be discharged today - will write HD orders for here tomorrow if stays  [name] A   01/15/2015,8:16 AM  LOS: 2 days     Subjective:   HD yest- removed 2700- refused cath for today "i need  to make peace before I do that but I will do that as OP"  Objective Filed Vitals:   01/14/15 1512 01/14/15 2006 01/14/15 2300 01/15/15 0458  BP: 129/82 133/83 137/76 121/76  Pulse: 112 117  107  Temp:  98.8 F (37.1 C)  98.2 F (36.8 C)  TempSrc:  Oral  Oral  Resp:  18  18  Height:      Weight:    80.3 kg (177 lb 0.5 oz)  SpO2: 100% 100%  99%   Physical Exam General: anxious -  Heart:  RRR up to 120s - taken out of UF temporarily  Lungs: dim BS no overt rales Abdomen: soft feels slightly more distended than yesterday Extremities: R>L LE edema - more than yesterday Dialysis Access: left thigh AVGG  Dialysis Orders: Center: [facility] MWF - 4 hr (however RARELY runs more than 2 and skips multiple appts) 180, EDW 72.5 1 K 2.5 Ca profile 2 left thigh AVGG NO heparin Aranesp 60 per week - last given 3/14 Hectorol 8 venofer 50/week  Recent Labs: ?Hgb 8.2 3/14 iPTH 1347 and P 14.2!!! - last outpt HD treatment 3/18  Additional Objective Labs: Basic Metabolic Panel:  Recent Labs Lab 01/09/15 0805 01/13/15 0150 01/14/15 0730 01/15/15 0437  NA 132* 135 133* 137  K 6.0* 5.3* 6.1* 5.1  CL 97 93* 95* 100  CO2 GLUCOSE  94 107* 96 112*  BUN 115* 100* 114* 70*  CREATININE 14.17* 14.17* 15.19* 10.98*  CALCIUM 8.0* 8.0* 7.4* 7.8*  PHOS 12.7*  --   --   --    Liver Function Tests:  Recent Labs Lab 01/08/15 2218 01/09/15 0805 01/13/15 1145  AST 15  --  15  ALT 13  --  15  ALKPHOS 157*  --  143*  BILITOT 0.5  --  0.6  PROT 5.2*  --  5.5*  ALBUMIN 1.6* 1.5* 2.1*   CBC:  Recent Labs Lab 01/08/15 2218 01/13/15 0150 01/14/15 0730 01/15/15 0437  WBC 12.5* 15.6* 11.3* 10.1  NEUTROABS  --  13.0*  --   --   HGB 9.5* 9.6* 8.0* 8.4*  HCT 29.1* 30.2* 24.8* 26.7*  MCV 86.1 87.0 86.1 88.7  PLT 291 358 333 318   Blood Culture    Component Value Date/Time   SDES FLUID ASCITIC 01/10/2015 1051   SPECREQUEST NONE 01/10/2015 1051   CULT  01/10/2015 1051    NO GROWTH 3  DAYS Performed at [facility]    REPTSTATUS 01/13/2015 FINAL 01/10/2015 1051    Cardiac Enzymes:  Recent Labs Lab 01/13/15 0150 01/13/15 0545 01/13/15 1145 01/13/15 1841 01/14/15 0040  TROPONINI 0.11* 0.15* 0.26* 0.25* 0.23*   Studies/Results: No results found. Medications: . sodium chloride    . bivalirudin (ANGIOMAX) infusion 0.5 mg/mL (Non-ACS indications) 0.05 mg/kg/hr (01/13/15 1458)   . amLODipine  5 mg Oral Daily  . aspirin  325 mg Oral Daily  . calcium acetate  1,334 mg Oral TID WC  . cloNIDine  0.2 mg Oral BID  . darbepoetin (ARANESP) injection - DIALYSIS  60 mcg Intravenous Q Mon-HD  . doxercalciferol  8 mcg Intravenous Q M,W,F-HD  . [START ON 01/16/2015] ferric gluconate (FERRLECIT/NULECIT) IV  62.5 mg Intravenous Q Wed-HD  . methylPREDNISolone (SOLU-MEDROL) injection  125 mg Intravenous Once  . nitroGLYCERIN  1 inch Topical Once  . ondansetron (ZOFRAN) IV  4 mg Intravenous Once  . pantoprazole (PROTONIX) IV  40 mg Intravenous Q12H  . sodium chloride  3 mL Intravenous Q12H  . sodium chloride  3 mL Intravenous Q12H

## 2015-01-15 NOTE — Discharge Summary (Addendum)
Physician Discharge Summary  Holly Mendoza ZOX:[id number] DOB: 04-24-1971 DOA: 01/13/2015  PCP: Dorrene German, MD  Admit date: 01/13/2015 Discharge date: 01/15/2015  Time spent: 45 minutes  Recommendations for Outpatient Follow-up:  Patient will be discharged home.  She should continue her medications as prescribed as well as her scheduled dialysis. Patient should follow-up with her primary care physician within one week of discharge. Patient was instructed to call her cardiology office for follow-up as well. Patient to continue a renal diet with 1500 mL of fluid restriction per day. Patient may resume activity as she tolerates.  Discharge Diagnoses:  Chest pain/elevated troponin End-stage renal disease on dialysis Hyperlipidemia Subclinical hypothyroidism Anemia of chronic disease Liver cirrhosis with recurrent ascites Essential hypertension Leukocytosis Pulmonary hypertension GERD  Discharge Condition: Stable  Diet recommendation: Renal diet, 1500 mL fluid restriction per day  Filed Weights   01/14/15 0712 01/14/15 1054 01/15/15 0458  Weight: 82.1 kg (181 lb) 79.5 kg (175 lb 4.3 oz) 80.3 kg (177 lb 0.5 oz)    History of present illness:  on 01/13/2015 by Dr. Theda Belfast Dhungel 44 year old African-American female with history of end-stage renal disease on dialysis (M,W,F),history of failed kidney transplant pulmonary hypertension, anemia of chronic kidney disease, recurrent ascites for which she gets frequent large volume paracentesis (at [facility] long radiology) and hypertension who presented to the ED with 4 day history of chest pain. Patient had a clotted left femoral AV graft which was declotted by Dr. Edilia Bo on 3/23 and subsequently underwent dialysis the same day. she missed her dialysis on 3/25. On 3/24 she add routine the liver US ( elastography) to r/o liver fibrosis but due to ascites she underwent large volume paracentesis with 4.7 L bloody ascites fluid removed. Patient  reports that since that day she was having off and on substernal chest pain, squeezing in nature, 8/10 in severity lasting for almost 5 minutes and self subsiding. She reports associated nausea and heartburn-like symptom. Since the past 2 days she has been having ongoing nausea with 3-4 episodes of brownish emesis.she also reports associated dry cough. Patient mentions that On 3/23 when she had her last dialysis she had some chest pain and her HR increased to 140s and was given some medicine through IV to control her heart rate. She denies any associated shortness of breath, orthopnea or PND She reports having similar chest pain one year ago and having an inconclusive stress test in [place] ( where she lived prior to moving here). She was also admitted in July 2015 with atypical chest pain thought to be associated with her pulmonary hypertension and fluid overload. Patient denies headache, dizziness, fever, chills, palpitations, abdominal pain,or diarrhea. Denies change in weight or appetite.  Course in the ED Patient's vitals were stable. Her O2 sat was normal on room air. Blood work done showed wbc of 15.6k, hemoglobin of 9.6, platelets of 358. Chemistry showed sodium of 135, Desyrel 5.3, BUN of 100 and creatinine of 14.1. Appointment was elevated to 0.11 and 0.15. Chest x-ray showed cardiomegaly with chronic right pleural effusion. Patient was given 50 mg IV fentanyl and nitroglycerin ointment after which her pain improved to 4/10. Cardiology Dr. Rennis Golden was consulted who recommended admission to hospitalist service and would consult. Patient admitted to telemetry.  Hospital Course:  Chest pain/Elevated Troponin -Troponin 0.26 upon admission, currently 0.23 -Patient currently chest pain-free -Cardiology consulted and appreciated, her catheterization was planned for today however patient refused this morning. States "I need to make peace with my mom"  Had long conversation regarding risks and  benefits.  Patient assumes risks of not having the procedure and understands. -Patient has supposedly history of heparin-induced thrombocytopenia, currently on Angiomax (hit panel pending) -Continue aspirin -TSH 5.854 -Lipid profile: TC 156, TG 176, HDL 31, LDL 90 -Patient will be discharged home, she is to follow-up with cardiology.  End stage renal disease on dialysis -Patient dialyzes on Monday, Wednesday, Friday, missed her dialysis on 01/11/2015 -had successful dialysis on 01/14/2015 -Nephrology consultation appreciated, patient currently in dialysis -Patient had recent thrombectomy with revision of her left thigh AV graft on 01/09/2015 -Continue scheduled dialysis  Hyperlipidemia  -Lipid panel as above, will start patient on statin  Subclinical hypothyroidism -TSH mildly elevated however given current circumstances, would re-evaluate her thyroid function as an outpatient before starting Synthroid. -To be discussed with her primary care physician  Anemia of renal disease -Hemoglobin currently at baseline  Liver cirrhosis with recurrent ascites -Patient follows with Dr. Rhea Belton -No clear etiology for her cirrhosis, patient had negative hepatic serologies, normal IgG and ANA -Patient does have history of pulmonary hypertension which may be contributing to her cirrhosis -EGD is planned to rule out varices -Recently had liver elastic graafian Doppler to evaluate portal vein which showed no occlusion or thrombus; high risk for liver fibrosis -Paracentesis done every 1-2 weeks at [facility] long -last paracentesis on 01/10/2015- fluid analysis negative for infection  Essential hypertension -Continue home medications: Amlodipine, clonidine  Leukocytosis  -Resolved, Possibly reactive -Chest x-ray shows no infiltrate -Patient currently afebrile  Pulmonary hypertension -Echocardiogram July 2015 showed a BAP of 45 mmHg -Patient follows up with cardiology, Dr.  Gala Romney  GERD -Continue PPI  Procedures: Echocardiogram: EF 65-70%, Vigorous LV function; mild LVH; mild LAE; calcified aortic valve; but visually valve opens well; some of gradient likely related to vigorous LV function; mild MR; RAE/RVE; severe TR; moderately elevated pulmonary pressure.  Consultations: Cardiology  Discharge Exam: Filed Vitals:   01/15/15 0458  BP: 121/76  Pulse: 107  Temp: 98.2 F (36.8 C)  Resp: 18   Exam  General: Well developed, well nourished, NAD  HEENT: NCAT, mucous membranes moist.   Cardiovascular: S1 S2 auscultated, RRR. 2/6SEM  Respiratory: Diminished breath sounds  Abdomen: Soft, nontender, nondistended, + bowel sounds  Extremities: warm dry without cyanosis clubbing. LE edema R>L, L thigh AVF  Neuro: AAOx3, nonfocal  Psych: Anxious  Discharge Instructions      Discharge Instructions    Discharge instructions    Complete by:  As directed   Patient will be discharged home.  She should continue her medications as prescribed as well as her scheduled dialysis. Patient should follow-up with her primary care physician within one week of discharge. Patient was instructed to call her cardiology office for follow-up as well. Patient to continue a renal diet with 1500 mL of fluid restriction per day. Patient may resume activity as she tolerates.            Medication List    STOP taking these medications        ciprofloxacin 500 MG tablet  Commonly known as:  CIPRO      TAKE these medications        acetaminophen 500 MG tablet  Commonly known as:  TYLENOL  Take 500 mg by mouth every 6 (six) hours as needed for moderate pain.     amLODipine 5 MG tablet  Commonly known as:  NORVASC  Take 1 tablet (5 mg total) by mouth daily.  aspirin EC 81 MG tablet  Take 1 tablet (81 mg total) by mouth daily.     atorvastatin 20 MG tablet  Commonly known as:  LIPITOR  Take 1 tablet (20 mg total) by mouth daily.     calcium acetate  667 MG capsule  Commonly known as:  PHOSLO  Take 667 mg by mouth 3 (three) times daily with meals.     cloNIDine 0.2 MG tablet  Commonly known as:  CATAPRES  Take 1 tablet (0.2 mg total) by mouth 2 (two) times daily.     esomeprazole 40 MG capsule  Commonly known as:  NEXIUM  Take 1 capsule (40 mg total) by mouth daily at 12 noon.     eucerin cream  Apply 1 application topically daily as needed for dry skin.     oxyCODONE 5 MG immediate release tablet  Commonly known as:  Oxy IR/ROXICODONE  Take 1-2 tablets (5-10 mg total) by mouth every 4 (four) hours as needed for moderate pain.     traMADol 50 MG tablet  Commonly known as:  ULTRAM  Take 1 tablet (50 mg total) by mouth every 12 (twelve) hours as needed for severe pain.     zolpidem 5 MG tablet  Commonly known as:  AMBIEN  Take 5 mg by mouth at bedtime as needed.       Allergies  Allergen Reactions  . Contrast Media [Iodinated Diagnostic Agents] Anaphylaxis  . Procardia [Nifedipine] Other (See Comments)    Sweating, BP drops dangerously low,  . Vancomycin Anaphylaxis and Hives   Follow-up Information    Follow up with AVBUERE,EDWIN A, MD. Schedule an appointment as soon as possible for a visit in 1 week.   Specialty:  Internal Medicine   Why:  Hospital follow-up   Contact information:   [address] [place] [address] [phone]       Follow up with Arvilla Meres, MD.   Specialty:  Cardiology   Why:  As needed, chest pain   Contact information:   [address] [place] [address] [phone]        The results of significant diagnostics from this hospitalization (including imaging, microbiology, ancillary and laboratory) are listed below for reference.    Significant Diagnostic Studies: Ct Abdomen Pelvis Wo Contrast  12/20/2014   CLINICAL DATA:  Right lower abdominal pain, failed renal transplant  EXAM: CT ABDOMEN AND PELVIS WITHOUT CONTRAST  TECHNIQUE: Multidetector CT  imaging of the abdomen and pelvis was performed following the standard protocol without IV contrast.  COMPARISON:  Abdominal ultrasound dated 09/18/2014  FINDINGS: Lower chest:  Small right pleural effusion.  Mild ground-glass opacities in the bilateral lower lobes, possibly interstitial edema or less likely infection.  Hepatobiliary: Liver is grossly unremarkable.  Gallbladder is notable for layering sludge. No intrahepatic or extrahepatic ductal dilatation.  Pancreas: Within normal limits.  Spleen: Suspected prior splenic infarct (series 201/ image 25).  Adrenals/Urinary Tract: Adrenal glands are unremarkable.  Renal atrophy with multiple bilateral renal cysts, including a dominant 4.5 cm lateral right upper pole cyst (series 21/ image 39). No hydronephrosis.  Stomach/Bowel: Stomach is within normal limits.  Visualized bowel is unremarkable.  No evidence of bowel obstruction.  Vascular/Lymphatic: Atherosclerotic calcifications of the abdominal aorta and branch vessels.  No suspicious abdominopelvic lymphadenopathy.  Reproductive: Uterus is unremarkable.  Bilateral ovaries are within normal limits.  Other: Moderate to large volume abdominopelvic ascites.  Body wall edema/anasarca.  Musculoskeletal: Renal osteodystrophy.  IMPRESSION:  No evidence of bowel obstruction.  Moderate to large volume of pelvic ascites. Body wall edema. Anasarca.  Small right pleural effusion.  Ground-glass opacities in the bilateral lower lobes, incompletely visualized, possibly interstitial edema or less likely infection.   Electronically Signed   By: Charline Bills M.D.   On: 12/20/2014 08:27   Dg Chest 2 View  01/13/2015   CLINICAL DATA:  Chest pain, vomiting.  EXAM: CHEST  2 VIEW  COMPARISON:  12/19/2014  FINDINGS: Cardiomegaly is unchanged. Small right pleural effusion is unchanged. Vascular congestion again seen. There is no consolidation or pneumothorax. No acute osseous abnormalities.  IMPRESSION: Unchanged cardiomegaly and  chronic right pleural effusion.   Electronically Signed   By: Rubye Oaks M.D.   On: 01/13/2015 02:41   [place] Art/ven Flow Abd Pelv Doppler Limited  01/10/2015   CLINICAL DATA:  Cirrhosis of liver with ascites, unspecified hepatic cirrhosis type, Right pleural effusion  EXAM: DUPLEX ULTRASOUND OF LIVER  TECHNIQUE: Color and duplex Doppler ultrasound was performed to evaluate the hepatic in-flow and out-flow vessels.  COMPARISON:  CT 12/20/2014  FINDINGS: Portal Vein measures 14 mm diameter. No evidence of occlusion or thrombus. Velocities (all hepatopetal):  Main:  54-55 cm/sec  Right:  46 cm/sec  Left:  31 cm/sec  Hepatic Vein Velocities (all hepatofugal):  Right:  113 cm/sec  Middle:  164 cm/sec  Left:  77 cm/sec  Intrahepatic IVC is patent, velocity 112 cm/second.  Hepatic Artery Velocity:  60 cm/sec  Spleen measures 10.8 x 14.9 x 4.8 cm (407 cc). Splenic Vein Velocity: 43 cm/sec. No evidence of occlusion or thrombus.  Varices: None visualized  Ascites: Present.  Right pleural effusion incidentally noted.  IMPRESSION: 1. Unremarkable liver vascular Doppler evaluation. 2. Abdominal ascites. 3. Right pleural effusion   Electronically Signed   By: Corlis Leak M.D.   On: 01/10/2015 15:33   US Paracentesis  01/10/2015   INDICATION: ESRD, recurrent ascites and request for paracentesis.  EXAM: ULTRASOUND-GUIDED PARACENTESIS  COMPARISON:  12/19/14 paracentesis.  MEDICATIONS: None.  COMPLICATIONS: None immediate  TECHNIQUE: Informed written consent was obtained from the patient after a discussion of the risks, benefits and alternatives to treatment. A timeout was performed prior to the initiation of the procedure.  Initial ultrasound scanning demonstrates a large amount of ascites within the right upper abdominal quadrant. The right upper abdomen was prepped and draped in the usual sterile fashion. 1% lidocaine was used for local anesthesia. An ultrasound image was saved for documentation purposed. An 6 Fr  Safe-T-Centesis catheter was introduced. The paracentesis was performed. The catheter was removed and a dressing was applied. The patient tolerated the procedure well without immediate post procedural complication. Procedure was stopped early with remaining fluid seen secondary to patient's complaint of pain and request to stop, post procedural images taken.  FINDINGS: A total of approximately 4.7 liters of bloody fluid was removed. Samples were sent to the laboratory as requested by the clinical team.  IMPRESSION: Successful ultrasound-guided paracentesis yielding 4.7 liters of peritoneal fluid.  Read By:  Pattricia Boss PA-C   Electronically Signed   By: Gilmer Mor D.O.   On: 01/10/2015 14:45   US Paracentesis  12/19/2014   INDICATION: Pulmonary artery hypertension, portal hypertension, ESRD, recurrent ascites. Request is made for therapeutic paracentesis.  EXAM: ULTRASOUND-GUIDED THERAPEUTIC PARACENTESIS  COMPARISON:  Prior paracentesis on 12/05/2014  MEDICATIONS: None.  COMPLICATIONS: None immediate  TECHNIQUE: Informed written consent was obtained from the patient after a discussion of the  risks, benefits and alternatives to treatment. A timeout was performed prior to the initiation of the procedure.  Initial ultrasound scanning demonstrates a large amount of ascites within the right mid to lower abdominal quadrant. The right mid to lower abdomen was prepped and draped in the usual sterile fashion. 1% lidocaine was used for local anesthesia. Under direct ultrasound guidance, a 19 gauge, 10-cm, Yueh catheter was introduced. An ultrasound image was saved for documentation purposed. The paracentesis was performed. The catheter was removed and a dressing was applied. The patient tolerated the procedure well without immediate post procedural complication.  FINDINGS: A total of approximately 6.3 liters of yellow fluid was removed.  IMPRESSION: Successful ultrasound-guided therapeutic paracentesis yielding 6.3  liters of peritoneal fluid.  Read by: Jeananne Rama, PA-C   Electronically Signed   By: Malachy Moan M.D.   On: 12/19/2014 12:11   Dg Abd Acute W/chest  12/19/2014   CLINICAL DATA:  Abdominal pain  EXAM: ACUTE ABDOMEN SERIES (ABDOMEN 2 VIEW & CHEST 1 VIEW)  COMPARISON:  Chest x-ray 08/31/2014  FINDINGS: There is no convincing bowel obstruction. No evidence of perforation or pneumatosis. Amorphous calcification in the right upper quadrant, up to 4 cm in diameter, is chronic based on chest x-ray from 08/31/2014. The location and morphology is unusual, question hepatic or peritoneal based calcification. Relationship to acute abdominal pain is doubtful given chronicity.  Small right pleural effusion. There is cardiomegaly and mild pulmonary venous congestion. No pneumonia.  IMPRESSION: 1. No acute findings. 2. Cardiomegaly and chronic small right pleural effusion. 3. Chronic indeterminate calcification in the right upper quadrant.   Electronically Signed   By: Marnee Spring M.D.   On: 12/19/2014 21:44   US Abdomen Complete W/elastography  01/10/2015   CLINICAL DATA:  Cirrhosis and ascites.  History of renal transplant.  EXAM: ULTRASOUND ABDOMEN  ULTRASOUND HEPATIC ELASTOGRAPHY  TECHNIQUE: Sonography of the upper abdomen was performed. In addition, ultrasound elastography evaluation of the liver was performed. A region of interest was placed within the right lobe of the liver. Following application of a compressive sonographic pulse, shear waves were detected in the adjacent hepatic tissue and the shear wave velocity was calculated. Multiple assessments were performed at the selected site. Median shear wave velocity is correlated to a Metavir fibrosis score.  COMPARISON:  CT scan 12/20/2014  FINDINGS: ULTRASOUND ABDOMEN  Gallbladder: No gallstones or wall thickening visualized. No sonographic Murphy sign noted.  Common bile duct: Diameter: 4.8 mm  Liver: Irregular liver contour or with heterogeneous  echogenicity consistent with cirrhosis. No focal hepatic lesions or intrahepatic biliary dilatation  IVC: Normal caliber  Pancreas: Sonographically unremarkable  Spleen: Upper limits of normal in size. No focal lesions are identified.  Right Kidney: Length: 8.9 cm. Echogenic with numerous cysts consistent with chronic renal disease. No hydronephrosis.  Left Kidney: Length: 8.0 cm. Echogenic with numerous cysts consistent with chronic renal disease. No hydronephrosis.  Abdominal aorta: Normal caliber.  Other findings: Right pleural effusion and moderate abdominal ascites.  ULTRASOUND HEPATIC ELASTOGRAPHY  Device: Siemens Helix VTQ  Transducer 6C1  Patient position: Left lateral decubitus  Number of measurements:  10  Hepatic Segment:  8  Median velocity:   2.95  m/sec  IQR: 0.48  IQR/Median velocity ratio 0.16  Corresponding Metavir fibrosis score:  F3/F4  Risk of fibrosis: High  Limitations of exam: Ascites  Pertinent findings noted on other imaging exams:  None  Please note that abnormal shear wave velocities may also be identified  in clinical settings other than with hepatic fibrosis, such as: acute hepatitis, elevated right heart and central venous pressures including use of beta blockers, veno-occlusive disease (Budd-Chiari), infiltrative processes such as mastocytosis/amyloidosis/infiltrative tumor, extrahepatic cholestasis, in the post-prandial state, and liver transplantation. Correlation with patient history, laboratory data, and clinical condition recommended.  IMPRESSION: Cirrhotic changes involving the liver but no focal hepatic lesion  Median hepatic shear wave velocity is calculated at 2.95 m/sec.  Corresponding Metavir fibrosis score is F3/F4.  Risk of fibrosis is high.  Follow-up:  Advised.   Electronically Signed   By: Rudie Meyer M.D.   On: 01/10/2015 15:49    Microbiology: Recent Results (from the past 240 hour(s))  Body fluid culture     Status: None   Collection Time: 01/10/15 10:51 AM   Result Value Ref Range Status   Specimen Description FLUID ASCITIC  Final   Special Requests NONE  Final   Gram Stain   Final    NO WBC SEEN NO ORGANISMS SEEN Performed at [facility]    Culture   Final    NO GROWTH 3 DAYS Performed at [facility]    Report Status 01/13/2015 FINAL  Final     Labs: Basic Metabolic Panel:  Recent Labs Lab 01/08/15 2218 01/09/15 0805 01/13/15 0150 01/14/15 0730 01/15/15 0437  NA 135 132* 135 133* 137  K 5.8* 6.0* 5.3* 6.1* 5.1  CL 98 97 93* 95* 100  CO2 22 20 24 19 23   GLUCOSE 159* 94 107* 96 112*  BUN 111* 115* 100* 114* 70*  CREATININE 14.70* 14.17* 14.17* 15.19* 10.98*  CALCIUM 8.0* 8.0* 8.0* 7.4* 7.8*  PHOS  --  12.7*  --   --   --    Liver Function Tests:  Recent Labs Lab 01/08/15 2218 01/09/15 0805 01/13/15 1145  AST 15  --  15  ALT 13  --  15  ALKPHOS 157*  --  143*  BILITOT 0.5  --  0.6  PROT 5.2*  --  5.5*  ALBUMIN 1.6* 1.5* 2.1*   No results for input(s): LIPASE, AMYLASE in the last 168 hours. No results for input(s): AMMONIA in the last 168 hours. CBC:  Recent Labs Lab 01/08/15 1351 01/08/15 2218 01/13/15 0150 01/14/15 0730 01/15/15 0437  WBC  --  12.5* 15.6* 11.3* 10.1  NEUTROABS  --   --  13.0*  --   --   HGB 11.2* 9.5* 9.6* 8.0* 8.4*  HCT 33.0* 29.1* 30.2* 24.8* 26.7*  MCV  --  86.1 87.0 86.1 88.7  PLT  --  291 358 333 318   Cardiac Enzymes:  Recent Labs Lab 01/13/15 0150 01/13/15 0545 01/13/15 1145 01/13/15 1841 01/14/15 0040  TROPONINI 0.11* 0.15* 0.26* 0.25* 0.23*   BNP: BNP (last 3 results)  Recent Labs  01/13/15 0151  BNP 2116.2*    ProBNP (last 3 results)  Recent Labs  08/31/14 1806  PROBNP 46663.0*    CBG: No results for input(s): GLUCAP in the last 168 hours.     SignedEdsel Petrin  [facility] 01/15/2015, 10:05 AM

## 2015-01-15 NOTE — Discharge Instructions (Signed)

## 2015-01-15 NOTE — Progress Notes (Signed)
01/15/2015 2:36 PM Discharge AVS meds taken today and those due this evening reviewed.  Follow-up appointments and when to call md reviewed.  D/C IV and TELE.  Questions and concerns addressed.   D/C home per orders. Kathryne Hitch

## 2015-01-15 NOTE — Care Management Note (Signed)
    Page 1 of 1   01/15/2015     11:34:30 AM CARE MANAGEMENT NOTE 01/15/2015  Patient:  Holly Mendoza, Holly Mendoza   Account Number:  [id number]  Date Initiated:  01/15/2015  Documentation initiated by:  Donn Pierini  Subjective/Objective Assessment:   Pt admitted with elevated troponins hx of ESRD     Action/Plan:   PTA pt lived at home   Anticipated DC Date:  01/15/2015   Anticipated DC Plan:  HOME/SELF CARE         Choice offered to / List presented to:             Status of service:  Completed, signed off Medicare Important Message given?  NA - LOS <3 / Initial given by admissions (If response is "NO", the following Medicare IM given date fields will be blank) Date Medicare IM given:   Medicare IM given by:   Date Additional Medicare IM given:   Additional Medicare IM given by:    Discharge Disposition:  HOME/SELF CARE  Per UR Regulation:  Reviewed for med. necessity/level of care/duration of stay  If discussed at Long Length of Stay Meetings, dates discussed:    Comments:

## 2015-01-16 ENCOUNTER — Ambulatory Visit (INDEPENDENT_AMBULATORY_CARE_PROVIDER_SITE_OTHER): Payer: Medicare Other | Admitting: Gastroenterology

## 2015-01-16 ENCOUNTER — Encounter: Payer: Self-pay | Admitting: *Deleted

## 2015-01-16 VITALS — BP 124/66 | HR 104 | Ht 68.0 in | Wt 178.2 lb

## 2015-01-16 DIAGNOSIS — K7460 Unspecified cirrhosis of liver: Secondary | ICD-10-CM

## 2015-01-16 DIAGNOSIS — R188 Other ascites: Secondary | ICD-10-CM | POA: Diagnosis not present

## 2015-01-16 NOTE — Patient Instructions (Signed)

## 2015-01-16 NOTE — Progress Notes (Addendum)
     01/16/2015 Holly Mendoza [id number] 1971-10-17   History of Present Illness:  This is a 44 year old female who is recently known to Dr. Rhea Belton for evaluation regarding ascites.  See his note from 12/11/2014 for details.  She is here today for follow-up after having paracentesis on 3/24.  She was scheduled for ultrasound with elastography, which showed cirrhosis, but needed paracentesis first because there was too much fluid to adequately perform the ultrasound study.  Had 4.7 liters removed and ascites fluid was bloody, but negative for SBP and cytology ok.  She is already scheduled for EGD for esophageal varices screening.  She says that she feels terrible, but no new complaints from GI standpoint.  Was hospitalized 3/27 through yesterday, 3/29, for chest pain.  Troponins were elevated and she was supposed to have cardiac cath, but refused; she has instruction to follow-up with cardiology.   Current Medications, Allergies, Past Medical History, Past Surgical History, Family History and Social History were reviewed in [employer] record.   Physical Exam: BP 124/66 mmHg  Pulse 104  Ht 5\' 8"  (1.727 m)  Wt 178 lb 4 oz (80.854 kg)  BMI 27.11 kg/m2 General: Well developed but chronically ill-appearing female in no acute distress Head: Normocephalic and atraumatic Eyes:  Sclerae anicteric, conjunctiva pink  Ears: Normal auditory acuity Lungs: Clear throughout to auscultation Heart: Regular rate and rhythm Abdomen: Soft, moderately distended with ascites fluid.  BS present.  Non-tender.  Musculoskeletal: Symmetrical with no gross deformities  Extremities: No edema  Neurological: Alert oriented x 4, grossly non-focal Psychological:  Alert and cooperative. Normal mood and affect  Assessment and Recommendations: -Cirrhosis/ascites:  Ultrasound with elastography does show cirrhosis.  Idiopathic cirrhosis at this point.  All evaluation as been unremarkable including  recent ANA and IgG.  She will continue to get paracentesis prn.  She is already scheduled for EGD to exclude esophageal varices.  Further discussion regarding TIPS per Dr. Rhea Belton after EGD. -ESRD:  On HD M/W/F. -Chest pain and elevated troponins:  Needs cardiology follow-up as recommended yesterday at her hospital discharge.    Addendum: Reviewed and agree with ongoing management. Beverley Fiedler, MD

## 2015-01-17 ENCOUNTER — Telehealth: Payer: Self-pay | Admitting: *Deleted

## 2015-01-17 NOTE — Telephone Encounter (Signed)
Left message for patient to call back  

## 2015-01-17 NOTE — Telephone Encounter (Signed)
-----   Message from Leta Baptist, PA-C sent at 01/16/2015  5:24 PM EDT ----- Renato Gails,  I figured I would send this to you since you were working with me when I saw her and she is a [facility] patient.  I did not realize until after she left that she had refused a cardiac cath that they recommended for her to have during her hospitalization for chest pain.  She was told to follow-up with cardiology and she just needs to make sure that she does that and has any recommended testing done by them because they will want that stuff checked out before her EGD with Dr. Rhea Belton in May.  Will you please inform patient of this?  Thank you,  Jess

## 2015-01-18 NOTE — Telephone Encounter (Signed)
Pt said she is returning Dottie's call °

## 2015-01-21 NOTE — Telephone Encounter (Signed)
I have left message for patient to call back. 

## 2015-01-21 NOTE — Telephone Encounter (Signed)
I have spoken to Holly Mendoza to advise her of Otho Darner recommendation to follow up with cardiology regarding chest pain prior to endoscopy scheduled for 02/2015. Patient verbalizes understanding.

## 2015-01-30 ENCOUNTER — Ambulatory Visit (HOSPITAL_COMMUNITY)
Admission: RE | Admit: 2015-01-30 | Discharge: 2015-01-30 | Disposition: A | Discharge status: Home / Self Care | Payer: Medicare Other | Source: Ambulatory Visit | Attending: Internal Medicine | Admitting: Internal Medicine

## 2015-01-30 DIAGNOSIS — N186 End stage renal disease: Secondary | ICD-10-CM | POA: Diagnosis not present

## 2015-01-30 DIAGNOSIS — R188 Other ascites: Secondary | ICD-10-CM | POA: Diagnosis not present

## 2015-01-30 NOTE — Procedures (Signed)
Successful US guided paracentesis from RLQ.  Yielded 7.5 liters of blood tinged fluid.  No immediate complications.  Pt tolerated well.   Specimen was not sent for labs.  Pattricia Boss D PA-C 01/30/2015 11:50 AM

## 2015-02-07 NOTE — Telephone Encounter (Signed)
Left message for patient to call back. She still has not followed up with cardiology. We probably need to cancel her appointment for endoscopy on 02/19/15 until she is seen by cardiology as previously suggested.

## 2015-02-11 NOTE — Telephone Encounter (Signed)
I have spoken to Dr Rhea Belton and he agrees that patient should be cancelled off of the 02/19/15 schedule for endoscopy until she has cardiac clearance (which she still has not scheduled an appointment for). I have spoken to patient to advise her of this and she tells me "yeah, somebody from your office already told me I needed a cardiology appointment." I explained that it was me who previously told her about 1 month ago but that we noticed the cardiology appointment still hadnt been scheduled. She tells me that she will schedule cardiology appointment at which time we can reschedule an endoscopy given her cardiologist gives clearance.

## 2015-02-13 ENCOUNTER — Ambulatory Visit (HOSPITAL_COMMUNITY)
Admission: RE | Admit: 2015-02-13 | Discharge: 2015-02-13 | Disposition: A | Discharge status: Home / Self Care | Payer: Medicare Other | Source: Ambulatory Visit | Attending: Internal Medicine | Admitting: Internal Medicine

## 2015-02-13 DIAGNOSIS — R188 Other ascites: Secondary | ICD-10-CM

## 2015-02-15 ENCOUNTER — Other Ambulatory Visit (HOSPITAL_COMMUNITY): Payer: Self-pay | Admitting: Anesthesiology

## 2015-02-15 ENCOUNTER — Ambulatory Visit (HOSPITAL_COMMUNITY): Payer: Medicare Other

## 2015-02-15 ENCOUNTER — Ambulatory Visit (HOSPITAL_COMMUNITY)
Admission: RE | Admit: 2015-02-15 | Discharge: 2015-02-15 | Disposition: A | Discharge status: Home / Self Care | Payer: Medicare Other | Source: Ambulatory Visit | Attending: Internal Medicine | Admitting: Internal Medicine

## 2015-02-15 ENCOUNTER — Other Ambulatory Visit (HOSPITAL_COMMUNITY): Payer: Self-pay | Admitting: Internal Medicine

## 2015-02-15 DIAGNOSIS — R188 Other ascites: Secondary | ICD-10-CM

## 2015-02-15 NOTE — Procedures (Signed)
US guided therapeutic paracentesis performed yielding 6.9 L of amber fluid. No immediate compilations.

## 2015-02-19 ENCOUNTER — Encounter: Payer: Medicare Other | Admitting: Internal Medicine

## 2015-02-19 ENCOUNTER — Ambulatory Visit (HOSPITAL_COMMUNITY): Admission: RE | Admit: 2015-02-19 | Payer: Medicare Other | Source: Ambulatory Visit

## 2015-03-01 ENCOUNTER — Ambulatory Visit (HOSPITAL_COMMUNITY): Payer: Medicare Other

## 2015-03-05 ENCOUNTER — Ambulatory Visit (HOSPITAL_COMMUNITY)
Admission: RE | Admit: 2015-03-05 | Discharge: 2015-03-05 | Disposition: A | Discharge status: Home / Self Care | Payer: Medicare Other | Source: Ambulatory Visit | Attending: Internal Medicine | Admitting: Internal Medicine

## 2015-03-05 DIAGNOSIS — R188 Other ascites: Secondary | ICD-10-CM | POA: Diagnosis not present

## 2015-03-05 NOTE — Procedures (Signed)
Successful US guided paracentesis from RLQ Yielded 6.8 Liters of Clear yellow fluid.  No immediate complications.  Pt tolerated well.   Specimen was not sent for labs.  Corrin Parker R PA-C 03/05/2015 11:16 AM

## 2015-03-08 NOTE — Discharge Summary (Signed)
Patient ID: Holly Mendoza MRN: [id number] DOB/AGE: [birth year] 44 y.o.  Admit date: 01/08/2015 Discharge date: 01/09/15  Admission Diagnosis: End Stage Renal Disease N18 6 Clotted Left Thigh Arteriovenous Graft T82 858A  Discharge Diagnoses:  End Stage Renal Disease N18 6 Clotted Left Thigh Arteriovenous Graft T82 858A  Secondary Diagnoses: Past Medical History  Diagnosis Date  . 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 Hypertension   . ESRD on hemodialysis 04/20/2014    Patient started HD in 1998.  She has been dialyzed in [place] at "[facility]" until moving to [place] in July 2015 to live with family.  She has had failed accesses in the L arm.  No attempt to place access was made in the R arm, patient is not sure why.  She has a L thigh AVG which she says has been functional for 7-8 years.  They stopped giving her heparin several years ago due to prolonged access bleeding.    Marland Kitchen Heart murmur   . Anemia of chronic disease   . History of blood transfusion     "several; w/transplant, infections, etc"  . Headache(784.0)     "often; sometimes daily" (04/26/2014)  . Migraines     "qod" (04/26/2014)  . ESRD on hemodialysis 04/20/2014    Patient started HD in 1998.  She has been dialyzed in [place] at "[facility]" until moving to [place] in July 2015 to live with family.  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  She has had failed accesses in the L arm.  No attempt to place access was made in the R arm, patient is not sure why.  She has a L thigh AVG which she says has been functional for  . Ascites   . Pulmonary hypertension   . Ascites   . Cirrhosis     Procedures: 01/08/2015 Surgeon(s): Chuck Hint, MD Procedure(s): THROMBECTOMY AND REVISION OF ARTERIOVENTOUS (AV) THIGH GORETEX  GRAFT Left  Discharged Condition: good  HPI: This is a 44 year old woman who had a left thigh AV graft placed 8 or 10  years ago. She presented with a clotted graft which had occurred 3 days ago. For the remainder of the history please refer to my note dated 01/08/2015.  Hospital Course: The patient was admitted for thrombectomy of her left thigh AV graft. She underwent thrombectomy of her left thigh AV graft on 01/08/2015. I replaced the venous half (medial side) of the graft with a 7 mm PTFE graft. I also revise the arterial end of the graft with an interposition 7 mm PTFE graft.the patient was kept overnight for observation with plans to discharge after dialysis.  Consults:  Treatment Team:  [name], MD Delano Metz, MD  Significant Diagnostic Studies:  No results found.  CBC    Component Value Date/Time   WBC 10.1 01/15/2015 0437   RBC 3.01* 01/15/2015 0437   HGB 8.4* 01/15/2015 0437   HCT 26.7* 01/15/2015 0437   PLT 318 01/15/2015 0437   MCV 88.7 01/15/2015 0437   MCH 27.9 01/15/2015 0437   MCHC 31.5 01/15/2015 0437   RDW 14.8 01/15/2015 0437   LYMPHSABS 1.1 01/13/2015 0150   MONOABS 1.2* 01/13/2015 0150   EOSABS 0.2 01/13/2015 0150   BASOSABS 0.0 01/13/2015 0150    BMET    Component Value Date/Time   NA 137 01/15/2015 0437   K 5.1 01/15/2015 0437  CL 100 01/15/2015 0437   CO2 23 01/15/2015 0437   GLUCOSE 112* 01/15/2015 0437   BUN 70* 01/15/2015 0437   CREATININE 10.98* 01/15/2015 0437   CALCIUM 7.8* 01/15/2015 0437   GFRNONAA 4* 01/15/2015 0437   GFRAA 4* 01/15/2015 0437    COAG Lab Results  Component Value Date   INR 1.20 01/08/2015   INR 1.1* 01/03/2015   No results found for: PTT  Disposition: 01-Home or Self Care  Discharge Instructions    Call MD for:  redness, tenderness, or signs of infection (pain, swelling, bleeding, redness, odor or green/yellow discharge around incision site)    Complete by:  As directed      Call MD for:  severe or increased pain, loss or decreased feeling  in affected limb(s)    Complete by:  As directed      Call MD for:   temperature >100.5    Complete by:  As directed      Discharge instructions    Complete by:  As directed   You may shower in 24 hours     Driving Restrictions    Complete by:  As directed   No driving for 24 hours     Increase activity slowly    Complete by:  As directed   Walk with assistance use walker or cane as needed     Lifting restrictions    Complete by:  As directed   No lifting for 6 weeks     Resume previous diet    Complete by:  As directed             Medication List    STOP taking these medications        amLODipine 10 MG tablet  Commonly known as:  NORVASC     ciprofloxacin 500 MG tablet  Commonly known as:  CIPRO     cloNIDine 0.3 MG tablet  Commonly known as:  CATAPRES      TAKE these medications        acetaminophen 500 MG tablet  Commonly known as:  TYLENOL  Take 500 mg by mouth every 6 (six) hours as needed for moderate pain.     aspirin EC 81 MG tablet  Take 1 tablet (81 mg total) by mouth daily.     calcium acetate 667 MG capsule  Commonly known as:  PHOSLO  Take 667 mg by mouth 3 (three) times daily with meals.     esomeprazole 40 MG capsule  Commonly known as:  NEXIUM  Take 1 capsule (40 mg total) by mouth daily at 12 noon.     eucerin cream  Apply 1 application topically daily as needed for dry skin.     oxyCODONE 5 MG immediate release tablet  Commonly known as:  Oxy IR/ROXICODONE  Take 1-2 tablets (5-10 mg total) by mouth every 4 (four) hours as needed for moderate pain.     traMADol 50 MG tablet  Commonly known as:  ULTRAM  Take 1 tablet (50 mg total) by mouth every 12 (twelve) hours as needed for severe pain.           Follow-up Information    Follow up with Waverly Ferrari, MD In 4 weeks.   Specialties:  Vascular Surgery, Cardiology   Why:  sent message to office   Contact information:   [address] [place] [address] [phone]       Signed: Waverly Ferrari 03/08/2015, 2:30 PM

## 2015-03-13 ENCOUNTER — Telehealth (HOSPITAL_COMMUNITY): Payer: Self-pay | Admitting: Surgery

## 2015-03-13 ENCOUNTER — Ambulatory Visit (HOSPITAL_COMMUNITY)
Admission: RE | Admit: 2015-03-13 | Discharge: 2015-03-13 | Disposition: A | Discharge status: Home / Self Care | Payer: Medicare Other | Source: Ambulatory Visit | Attending: Internal Medicine | Admitting: Internal Medicine

## 2015-03-13 DIAGNOSIS — R188 Other ascites: Secondary | ICD-10-CM | POA: Insufficient documentation

## 2015-03-13 MED ORDER — LIDOCAINE HCL (PF) 1 % IJ SOLN
INTRAMUSCULAR | Status: AC
  Filled 2015-03-13: qty 10

## 2015-03-13 NOTE — Telephone Encounter (Signed)
Received call from [place] regarding paracentesis for today.  Holly Mendoza is there early because she will be out of town on her regular day.  I spoke with Dr. Gala Romney and he says that it is fine if she has paracentesis today.

## 2015-03-21 ENCOUNTER — Other Ambulatory Visit: Payer: Self-pay | Admitting: Internal Medicine

## 2015-03-22 ENCOUNTER — Encounter (HOSPITAL_COMMUNITY): Payer: Self-pay | Admitting: Cardiology

## 2015-03-22 ENCOUNTER — Non-Acute Institutional Stay (HOSPITAL_COMMUNITY)
Admission: EM | Admit: 2015-03-22 | Discharge: 2015-03-22 | Disposition: A | Discharge status: Home / Self Care | Payer: Medicare Other | Attending: Emergency Medicine | Admitting: Emergency Medicine

## 2015-03-22 ENCOUNTER — Emergency Department (HOSPITAL_COMMUNITY): Payer: Medicare Other

## 2015-03-22 DIAGNOSIS — J189 Pneumonia, unspecified organism: Secondary | ICD-10-CM

## 2015-03-22 DIAGNOSIS — R112 Nausea with vomiting, unspecified: Secondary | ICD-10-CM | POA: Diagnosis not present

## 2015-03-22 DIAGNOSIS — E875 Hyperkalemia: Secondary | ICD-10-CM | POA: Diagnosis present

## 2015-03-22 DIAGNOSIS — I517 Cardiomegaly: Secondary | ICD-10-CM | POA: Diagnosis not present

## 2015-03-22 DIAGNOSIS — R0602 Shortness of breath: Secondary | ICD-10-CM | POA: Diagnosis not present

## 2015-03-22 DIAGNOSIS — J90 Pleural effusion, not elsewhere classified: Secondary | ICD-10-CM | POA: Insufficient documentation

## 2015-03-22 DIAGNOSIS — R079 Chest pain, unspecified: Secondary | ICD-10-CM | POA: Insufficient documentation

## 2015-03-22 LAB — RENAL FUNCTION PANEL
Albumin: 1.7 g/dL — ABNORMAL LOW (ref 3.5–5.0)
Anion gap: 21 — ABNORMAL HIGH (ref 5–15)
BUN: 151 mg/dL — AB (ref 6–20)
CO2: 17 mmol/L — AB (ref 22–32)
Calcium: 7.9 mg/dL — ABNORMAL LOW (ref 8.9–10.3)
Chloride: 96 mmol/L — ABNORMAL LOW (ref 101–111)
Creatinine, Ser: 17.08 mg/dL — ABNORMAL HIGH (ref 0.44–1.00)
GFR calc Af Amer: 3 mL/min — ABNORMAL LOW (ref >60)
GFR, EST NON AFRICAN AMERICAN: 2 mL/min — AB (ref >60)
GLUCOSE: 103 mg/dL — AB (ref 65–99)
Phosphorus: 13.2 mg/dL — ABNORMAL HIGH (ref 2.5–4.6)
Potassium: 6.1 mmol/L (ref 3.5–5.1)
SODIUM: 134 mmol/L — AB (ref 135–145)

## 2015-03-22 LAB — BASIC METABOLIC PANEL
Anion gap: 24 — ABNORMAL HIGH (ref 5–15)
BUN: 151 mg/dL — AB (ref 6–20)
CALCIUM: 7.9 mg/dL — AB (ref 8.9–10.3)
CHLORIDE: 94 mmol/L — AB (ref 101–111)
CO2: 17 mmol/L — ABNORMAL LOW (ref 22–32)
CREATININE: 16.85 mg/dL — AB (ref 0.44–1.00)
GFR calc Af Amer: 3 mL/min — ABNORMAL LOW (ref >60)
GFR calc non Af Amer: 2 mL/min — ABNORMAL LOW (ref >60)
GLUCOSE: 106 mg/dL — AB (ref 65–99)
POTASSIUM: 6.5 mmol/L — AB (ref 3.5–5.1)
SODIUM: 135 mmol/L (ref 135–145)

## 2015-03-22 LAB — CBC
HCT: 33 % — ABNORMAL LOW (ref 36.0–46.0)
HEMATOCRIT: 37.5 % (ref 36.0–46.0)
Hemoglobin: 12.3 g/dL (ref 12.0–15.0)
Hemoglobin: 11 g/dL — ABNORMAL LOW (ref 12.0–15.0)
MCH: 28.1 pg (ref 26.0–34.0)
MCH: 28.1 pg (ref 26.0–34.0)
MCHC: 32.8 g/dL (ref 30.0–36.0)
MCHC: 33.3 g/dL (ref 30.0–36.0)
MCV: 85.8 fL (ref 78.0–100.0)
MCV: 84.4 fL (ref 78.0–100.0)
PLATELETS: 304 10*3/uL (ref 150–400)
Platelets: 286 10*3/uL (ref 150–400)
RBC: 4.37 MIL/uL (ref 3.87–5.11)
RBC: 3.91 MIL/uL (ref 3.87–5.11)
RDW: 14.7 % (ref 11.5–15.5)
RDW: 14.8 % (ref 11.5–15.5)
WBC: 14.1 10*3/uL — ABNORMAL HIGH (ref 4.0–10.5)
WBC: 13.6 10*3/uL — ABNORMAL HIGH (ref 4.0–10.5)

## 2015-03-22 LAB — BRAIN NATRIURETIC PEPTIDE: B Natriuretic Peptide: 1475.4 pg/mL — ABNORMAL HIGH (ref 0.0–100.0)

## 2015-03-22 LAB — I-STAT TROPONIN, ED: Troponin i, poc: 0.07 ng/mL (ref 0.00–0.08)

## 2015-03-22 MED ORDER — SODIUM CHLORIDE 0.9 % IV SOLN: 100.0000 mL | INTRAVENOUS | Status: DC | PRN

## 2015-03-22 MED ORDER — HYDROMORPHONE HCL 1 MG/ML IJ SOLN
INTRAMUSCULAR | Status: AC
  Filled 2015-03-22: qty 2

## 2015-03-22 MED ORDER — HYDROMORPHONE HCL 1 MG/ML IJ SOLN
2.0000 mg | Freq: Once | INTRAMUSCULAR | Status: AC
  Administered 2015-03-22: 2 mg via INTRAVENOUS

## 2015-03-22 MED ORDER — LIDOCAINE HCL (PF) 1 % IJ SOLN: 5.0000 mL | INTRAMUSCULAR | Status: DC | PRN

## 2015-03-22 MED ORDER — SODIUM POLYSTYRENE SULFONATE 15 GM/60ML PO SUSP
15.0000 g | Freq: Once | ORAL | Status: AC
  Administered 2015-03-22: 15 g via ORAL
  Filled 2015-03-22: qty 60

## 2015-03-22 MED ORDER — LEVOFLOXACIN 750 MG PO TABS
750.0000 mg | ORAL_TABLET | Freq: Once | ORAL | Status: DC
  Filled 2015-03-22: qty 1

## 2015-03-22 MED ORDER — LEVOFLOXACIN 500 MG PO TABS: ORAL_TABLET | ORAL | Status: DC

## 2015-03-22 MED ORDER — CALCIUM GLUCONATE 10 % IV SOLN
1.0000 g | Freq: Once | INTRAVENOUS | Status: DC
  Filled 2015-03-22: qty 10

## 2015-03-22 MED ORDER — ALBUTEROL SULFATE (2.5 MG/3ML) 0.083% IN NEBU
10.0000 mg | INHALATION_SOLUTION | Freq: Once | RESPIRATORY_TRACT | Status: DC
  Filled 2015-03-22: qty 12

## 2015-03-22 MED ORDER — DOXERCALCIFEROL 4 MCG/2ML IV SOLN: 10.0000 ug | INTRAVENOUS | Status: DC

## 2015-03-22 MED ORDER — OXYCODONE HCL 5 MG PO TABS
5.0000 mg | ORAL_TABLET | Freq: Once | ORAL | Status: DC
  Filled 2015-03-22: qty 1

## 2015-03-22 MED ORDER — NEPRO/CARBSTEADY PO LIQD
237.0000 mL | ORAL | Status: DC | PRN
  Filled 2015-03-22: qty 237

## 2015-03-22 MED ORDER — HEPARIN SODIUM (PORCINE) 1000 UNIT/ML DIALYSIS
1000.0000 [IU] | INTRAMUSCULAR | Status: DC | PRN
  Filled 2015-03-22: qty 1

## 2015-03-22 MED ORDER — PENTAFLUOROPROP-TETRAFLUOROETH EX AERO
1.0000 "application " | INHALATION_SPRAY | CUTANEOUS | Status: DC | PRN
  Filled 2015-03-22: qty 30

## 2015-03-22 MED ORDER — DEXTROSE 50 % IV SOLN: 1.0000 | Freq: Once | INTRAVENOUS | Status: DC

## 2015-03-22 MED ORDER — INSULIN ASPART 100 UNIT/ML IV SOLN
5.0000 [IU] | Freq: Once | INTRAVENOUS | Status: DC
  Filled 2015-03-22: qty 1

## 2015-03-22 MED ORDER — ALTEPLASE 2 MG IJ SOLR
2.0000 mg | Freq: Once | INTRAMUSCULAR | Status: DC | PRN
  Filled 2015-03-22: qty 2

## 2015-03-22 MED ORDER — HEPARIN SODIUM (PORCINE) 1000 UNIT/ML DIALYSIS
20.0000 [IU]/kg | INTRAMUSCULAR | Status: DC | PRN
  Filled 2015-03-22: qty 2

## 2015-03-22 MED ORDER — LIDOCAINE-PRILOCAINE 2.5-2.5 % EX CREA
1.0000 "application " | TOPICAL_CREAM | CUTANEOUS | Status: DC | PRN
  Filled 2015-03-22: qty 5

## 2015-03-22 NOTE — ED Notes (Signed)
Pt vomiting at discharge; reporting being too hot; requesting to be discharged to go home.

## 2015-03-22 NOTE — ED Notes (Signed)
Pt refuses all medications to help reduce Potassium levels.

## 2015-03-22 NOTE — Discharge Instructions (Signed)
Take levaquin every other day after dialysis for 5 doses.   Go to your dialysis center on Monday.   See your doctor.   Return to ER if you have worse cough, chest pain, trouble breathing.

## 2015-03-22 NOTE — ED Provider Notes (Signed)
  Physical Exam  BP 114/52 mmHg  Pulse 114  Temp(Src) 97 F (36.1 C) (Temporal)  Resp 18  Wt 169 lb 12.1 oz (77 kg)  SpO2 100%  Physical Exam  ED Course  Procedures  MDM Patient came down from dialysis about 10 min ago. Seen in the ED earlier for cough, chest pain. WBC 13, CXR showed possible pneumonia. Previous provider not concerned for ACS. On exam, minimal L base crackles. Never hypoxic. Still slightly tachy. K was 6.5 but patient was dialyzed. I don't think she needs to be admitted for pneumonia. Will dc home with levaquin.     Richardean Canal, MD 03/22/15 [phone]

## 2015-03-22 NOTE — ED Notes (Signed)
Pt returned from dialysis c/o weakness and NV. Reports taking Kayexalate earlier and vomiting "several times" in dialysis. Attempted to give levaquin. Pt reports feeling nausea and wants "to hold off for right not."

## 2015-03-22 NOTE — ED Notes (Signed)
Pt reports that she started having chest pain and SOb on Wednesday. States she has had similar pain in the past. Reports she is seen downstairs in the heart failure clinic. Dr. B is her cardiologist. Also reports some n/v.

## 2015-03-22 NOTE — ED Provider Notes (Signed)
CSN: [id number]     Arrival date & time 03/22/15  1150 History   None    Chief Complaint  Patient presents with  . Chest Pain  . Cough   HPI  Ms. Wiltsie is a 44yo woman with PMHx of HTN, ESRD on HD, cirrhosis, and anemia of chronic disease who presents to the ED with chest pain and cough. Patient states her chest pain and cough have been present since Wednesday. She describes the chest pain as mild, left-sided, "piercing", non-radiating, and associated with shortness of breath. She states the chest pain is worse when coughing. She states she gets chest pain all the time with dialysis. She notes she did not go to dialysis on Monday because she went to a funeral and then only had 2 hours of dialysis on Wed due to the chest pain and intolerance to bicarb. She notes she has been coughing up yellowish phlegm. She denies fevers, chills, sore throat. She also reports some nausea and vomiting.    Past Medical History  Diagnosis Date  . 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 Hypertension   . ESRD on hemodialysis 04/20/2014    Patient started HD in 1998.  She has been dialyzed in [place] at "[facility]" until moving to [place] in July 2015 to live with family.  She has had failed accesses in the L arm.  No attempt to place access was made in the R arm, patient is not sure why.  She has a L thigh AVG which she says has been functional for 7-8 years.  They stopped giving her heparin several years ago due to prolonged access bleeding.    Marland Kitchen Heart murmur   . Anemia of chronic disease   . History of blood transfusion     "several; w/transplant, infections, etc"  . Headache(784.0)     "often; sometimes daily" (04/26/2014)  . Migraines     "qod" (04/26/2014)  . ESRD on hemodialysis 04/20/2014    Patient started HD in 1998.  She has been dialyzed in [place] at "[facility]" until moving to [place] in July 2015 to live with family.  She had a transplant in 2012 which  didn't take and it was removed in 2013.  She has had failed accesses in the L arm.  No attempt to place access was made in the R arm, patient is not sure why.  She has a L thigh AVG which she says has been functional for  . Ascites   . Pulmonary hypertension   . Ascites   . Cirrhosis    Past Surgical History  Procedure Laterality Date  . Arteriovenous graft placement Left 2013    thigh  . 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 Thoracentesis  last done ~ 2 weeks ago  . Thrombectomy and revision of arterioventous (av) goretex  graft Left 01/08/2015    Procedure: THROMBECTOMY AND REVISION OF ARTERIOVENTOUS (AV) THIGH GORETEX  GRAFT;  Surgeon: Chuck Hint, MD;  Location: [facility] OR;  Service: Vascular;  Laterality: Left;   Family History  Problem Relation Age of Onset  . Healthy Mother   . Liver disease Maternal Uncle     drinker  . Asthma Maternal Grandmother    History  Substance Use Topics  . Smoking status: Never Smoker   . Smokeless tobacco: Never Used  . Alcohol Use: No  OB History    No data available     Review of Systems General: Denies night sweats, changes in weight, changes in appetite HEENT: Denies headaches, ear pain, changes in vision, rhinorrhea CV: Denies palpitations, orthopnea Pulm: See HPI GI: Denies diarrhea, constipation, melena, hematochezia GU: Denies dysuria, hematuria, frequency Msk: Denies muscle cramps, joint pains Neuro: Denies weakness, numbness, tingling Skin: Denies rashes, bruising   Allergies  Contrast media; Procardia; and Vancomycin  Home Medications   Prior to Admission medications   Medication Sig Start Date End Date Taking? Authorizing Provider  acetaminophen (TYLENOL) 500 MG tablet Take 500 mg by mouth every 6 (six) hours as needed for moderate pain.    Historical Provider, MD  amLODipine (NORVASC) 5 MG tablet Take 1 tablet (5 mg total) by mouth daily. 01/15/15   Maryann  Mikhail, DO  aspirin EC 81 MG tablet Take 1 tablet (81 mg total) by mouth daily. 09/01/14   Richarda Overlie, MD  atorvastatin (LIPITOR) 20 MG tablet Take 1 tablet (20 mg total) by mouth daily. 01/15/15   Maryann Mikhail, DO  calcium acetate (PHOSLO) 667 MG capsule Take 667 mg by mouth 3 (three) times daily with meals.     Historical Provider, MD  cloNIDine (CATAPRES) 0.3 MG tablet Take 0.3 mg by mouth daily.  01/07/15   Historical Provider, MD  esomeprazole (NEXIUM) 40 MG capsule Take 1 capsule (40 mg total) by mouth daily at 12 noon. 09/06/14   Richarda Overlie, MD  oxyCODONE (OXY IR/ROXICODONE) 5 MG immediate release tablet Take 1-2 tablets (5-10 mg total) by mouth every 4 (four) hours as needed for moderate pain. 01/09/15   Lars Mage, PA-C  Skin Protectants, Misc. (EUCERIN) cream Apply 1 application topically daily as needed for dry skin.     Historical Provider, MD  traMADol (ULTRAM) 50 MG tablet Take 1 tablet (50 mg total) by mouth every 12 (twelve) hours as needed for severe pain. 12/21/14   Christiane Ha, MD  zolpidem (AMBIEN) 5 MG tablet Take 5 mg by mouth at bedtime as needed. 12/27/14   Historical Provider, MD   BP 133/82 mmHg  Pulse 116  Temp(Src) 97.9 F (36.6 C) (Oral)  Resp 20  Wt 179 lb 2 oz (81.251 kg)  SpO2 95% Physical Exam General: middle aged woman sitting up in bed, NAD HEENT: [place]/AT, EOMI, pharynx non-erythematous, mucus membranes moist CV: tachycardic in 110s, 3/6 systolic murmur heard best at LUSB Pulm: CTA bilaterally, breaths non-labored Abd: BS+, soft, moderately distended, non-tender Ext: warm, 1+ edema bilaterally Neuro: alert and oriented x 3, no focal deficits  ED Course  Procedures (including critical care time) Labs Review Labs Reviewed  CBC - Abnormal; Notable for the following:    WBC 14.1 (*)    All other components within normal limits  BRAIN NATRIURETIC PEPTIDE - Abnormal; Notable for the following:    B Natriuretic Peptide 1475.4 (*)    All other  components within normal limits  BASIC METABOLIC PANEL - Abnormal; Notable for the following:    Potassium 6.5 (*)    Chloride 94 (*)    CO2 17 (*)    Glucose, Bld 106 (*)    BUN 151 (*)    Creatinine, Ser 16.85 (*)    Calcium 7.9 (*)    GFR calc non Af Amer 2 (*)    GFR calc Af Amer 3 (*)    Anion gap 24 (*)    All other components within normal limits  I-STAT  TROPOININ, ED    Imaging Review Dg Chest 2 View  03/22/2015   CLINICAL DATA:  Chest pain and shortness of breath for 2 days. Personal history of heart failure.  EXAM: CHEST - 2 VIEW  COMPARISON:  Two-view chest 01/13/2015  FINDINGS: The heart is enlarged. A chronic right pleural effusion is again noted. The a right basilar pleural plaque is not visualized. Mild left basilar airspace opacity is noted. The upper lungs are clear. A hemodialysis shunt is noted in the left upper extremity.  IMPRESSION: 1. Stable cardiomegaly. 2. Chronic right pleural effusion. 3. Minimal left basilar airspace disease. While this likely reflects atelectasis, early infection is also considered.   Electronically Signed   By: Marin Roberts M.D.   On: 03/22/2015 12:32     EKG Interpretation   Date/Time:  Friday March 22 2015 11:59:28 EDT Ventricular Rate:  119 PR Interval:    QRS Duration: 128 QT Interval:  376 QTC Calculation: 528 R Axis:   77 Text Interpretation:  Undetermined rhythm Non-specific intra-ventricular  conduction block Nonspecific T wave abnormality Abnormal ECG Confirmed by  DOCHERTY  MD, MEGAN ([id number]) on 03/22/2015 12:24:52 PM      MDM   Final diagnoses:  None    44yo woman with hx ESRD on HD presenting with 3 day hx of chest pain/cough found to have hyperkalemia of 6.5. ACS not likely as having chest pain for 3 days now and troponin negative. EKG without any evidence of ischemia, unchanged from prior. Will start hyperkalemia treatment with calcium gluconate, novolog 5 units, d50, and albuterol. Will consult renal for  dialysis.   Patient refusing calcium gluconate, novolog, and albuterol treatments. Renal will see patient for dialysis.   Toy Cookey, MD 03/27/15 1058

## 2015-03-28 ENCOUNTER — Inpatient Hospital Stay (HOSPITAL_COMMUNITY)
Admission: EM | Admit: 2015-03-28 | Discharge: 2015-04-07 | DRG: 252 | Disposition: A | Discharge status: Other Acute Inpatient Hospital | Payer: Medicare Other | Attending: Internal Medicine | Admitting: Internal Medicine

## 2015-03-28 ENCOUNTER — Encounter (HOSPITAL_COMMUNITY): Payer: Self-pay | Admitting: Cardiology

## 2015-03-28 ENCOUNTER — Emergency Department (HOSPITAL_COMMUNITY): Payer: Medicare Other

## 2015-03-28 DIAGNOSIS — I509 Heart failure, unspecified: Secondary | ICD-10-CM | POA: Diagnosis present

## 2015-03-28 DIAGNOSIS — K766 Portal hypertension: Secondary | ICD-10-CM | POA: Diagnosis present

## 2015-03-28 DIAGNOSIS — Z515 Encounter for palliative care: Secondary | ICD-10-CM | POA: Diagnosis not present

## 2015-03-28 DIAGNOSIS — I319 Disease of pericardium, unspecified: Secondary | ICD-10-CM | POA: Diagnosis not present

## 2015-03-28 DIAGNOSIS — T8612 Kidney transplant failure: Secondary | ICD-10-CM | POA: Diagnosis present

## 2015-03-28 DIAGNOSIS — R778 Other specified abnormalities of plasma proteins: Secondary | ICD-10-CM | POA: Diagnosis present

## 2015-03-28 DIAGNOSIS — R079 Chest pain, unspecified: Secondary | ICD-10-CM | POA: Diagnosis not present

## 2015-03-28 DIAGNOSIS — I9589 Other hypotension: Secondary | ICD-10-CM | POA: Diagnosis present

## 2015-03-28 DIAGNOSIS — F4329 Adjustment disorder with other symptoms: Secondary | ICD-10-CM | POA: Diagnosis not present

## 2015-03-28 DIAGNOSIS — Z79899 Other long term (current) drug therapy: Secondary | ICD-10-CM

## 2015-03-28 DIAGNOSIS — I313 Pericardial effusion (noninflammatory): Secondary | ICD-10-CM | POA: Diagnosis present

## 2015-03-28 DIAGNOSIS — I314 Cardiac tamponade: Secondary | ICD-10-CM | POA: Diagnosis not present

## 2015-03-28 DIAGNOSIS — K7460 Unspecified cirrhosis of liver: Secondary | ICD-10-CM | POA: Diagnosis present

## 2015-03-28 DIAGNOSIS — I272 Other secondary pulmonary hypertension: Secondary | ICD-10-CM | POA: Diagnosis present

## 2015-03-28 DIAGNOSIS — M6250 Muscle wasting and atrophy, not elsewhere classified, unspecified site: Secondary | ICD-10-CM | POA: Diagnosis present

## 2015-03-28 DIAGNOSIS — K219 Gastro-esophageal reflux disease without esophagitis: Secondary | ICD-10-CM | POA: Diagnosis present

## 2015-03-28 DIAGNOSIS — Z992 Dependence on renal dialysis: Secondary | ICD-10-CM | POA: Diagnosis not present

## 2015-03-28 DIAGNOSIS — D631 Anemia in chronic kidney disease: Secondary | ICD-10-CM | POA: Diagnosis present

## 2015-03-28 DIAGNOSIS — I214 Non-ST elevation (NSTEMI) myocardial infarction: Secondary | ICD-10-CM | POA: Diagnosis present

## 2015-03-28 DIAGNOSIS — Z6826 Body mass index (BMI) 26.0-26.9, adult: Secondary | ICD-10-CM | POA: Diagnosis not present

## 2015-03-28 DIAGNOSIS — I441 Atrioventricular block, second degree: Secondary | ICD-10-CM | POA: Diagnosis present

## 2015-03-28 DIAGNOSIS — I120 Hypertensive chronic kidney disease with stage 5 chronic kidney disease or end stage renal disease: Secondary | ICD-10-CM | POA: Diagnosis present

## 2015-03-28 DIAGNOSIS — I728 Aneurysm of other specified arteries: Secondary | ICD-10-CM | POA: Diagnosis not present

## 2015-03-28 DIAGNOSIS — I10 Essential (primary) hypertension: Secondary | ICD-10-CM | POA: Diagnosis present

## 2015-03-28 DIAGNOSIS — F4323 Adjustment disorder with mixed anxiety and depressed mood: Secondary | ICD-10-CM | POA: Diagnosis present

## 2015-03-28 DIAGNOSIS — E875 Hyperkalemia: Secondary | ICD-10-CM | POA: Diagnosis present

## 2015-03-28 DIAGNOSIS — I5033 Acute on chronic diastolic (congestive) heart failure: Secondary | ICD-10-CM | POA: Diagnosis present

## 2015-03-28 DIAGNOSIS — K7031 Alcoholic cirrhosis of liver with ascites: Secondary | ICD-10-CM | POA: Diagnosis not present

## 2015-03-28 DIAGNOSIS — N2581 Secondary hyperparathyroidism of renal origin: Secondary | ICD-10-CM | POA: Diagnosis present

## 2015-03-28 DIAGNOSIS — N186 End stage renal disease: Secondary | ICD-10-CM | POA: Diagnosis present

## 2015-03-28 DIAGNOSIS — R7989 Other specified abnormal findings of blood chemistry: Secondary | ICD-10-CM

## 2015-03-28 DIAGNOSIS — Y830 Surgical operation with transplant of whole organ as the cause of abnormal reaction of the patient, or of later complication, without mention of misadventure at the time of the procedure: Secondary | ICD-10-CM | POA: Diagnosis present

## 2015-03-28 DIAGNOSIS — R0602 Shortness of breath: Secondary | ICD-10-CM | POA: Diagnosis present

## 2015-03-28 DIAGNOSIS — Z9115 Patient's noncompliance with renal dialysis: Secondary | ICD-10-CM | POA: Diagnosis present

## 2015-03-28 DIAGNOSIS — I4581 Long QT syndrome: Secondary | ICD-10-CM

## 2015-03-28 DIAGNOSIS — R188 Other ascites: Secondary | ICD-10-CM | POA: Diagnosis present

## 2015-03-28 DIAGNOSIS — Z9119 Patient's noncompliance with other medical treatment and regimen: Secondary | ICD-10-CM | POA: Diagnosis present

## 2015-03-28 DIAGNOSIS — T82838A Hemorrhage of vascular prosthetic devices, implants and grafts, initial encounter: Secondary | ICD-10-CM | POA: Diagnosis not present

## 2015-03-28 DIAGNOSIS — Z7982 Long term (current) use of aspirin: Secondary | ICD-10-CM | POA: Diagnosis not present

## 2015-03-28 DIAGNOSIS — Z91199 Patient's noncompliance with other medical treatment and regimen due to unspecified reason: Secondary | ICD-10-CM

## 2015-03-28 LAB — BASIC METABOLIC PANEL
Anion gap: 21 — ABNORMAL HIGH (ref 5–15)
BUN: 131 mg/dL — ABNORMAL HIGH (ref 6–20)
CO2: 19 mmol/L — AB (ref 22–32)
Calcium: 7.3 mg/dL — ABNORMAL LOW (ref 8.9–10.3)
Chloride: 95 mmol/L — ABNORMAL LOW (ref 101–111)
Creatinine, Ser: 16.34 mg/dL — ABNORMAL HIGH (ref 0.44–1.00)
GFR calc non Af Amer: 2 mL/min — ABNORMAL LOW (ref >60)
GFR, EST AFRICAN AMERICAN: 3 mL/min — AB (ref >60)
Glucose, Bld: 101 mg/dL — ABNORMAL HIGH (ref 65–99)
Potassium: 5.6 mmol/L — ABNORMAL HIGH (ref 3.5–5.1)
SODIUM: 135 mmol/L (ref 135–145)

## 2015-03-28 LAB — CBC
HCT: 30 % — ABNORMAL LOW (ref 36.0–46.0)
Hemoglobin: 9.8 g/dL — ABNORMAL LOW (ref 12.0–15.0)
MCH: 27.8 pg (ref 26.0–34.0)
MCHC: 32.7 g/dL (ref 30.0–36.0)
MCV: 85.2 fL (ref 78.0–100.0)
Platelets: 242 10*3/uL (ref 150–400)
RBC: 3.52 MIL/uL — ABNORMAL LOW (ref 3.87–5.11)
RDW: 14.9 % (ref 11.5–15.5)
WBC: 11.8 10*3/uL — ABNORMAL HIGH (ref 4.0–10.5)

## 2015-03-28 LAB — BRAIN NATRIURETIC PEPTIDE: B Natriuretic Peptide: 831.2 pg/mL — ABNORMAL HIGH (ref 0.0–100.0)

## 2015-03-28 LAB — I-STAT TROPONIN, ED: Troponin i, poc: 1.21 ng/mL (ref 0.00–0.08)

## 2015-03-28 LAB — TROPONIN I: Troponin I: 1.13 ng/mL (ref <0.031)

## 2015-03-28 MED ORDER — HEPARIN SODIUM (PORCINE) 5000 UNIT/ML IJ SOLN
5000.0000 [IU] | Freq: Three times a day (TID) | INTRAMUSCULAR | Status: DC
  Filled 2015-03-28 (×13): qty 1

## 2015-03-28 MED ORDER — SENNOSIDES-DOCUSATE SODIUM 8.6-50 MG PO TABS
2.0000 | ORAL_TABLET | Freq: Two times a day (BID) | ORAL | Status: DC
  Administered 2015-03-29 – 2015-04-06 (×7): 2 via ORAL
  Filled 2015-03-28 (×21): qty 2

## 2015-03-28 MED ORDER — FENTANYL CITRATE (PF) 100 MCG/2ML IJ SOLN
25.0000 ug | INTRAMUSCULAR | Status: DC | PRN
Start: 2015-03-28 — End: 2015-03-29
  Administered 2015-03-28 – 2015-03-29 (×3): 25 ug via INTRAVENOUS
  Filled 2015-03-28 (×3): qty 2

## 2015-03-28 NOTE — Consult Note (Addendum)
[facility] Renal Consultation Note    Indication for Consultation:  Management of ESRD/hemodialysis; anemia, hypertension/volume and secondary hyperparathyroidism  HPI: Holly Mendoza is a 43 y.o. 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  She had a history of noncompliance, ascites, pulmonary hypertension and uncontrolled secondary hypertension since she moved here which has only worsened due to severe underdialysis related to her belief that the Naturalyte dialysis is the cause for all her problems. She states it makes her burn during HD treatments. She has HA, N, V, cramps, rashes and itching. She feels like the Naturalyte dialysate is the cause of her body disfiguration.   She has required intermittent paracentesis and plans to stop. She has had numerous discussions with various providers and especially Dr. Arrie Aran about this. She agreed to hospice consult and has talked to the Hospice physician earlier this week.  She presented to the ED today due to SOB related to missed HD. She only wants ultrafiltration today.  Past Medical History  Diagnosis Date  . 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 Hypertension   . ESRD on hemodialysis 04/20/2014    Patient started HD in 1998.  She has been dialyzed in [place] at "[facility]" until moving to [place] in July 2015 to live with family.  She has had failed accesses in the L arm.  No attempt to place access was made in the R arm, patient is not sure why.  She has a L thigh AVG which she says has been functional for 7-8 years.  They stopped giving her heparin several years ago due to prolonged access bleeding.    Marland Kitchen Heart murmur   . Anemia of chronic disease   . History of blood transfusion     "several; w/transplant, infections, etc"  . Headache(784.0)     "often; sometimes  daily" (04/26/2014)  . Migraines     "qod" (04/26/2014)  . ESRD on hemodialysis 04/20/2014    Patient started HD in 1998.  She has been dialyzed in [place] at "[facility]" until moving to [place] in July 2015 to live with family.  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  She has had failed accesses in the L arm.  No attempt to place access was made in the R arm, patient is not sure why.  She has a L thigh AVG which she says has been functional for  . Ascites   . Pulmonary hypertension   . Ascites   . Cirrhosis    Past Surgical History  Procedure Laterality Date  . Arteriovenous graft placement Left 2013    thigh  . 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 Thoracentesis  last done ~ 2 weeks ago  . Thrombectomy and revision of arterioventous (av) goretex  graft Left 01/08/2015    Procedure: THROMBECTOMY AND REVISION OF ARTERIOVENTOUS (AV) THIGH GORETEX  GRAFT;  Surgeon: Chuck Hint, MD;  Location: [facility] OR;  Service: Vascular;  Laterality: Left;   Family History  Problem Relation Age of Onset  . Healthy Mother   . Liver disease Maternal Uncle     drinker  . Asthma Maternal Grandmother    Social History:  reports that  she has never smoked. She has never used smokeless tobacco. She reports that she does not drink alcohol or use illicit drugs. Allergies  Allergen Reactions  . Contrast Media [Iodinated Diagnostic Agents] Anaphylaxis  . Procardia [Nifedipine] Other (See Comments)    Sweating, BP drops dangerously low,  . Vancomycin Anaphylaxis and Hives   Prior to Admission medications   Medication Sig Start Date End Date Taking? Authorizing Provider  acetaminophen (TYLENOL) 500 MG tablet Take 500 mg by mouth every 6 (six) hours as needed for moderate pain.   Yes Historical Provider, MD  amLODipine (NORVASC) 5 MG tablet Take 1 tablet (5 mg total) by mouth daily. 01/15/15  Yes Maryann Mikhail, DO   aspirin EC 81 MG tablet Take 1 tablet (81 mg total) by mouth daily. 09/01/14  Yes Richarda Overlie, MD  atorvastatin (LIPITOR) 20 MG tablet Take 1 tablet (20 mg total) by mouth daily. 01/15/15  Yes Maryann Mikhail, DO  calcium acetate (PHOSLO) 667 MG capsule Take 667 mg by mouth 3 (three) times daily with meals.    Yes Historical Provider, MD  cloNIDine (CATAPRES) 0.3 MG tablet Take 0.3 mg by mouth daily.  01/07/15  Yes Historical Provider, MD  esomeprazole (NEXIUM) 40 MG capsule Take 1 capsule (40 mg total) by mouth daily at 12 noon. 09/06/14  Yes Richarda Overlie, MD  levofloxacin (LEVAQUIN) 500 MG tablet Take it every other day after dialysis for 5 doses 03/22/15  Yes Richardean Canal, MD  Skin Protectants, Misc. (EUCERIN) cream Apply 1 application topically daily as needed for dry skin.    Yes Historical Provider, MD  zolpidem (AMBIEN) 5 MG tablet Take 5 mg by mouth at bedtime as needed for sleep.  12/27/14  Yes Historical Provider, MD  oxyCODONE (OXY IR/ROXICODONE) 5 MG immediate release tablet Take 1-2 tablets (5-10 mg total) by mouth every 4 (four) hours as needed for moderate pain. Patient not taking: Reported on 03/28/2015 01/09/15   Lars Mage, PA-C  traMADol (ULTRAM) 50 MG tablet Take 1 tablet (50 mg total) by mouth every 12 (twelve) hours as needed for severe pain. Patient not taking: Reported on 03/28/2015 12/21/14   Christiane Ha, MD   Current Facility-Administered Medications  Medication Dose Route Frequency Provider Last Rate Last Dose  . heparin injection 5,000 Units  5,000 Units Subcutaneous 3 times per day Albertine Grates, MD      . senna-docusate (Senokot-S) tablet 2 tablet  2 tablet Oral BID Albertine Grates, MD       Current Outpatient Prescriptions  Medication Sig Dispense Refill  . acetaminophen (TYLENOL) 500 MG tablet Take 500 mg by mouth every 6 (six) hours as needed for moderate pain.    Marland Kitchen amLODipine (NORVASC) 5 MG tablet Take 1 tablet (5 mg total) by mouth daily. 30 tablet 0  . aspirin EC 81 MG  tablet Take 1 tablet (81 mg total) by mouth daily. 30 tablet 2  . atorvastatin (LIPITOR) 20 MG tablet Take 1 tablet (20 mg total) by mouth daily. 30 tablet 0  . calcium acetate (PHOSLO) 667 MG capsule Take 667 mg by mouth 3 (three) times daily with meals.     . cloNIDine (CATAPRES) 0.3 MG tablet Take 0.3 mg by mouth daily.   6  . esomeprazole (NEXIUM) 40 MG capsule Take 1 capsule (40 mg total) by mouth daily at 12 noon. 30 capsule 2  . levofloxacin (LEVAQUIN) 500 MG tablet Take it every other day after dialysis for 5 doses 5 tablet  0  . Skin Protectants, Misc. (EUCERIN) cream Apply 1 application topically daily as needed for dry skin.     Marland Kitchen zolpidem (AMBIEN) 5 MG tablet Take 5 mg by mouth at bedtime as needed for sleep.   2  . oxyCODONE (OXY IR/ROXICODONE) 5 MG immediate release tablet Take 1-2 tablets (5-10 mg total) by mouth every 4 (four) hours as needed for moderate pain. (Patient not taking: Reported on 03/28/2015) 30 tablet 0  . traMADol (ULTRAM) 50 MG tablet Take 1 tablet (50 mg total) by mouth every 12 (twelve) hours as needed for severe pain. (Patient not taking: Reported on 03/28/2015) 20 tablet 0   Labs: Basic Metabolic Panel:  Recent Labs Lab 03/22/15 1210 03/22/15 1630 03/28/15 1450  NA 135 134* 135  K 6.5* 6.1* 5.6*  CL 94* 96* 95*  CO2 17* 17* 19*  GLUCOSE 106* 103* 101*  BUN 151* 151* 131*  CREATININE 16.85* 17.08* 16.34*  CALCIUM 7.9* 7.9* 7.3*  PHOS  --  13.2*  --    Liver Function Tests:  Recent Labs Lab 03/22/15 1630  ALBUMIN 1.7*   Recent Labs Lab 03/22/15 1210 03/22/15 1630 03/28/15 1450  WBC 14.1* 13.6* 11.8*  HGB 12.3 11.0* 9.8*  HCT 37.5 33.0* 30.0*  MCV 85.8 84.4 85.2  PLT 286 304 242   Cardiac Enzymes:  Recent Labs Lab 03/28/15 1450  TROPONINI 1.13*  Studies/Results: Dg Chest 2 View  03/28/2015   CLINICAL DATA:  Increased shortness of breath. End-stage renal disease with dialysis. The patient did not go to dialysis yesterday.  EXAM: CHEST -  2 VIEW  COMPARISON:  Two-view chest 03/22/2015  FINDINGS: The heart is enlarged. Interstitial edema has increased since the prior exam. Small bilateral pleural effusions are noted. Bibasilar airspace disease likely reflects atelectasis.  IMPRESSION: 1. Cardiomegaly and progressive edema and bilateral effusions compatible with congestive heart failure. 2. Bibasilar spur is disease likely reflects atelectasis.   Electronically Signed   By: Marin Roberts M.D.   On: 03/28/2015 15:12    ROS: As per HPI .  Physical Exam: Filed Vitals:   03/28/15 1631 03/28/15 1645 03/28/15 1715 03/28/15 1730  BP: 101/77 110/77 107/70 97/73  Pulse: 110 114    Temp:      TempSrc:      Resp: 22 17 24 24   Weight:      SpO2: 97% 97%       General: AA F breathing easily, NAD Head: Normocephalic, atraumatic, sclera non-icteric, mucus membranes are moist Neck: Supple.+ JVD. Lungs: Dim bases + crackles Breathing is unlabored. Heart: tachy Abdomen: marked abdominal distension, massive ascites, striae M-S:  Marked muscle wasting Lower extremities +++ edema  Neuro: Alert and oriented X 3. Moves all extremities spontaneously. Psych:  Responds to questions appropriately with a normal affect. Dialysis Access: left thigh AVGG + bruit  Dialysis Orders: [facility] MWF 4 hr- rarely runs > 1.5 hours 1 K 2.5 Ca left thigh AVGG NO heparin Refuses Meds most of the time, EDW 72.5  Assessment/Plan: 1.  SOB - CXR showed excess pulmonary edema and bilateral efffusion - agrees to HD today - goal set for 4 L , truly needs daily HD; re-evaluate in the AM. 2.  ESRD - with hyperkalemia 5.6 and uremia (BUN 130) symptoms - due to underdialysis related to failure to attend HD and also stay on treatments. Has been told she needs to have HD AND ultrafiltration. 3.  Hypertension/volume  - hard to know true edw - BP  lower than usual today  4.  Anemia  - hold on ESA for now; HGb ok 5.  Metabolic bone disease -  Hold on Hectorol for now 6.   Nutrition - low alb - poor nutrition - uremia contributed to nause and poor intake 7. Ascites - secondary to CHF/pul HTN only made worse by years of inadequate HD 8.   Goals of care - palliative care consult; seen by outpt Hospice MD earlier this week.   Sheffield Slider, PA-C [facility] Beeper [phone] 03/28/2015, 5:49 PM       I have seen and examined this patient and agree with plan as outlined by M. Doran Durand, PA-C.  Miss Janco is well known to me and has a long history of nonadherence with HD (often signing off after only 2 hours or skipping altogether).  She presents with worsening SOB and fatigue, all of which can be explained by her volume overload and uremia due to lack of dialysis.  Her main issue is that she believes the Natralyte dialysate causes her pain with HD and has had trouble over the last several years.  She also has CHF and severe pulmonary HTN both exacerbated by her inability to sit through full treatments and ultrafiltration.  We have discussed palliative care and hospice and she is considering stopping dialysis and going to hospice, however she wants to try and find an alternative to natralyte before stopping dialysis.  We will try to find an alternative, however we may not have any.  Palliative care has been involved in her care.  We also discussed that given her hyperkalemia and uremia, ultrafiltration alone would not be appropriate and she has agreed to hemodialysis. Andruw Battie A,MD 03/28/2015 8:46 PM

## 2015-03-28 NOTE — ED Notes (Signed)
Nephrology MD at bedside

## 2015-03-28 NOTE — H&P (Signed)
History and Physical  Holly Mendoza ZOX:[id number] DOB: 08/24/1971 DOA: 03/28/2015  Referring physician: EDP PCP: Dorrene German, MD   Chief Complaint: sob  HPI: Holly Mendoza is a 44 y.o. female  H/o ESRD, failed renal transplant, on HD, h/o cirrhosis of unclear etiology, presents to the emergency department for evaluation of cough and shortness of breath. Patient reports that she has had progressively worsening shortness of breath over the past week, she was given levaquin  For possible pna, however, she reported not taking it, she denies fever, she reported constant chest pain for the last week, reported worsening of chronic ascites, she reported that she just started to work with home hospice because she is tired of going through what she have been through. She reported miss her last dialysis.  ED course, she was found with fluids overload, elevation of troponin, mild elevation of potassium, ekg with ? Mobitz I with QT prolongation of 520, nephro consulted, patient agreed with emergent dialysis, hospice also talked to patient in the ED, she decided to get dialysis , then work with hospice to get all the paper work done tomorrow. She does not wish to have paracentesis done,  She does not wish to have aggressive cardiac intervention either, however, currently she remain full code until she make decision tomorrow.  hospitalist called for admission.   Review of Systems:  Detail per HPI, Review of systems are otherwise negative  Past Medical History  Diagnosis Date  . 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 Hypertension   . ESRD on hemodialysis 04/20/2014    Patient started HD in 1998.  She has been dialyzed in [place] at "[facility]" until moving to [place] in July 2015 to live with family.  She has had failed accesses in the L arm.  No attempt to place access was made in the R arm, patient is not sure why.  She has a L thigh AVG which she says has  been functional for 7-8 years.  They stopped giving her heparin several years ago due to prolonged access bleeding.    Marland Kitchen Heart murmur   . Anemia of chronic disease   . History of blood transfusion     "several; w/transplant, infections, etc"  . Headache(784.0)     "often; sometimes daily" (04/26/2014)  . Migraines     "qod" (04/26/2014)  . ESRD on hemodialysis 04/20/2014    Patient started HD in 1998.  She has been dialyzed in [place] at "[facility]" until moving to [place] in July 2015 to live with family.  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  She has had failed accesses in the L arm.  No attempt to place access was made in the R arm, patient is not sure why.  She has a L thigh AVG which she says has been functional for  . Ascites   . Pulmonary hypertension   . Ascites   . Cirrhosis    Past Surgical History  Procedure Laterality Date  . Arteriovenous graft placement Left 2013    thigh  . 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 Thoracentesis  last done ~ 2 weeks ago  . Thrombectomy and revision of arterioventous (av) goretex  graft Left 01/08/2015    Procedure: THROMBECTOMY AND REVISION OF ARTERIOVENTOUS (AV) THIGH GORETEX  GRAFT;  Surgeon: Chuck Hint,  MD;  Location: [facility] OR;  Service: Vascular;  Laterality: Left;   Social History:  reports that she has never smoked. She has never used smokeless tobacco. She reports that she does not drink alcohol or use illicit drugs. Patient lives at home & is able to participate in activities of daily living independently   Allergies  Allergen Reactions  . Contrast Media [Iodinated Diagnostic Agents] Anaphylaxis  . Procardia [Nifedipine] Other (See Comments)    Sweating, BP drops dangerously low,  . Vancomycin Anaphylaxis and Hives    Family History  Problem Relation Age of Onset  . Healthy Mother   . Liver disease Maternal Uncle     drinker    . Asthma Maternal Grandmother       Prior to Admission medications   Medication Sig Start Date End Date Taking? Authorizing Provider  acetaminophen (TYLENOL) 500 MG tablet Take 500 mg by mouth every 6 (six) hours as needed for moderate pain.   Yes Historical Provider, MD  amLODipine (NORVASC) 5 MG tablet Take 1 tablet (5 mg total) by mouth daily. 01/15/15  Yes Maryann Mikhail, DO  aspirin EC 81 MG tablet Take 1 tablet (81 mg total) by mouth daily. 09/01/14  Yes Richarda Overlie, MD  atorvastatin (LIPITOR) 20 MG tablet Take 1 tablet (20 mg total) by mouth daily. 01/15/15  Yes Maryann Mikhail, DO  calcium acetate (PHOSLO) 667 MG capsule Take 667 mg by mouth 3 (three) times daily with meals.    Yes Historical Provider, MD  cloNIDine (CATAPRES) 0.3 MG tablet Take 0.3 mg by mouth daily.  01/07/15  Yes Historical Provider, MD  esomeprazole (NEXIUM) 40 MG capsule Take 1 capsule (40 mg total) by mouth daily at 12 noon. 09/06/14  Yes Richarda Overlie, MD  levofloxacin (LEVAQUIN) 500 MG tablet Take it every other day after dialysis for 5 doses 03/22/15  Yes Richardean Canal, MD  Skin Protectants, Misc. (EUCERIN) cream Apply 1 application topically daily as needed for dry skin.    Yes Historical Provider, MD  zolpidem (AMBIEN) 5 MG tablet Take 5 mg by mouth at bedtime as needed for sleep.  12/27/14  Yes Historical Provider, MD  oxyCODONE (OXY IR/ROXICODONE) 5 MG immediate release tablet Take 1-2 tablets (5-10 mg total) by mouth every 4 (four) hours as needed for moderate pain. Patient not taking: Reported on 03/28/2015 01/09/15   Lars Mage, PA-C  traMADol (ULTRAM) 50 MG tablet Take 1 tablet (50 mg total) by mouth every 12 (twelve) hours as needed for severe pain. Patient not taking: Reported on 03/28/2015 12/21/14   Christiane Ha, MD    Physical Exam: BP 97/73 mmHg  Pulse 114  Temp(Src) 98.6 F (37 C) (Oral)  Resp 24  Wt 76.658 kg (169 lb)  SpO2 97%  General:  In moderate distress due to sob Eyes: PERRL ENT:  unremarkable Neck: supple, + JVD Cardiovascular: tachycardia Respiratory: bibasilar crackles Abdomen: distended, reported feeling tight, but non tender, no guarding, no rebound,  positive bowel sounds Skin: no rash Musculoskeletal:  +edema Psychiatric: calm/cooperative Neurologic: no focal findings            Labs on Admission:  Basic Metabolic Panel:  Recent Labs Lab 03/22/15 1210 03/22/15 1630 03/28/15 1450  NA 135 134* 135  K 6.5* 6.1* 5.6*  CL 94* 96* 95*  CO2 17* 17* 19*  GLUCOSE 106* 103* 101*  BUN 151* 151* 131*  CREATININE 16.85* 17.08* 16.34*  CALCIUM 7.9* 7.9* 7.3*  PHOS  --  13.2*  --    Liver Function Tests:  Recent Labs Lab 03/22/15 1630  ALBUMIN 1.7*   No results for input(s): LIPASE, AMYLASE in the last 168 hours. No results for input(s): AMMONIA in the last 168 hours. CBC:  Recent Labs Lab 03/22/15 1210 03/22/15 1630 03/28/15 1450  WBC 14.1* 13.6* 11.8*  HGB 12.3 11.0* 9.8*  HCT 37.5 33.0* 30.0*  MCV 85.8 84.4 85.2  PLT 286 304 242   Cardiac Enzymes:  Recent Labs Lab 03/28/15 1450  TROPONINI 1.13*    BNP (last 3 results)  Recent Labs  01/13/15 0151 03/22/15 1210 03/28/15 1450  BNP 2116.2* 1475.4* 831.2*    ProBNP (last 3 results)  Recent Labs  08/31/14 1806  PROBNP 46663.0*    CBG: No results for input(s): GLUCAP in the last 168 hours.  Radiological Exams on Admission: Dg Chest 2 View  03/28/2015   CLINICAL DATA:  Increased shortness of breath. End-stage renal disease with dialysis. The patient did not go to dialysis yesterday.  EXAM: CHEST - 2 VIEW  COMPARISON:  Two-view chest 03/22/2015  FINDINGS: The heart is enlarged. Interstitial edema has increased since the prior exam. Small bilateral pleural effusions are noted. Bibasilar airspace disease likely reflects atelectasis.  IMPRESSION: 1. Cardiomegaly and progressive edema and bilateral effusions compatible with congestive heart failure. 2. Bibasilar spur is disease  likely reflects atelectasis.   Electronically Signed   By: Marin Roberts M.D.   On: 03/28/2015 15:12    EKG: Independently reviewed. Mobitz type I? QT c 520, no ST elevation.  Assessment/Plan Present on Admission:  . ESRD (end stage renal disease)  ESRD with fluids overload, ekg changes. Emergent dialysis, plan per nephro.  Elevation of troponin, likely in the setting of renal failure/volume overload. With ekg changes of ? mobitz I and QT prolongation, hopefully will improved with dialysis. Patient does not want to have aggressive measures done ,but remain full code tonight, patient does has high risk of cardiac arrest. Cardiology consulted.  Cirrhosis/chronic ascites: decline paracentesis.  HTN: currently bp low normal, hold all bp meds.  Overall poor prognosis, hospice in the process working with patient.   DVT prophylaxis: subQheparin  Consultants:  nephro Cardiology  Code Status: full , patient likely will change to DNR after talking to hospice tomorrow  Family Communication:  Patient   Disposition Plan: admit to tele  Time spent:  Dayonna Selbe MD, PhD [facility] Pager [phone] If 7PM-7AM, please contact night-coverage at [email or web address], password [facility]

## 2015-03-28 NOTE — Progress Notes (Signed)
Report obtained by this RN

## 2015-03-28 NOTE — ED Notes (Signed)
Pt c/o chest pain rates 10 on pain scale 0/10

## 2015-03-28 NOTE — ED Notes (Addendum)
MD paged regarding pt chest pain

## 2015-03-28 NOTE — ED Notes (Signed)
Pt st's pain is much better, rates pain a #5 on pain scale 0/10

## 2015-03-28 NOTE — ED Notes (Signed)
Unable to obtain IV at this time, phlebotomy notified.

## 2015-03-28 NOTE — ED Notes (Signed)
MD at bedside. 

## 2015-03-28 NOTE — ED Notes (Signed)
Placing call to admitting  MD ref. Pt having chest pain

## 2015-03-28 NOTE — ED Notes (Signed)
Transporting patient to new room assignment. 

## 2015-03-28 NOTE — ED Notes (Addendum)
Md aware of chest pain, per MD Pt should go to dialysis before going to renal floor.  Dialysis notified.  Per dialysis pt will be next.

## 2015-03-28 NOTE — ED Notes (Signed)
Patient transported to X-ray 

## 2015-03-28 NOTE — ED Notes (Signed)
Pt reports increased SOB over the past couple of days. Reports she was recently placed on antibiotics but is not feeling well. Pt reports she is a MWF dialysis pt. States she did not go yesterday.

## 2015-03-28 NOTE — ED Notes (Signed)
Troponin 1.13-MD Pollina aware.

## 2015-03-28 NOTE — ED Provider Notes (Addendum)
CSN: [id number]     Arrival date & time 03/28/15  1347 History   First MD Initiated Contact with Patient 03/28/15 1452     Chief Complaint  Patient presents with  . Shortness of Breath     (Consider location/radiation/quality/duration/timing/severity/associated sxs/prior Treatment) HPI Comments: Patient presents to the emergency department for evaluation of cough and shortness of breath. Patient reports that she has had progressively worsening shortness of breath over the last 2 or 3 days. She is a dialysis patient, last dialyzed 3 days ago (missed yesterday). She is not experiencing any chest pain currently.  Patient is a 44 y.o. female presenting with shortness of breath.  Shortness of Breath Associated symptoms: cough     Past Medical History  Diagnosis Date  . 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 Hypertension   . ESRD on hemodialysis 04/20/2014    Patient started HD in 1998.  She has been dialyzed in [place] at "[facility]" until moving to [place] in July 2015 to live with family.  She has had failed accesses in the L arm.  No attempt to place access was made in the R arm, patient is not sure why.  She has a L thigh AVG which she says has been functional for 7-8 years.  They stopped giving her heparin several years ago due to prolonged access bleeding.    Marland Kitchen Heart murmur   . Anemia of chronic disease   . History of blood transfusion     "several; w/transplant, infections, etc"  . Headache(784.0)     "often; sometimes daily" (04/26/2014)  . Migraines     "qod" (04/26/2014)  . ESRD on hemodialysis 04/20/2014    Patient started HD in 1998.  She has been dialyzed in [place] at "[facility]" until moving to [place] in July 2015 to live with family.  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  She has had failed accesses in the L arm.  No attempt to place access was made in the R arm, patient is not sure why.  She  has a L thigh AVG which she says has been functional for  . Ascites   . Pulmonary hypertension   . Ascites   . Cirrhosis    Past Surgical History  Procedure Laterality Date  . Arteriovenous graft placement Left 2013    thigh  . 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 Thoracentesis  last done ~ 2 weeks ago  . Thrombectomy and revision of arterioventous (av) goretex  graft Left 01/08/2015    Procedure: THROMBECTOMY AND REVISION OF ARTERIOVENTOUS (AV) THIGH GORETEX  GRAFT;  Surgeon: Chuck Hint, MD;  Location: [facility] OR;  Service: Vascular;  Laterality: Left;   Family History  Problem Relation Age of Onset  . Healthy Mother   . Liver disease Maternal Uncle     drinker  . Asthma Maternal Grandmother    History  Substance Use Topics  . Smoking status: Never Smoker   . Smokeless tobacco: Never Used  . Alcohol Use: No   OB History    No data available     Review of Systems  Respiratory: Positive for cough and shortness of breath.   All other systems reviewed and are negative.     Allergies  Contrast media; Procardia; and Vancomycin  Home Medications   Prior  to Admission medications   Medication Sig Start Date End Date Taking? Authorizing Provider  acetaminophen (TYLENOL) 500 MG tablet Take 500 mg by mouth every 6 (six) hours as needed for moderate pain.   Yes Historical Provider, MD  amLODipine (NORVASC) 5 MG tablet Take 1 tablet (5 mg total) by mouth daily. 01/15/15  Yes Maryann Mikhail, DO  aspirin EC 81 MG tablet Take 1 tablet (81 mg total) by mouth daily. 09/01/14  Yes Richarda Overlie, MD  atorvastatin (LIPITOR) 20 MG tablet Take 1 tablet (20 mg total) by mouth daily. 01/15/15  Yes Maryann Mikhail, DO  calcium acetate (PHOSLO) 667 MG capsule Take 667 mg by mouth 3 (three) times daily with meals.    Yes Historical Provider, MD  cloNIDine (CATAPRES) 0.3 MG tablet Take 0.3 mg by mouth daily.  01/07/15  Yes Historical  Provider, MD  esomeprazole (NEXIUM) 40 MG capsule Take 1 capsule (40 mg total) by mouth daily at 12 noon. 09/06/14  Yes Richarda Overlie, MD  levofloxacin (LEVAQUIN) 500 MG tablet Take it every other day after dialysis for 5 doses 03/22/15  Yes Richardean Canal, MD  Skin Protectants, Misc. (EUCERIN) cream Apply 1 application topically daily as needed for dry skin.    Yes Historical Provider, MD  zolpidem (AMBIEN) 5 MG tablet Take 5 mg by mouth at bedtime as needed for sleep.  12/27/14  Yes Historical Provider, MD  oxyCODONE (OXY IR/ROXICODONE) 5 MG immediate release tablet Take 1-2 tablets (5-10 mg total) by mouth every 4 (four) hours as needed for moderate pain. Patient not taking: Reported on 03/28/2015 01/09/15   Lars Mage, PA-C  traMADol (ULTRAM) 50 MG tablet Take 1 tablet (50 mg total) by mouth every 12 (twelve) hours as needed for severe pain. Patient not taking: Reported on 03/28/2015 12/21/14   Christiane Ha, MD   BP 99/70 mmHg  Pulse 118  Temp(Src) 98.6 F (37 C) (Oral)  Resp 26  Wt 169 lb (76.658 kg)  SpO2 97% Physical Exam  Constitutional: She is oriented to person, place, and time. She appears well-developed and well-nourished. No distress.  HENT:  Head: Normocephalic and atraumatic.  Right Ear: Hearing normal.  Left Ear: Hearing normal.  Nose: Nose normal.  Mouth/Throat: Oropharynx is clear and moist and mucous membranes are normal.  Eyes: Conjunctivae and EOM are normal. Pupils are equal, round, and reactive to light.  Neck: Normal range of motion. Neck supple. JVD present.  Cardiovascular: Regular rhythm, S1 normal and S2 normal.  Exam reveals no gallop and no friction rub.   No murmur heard. Pulmonary/Chest: Effort normal. Tachypnea noted. No respiratory distress. She has rales. She exhibits no tenderness.  Abdominal: Soft. Normal appearance and bowel sounds are normal. There is no hepatosplenomegaly. There is no tenderness. There is no rebound, no guarding, no tenderness at  McBurney's point and negative Murphy's sign. No hernia.  Musculoskeletal: Normal range of motion.  Neurological: She is alert and oriented to person, place, and time. She has normal strength. No cranial nerve deficit or sensory deficit. Coordination normal. GCS eye subscore is 4. GCS verbal subscore is 5. GCS motor subscore is 6.  Skin: Skin is warm, dry and intact. No rash noted. No cyanosis.  Psychiatric: She has a normal mood and affect. Her speech is normal and behavior is normal. Thought content normal.  Nursing note and vitals reviewed.   ED Course  Procedures (including critical care time) Labs Review Labs Reviewed  CBC - Abnormal; Notable for  the following:    WBC 11.8 (*)    RBC 3.52 (*)    Hemoglobin 9.8 (*)    HCT 30.0 (*)    All other components within normal limits  BASIC METABOLIC PANEL - Abnormal; Notable for the following:    Potassium 5.6 (*)    Chloride 95 (*)    CO2 19 (*)    Glucose, Bld 101 (*)    BUN 131 (*)    Creatinine, Ser 16.34 (*)    Calcium 7.3 (*)    GFR calc non Af Amer 2 (*)    GFR calc Af Amer 3 (*)    Anion gap 21 (*)    All other components within normal limits  BRAIN NATRIURETIC PEPTIDE - Abnormal; Notable for the following:    B Natriuretic Peptide 831.2 (*)    All other components within normal limits  I-STAT TROPOININ, ED - Abnormal; Notable for the following:    Troponin i, poc 1.21 (*)    All other components within normal limits  TROPONIN I    Imaging Review Dg Chest 2 View  03/28/2015   CLINICAL DATA:  Increased shortness of breath. End-stage renal disease with dialysis. The patient did not go to dialysis yesterday.  EXAM: CHEST - 2 VIEW  COMPARISON:  Two-view chest 03/22/2015  FINDINGS: The heart is enlarged. Interstitial edema has increased since the prior exam. Small bilateral pleural effusions are noted. Bibasilar airspace disease likely reflects atelectasis.  IMPRESSION: 1. Cardiomegaly and progressive edema and bilateral  effusions compatible with congestive heart failure. 2. Bibasilar spur is disease likely reflects atelectasis.   Electronically Signed   By: Marin Roberts M.D.   On: 03/28/2015 15:12     EKG Interpretation   Date/Time:  Thursday March 28 2015 13:51:24 EDT Ventricular Rate:  99 PR Interval:  96 QRS Duration: 110 QT Interval:  406 QTC Calculation: 521 R Axis:   87 Text Interpretation:  Mobitz I 2-degree AV block (Wenckebach block) Low  voltage QRS RSR' or QR pattern in V1 suggests right ventricular conduction  delay Nonspecific ST abnormality Abnormal ECG Confirmed by POLLINA  MD,  CHRISTOPHER [phone]) on 03/28/2015 3:10:55 PM      MDM   Final diagnoses:  ESRD (end stage renal disease)  Hyperkalemia  Wenckebach second degree AV block  Acute on chronic congestive heart failure, unspecified congestive heart failure type    Patient presents to the emergency department for evaluation of progressively worsening shortness of breath. Patient has a history of congestive heart failure and end-stage renal disease, is on hemodialysis. She missed dialysis yesterday, last dialysis was 3 days ago.  EKG performed at arrival shows Wenckebach second-degree block. Chest x-ray is consistent with congestive heart failure. She does have rales diffusely on examination and JVD to support this diagnosis. She is not in respiratory distress at this time.  With the amount of volume overload that the patient is exhibiting and her elevated potassium, will require emergent dialysis.  Patient does have an elevated troponin. She is not experiencing chest pain currently. This could be troponin leak secondary to her congestive heart failure as well as related to her renal disease. Will need to cycle enzymes to determine if there is a trend. Patient will require hospitalization for further management.    Patient's blood work reveals mild hyperkalemia, potassium is 5.6. Discussed with Dr. Arrie Aran, will  evaluate need for dialysis.  CRITICAL CARE Performed by: Gilda Crease   Total critical care time:  Critical care time was exclusive of separately billable procedures and treating other patients.  Critical care was necessary to treat or prevent imminent or life-threatening deterioration.  Critical care was time spent personally by me on the following activities: development of treatment plan with patient and/or surrogate as well as nursing, discussions with consultants, evaluation of patient's response to treatment, examination of patient, obtaining history from patient or surrogate, ordering and performing treatments and interventions, ordering and review of laboratory studies, ordering and review of radiographic studies, pulse oximetry and re-evaluation of patient's condition.   Gilda Crease, MD 03/28/15 1614  Gilda Crease, MD 03/28/15 [phone]

## 2015-03-28 NOTE — Consult Note (Signed)
CARDIOLOGY CONSULT NOTE   Patient ID: Holly Mendoza MRN: [id number] DOB/AGE: 44-May-1972 44 y.o.  Admit date: 03/28/2015  Primary Physician   Dorrene German, MD Primary Cardiologist   Dr Shirlee Latch saw in [facility] clininc 09/2014 Reason for Consultation   SOB, elevated troponin  Holly Mendoza is a 44 y.o. year old female with a history of ESRD on HD, HTN, recurrent ascites (paracentesis), portal hypertension, PAH, elevated troponin (0.26 in 12/2014) who refused cath in March 2016. At that time, she had been admitted for chest pain after missing HD. R/L heart cath recommended, pt first agreed, then refused. Not seen by cardiology since then.   She has required recurrent paracentesis, has missed HD appts or insisted HD be stopped early due to HA, N&V, rash, she believes to be from Naturalyte dialysate. She has discussed stopping paracentesis and has seen Hospice recently.  She missed HD 06/08. She came to the ER today with increased shortness of breath and chest pain. She is not sure how volume overloaded she is but she is aware that she has increased lower extremity edema and increased abdominal girth as well as increasing shortness of breath. She is currently short of breath at rest.  The chest pain began several days ago. It hurts worse to take a deep breath, she states the pain will increase from a 9/10 to a 10/10 when she takes a deep breath. She has some chest wall tenderness. The pain is diffuse and she states the pain is all the way across her chest. The pain has worsened along with her worsening shortness of breath. There is no diaphoresis or nausea/vomiting. The pain started several days ago and has not been a 0/10 since then.  She states she is just tired of going through all the procedures and tired of all the pain. She states she does not want any additional procedures done and states that she did not request cardiology be called.  Past Medical History  Diagnosis Date  .  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 Hypertension   . ESRD on hemodialysis 04/20/2014    Patient started HD in 1998.  She has been dialyzed in [place] at "[facility]" until moving to [place] in July 2015 to live with family.  She has had failed accesses in the L arm.  No attempt to place access was made in the R arm, patient is not sure why.  She has a L thigh AVG which she says has been functional for 7-8 years.  They stopped giving her heparin several years ago due to prolonged access bleeding.    Marland Kitchen Heart murmur   . Anemia of chronic disease   . History of blood transfusion     "several; w/transplant, infections, etc"  . Headache(784.0)     "often; sometimes daily" (04/26/2014)  . Migraines     "qod" (04/26/2014)  . ESRD on hemodialysis 04/20/2014    Patient started HD in 1998.  She has been dialyzed in [place] at "[facility]" until moving to [place] in July 2015 to live with family.  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  She has had failed accesses in the L arm.  No attempt to place access was made in the R arm, patient is not sure why.  She has a L thigh AVG which she says has been functional for  . Ascites   .  Pulmonary hypertension   . Ascites   . Cirrhosis      Past Surgical History  Procedure Laterality Date  . Arteriovenous graft placement Left 2013    thigh  . 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 Thoracentesis  last done ~ 2 weeks ago  . Thrombectomy and revision of arterioventous (av) goretex  graft Left 01/08/2015    Procedure: THROMBECTOMY AND REVISION OF ARTERIOVENTOUS (AV) THIGH GORETEX  GRAFT;  Surgeon: Chuck Hint, MD;  Location: [facility];  Service: Vascular;  Laterality: Left;    Allergies  Allergen Reactions  . Contrast Media [Iodinated Diagnostic Agents] Anaphylaxis  . Procardia [Nifedipine] Other (See Comments)    Sweating,  BP drops dangerously low,  . Vancomycin Anaphylaxis and Hives   I have reviewed the patient's current medications . heparin subcutaneous  5,000 Units Subcutaneous 3 times per day  . senna-docusate  2 tablet Oral BID    Medication Sig  acetaminophen (TYLENOL) 500 MG tablet Take 500 mg by mouth every 6 (six) hours as needed for moderate pain.  amLODipine (NORVASC) 5 MG tablet Take 1 tablet (5 mg total) by mouth daily.  aspirin EC 81 MG tablet Take 1 tablet (81 mg total) by mouth daily.  atorvastatin (LIPITOR) 20 MG tablet Take 1 tablet (20 mg total) by mouth daily.  calcium acetate (PHOSLO) 667 MG capsule Take 667 mg by mouth 3 (three) times daily with meals.   cloNIDine (CATAPRES) 0.3 MG tablet Take 0.3 mg by mouth daily.   esomeprazole (NEXIUM) 40 MG capsule Take 1 capsule (40 mg total) by mouth daily at 12 noon.  levofloxacin (LEVAQUIN) 500 MG tablet Take it every other day after dialysis for 5 doses  Skin Protectants, Misc. (EUCERIN) cream Apply 1 application topically daily as needed for dry skin.   zolpidem (AMBIEN) 5 MG tablet Take 5 mg by mouth at bedtime as needed for sleep.   oxyCODONE (OXY IR/ROXICODONE) 5 MG immediate release tablet Take 1-2 tablets (5-10 mg total) by mouth every 4 (four) hours as needed for moderate pain. Patient not taking: Reported on 03/28/2015  traMADol (ULTRAM) 50 MG tablet Take 1 tablet (50 mg total) by mouth every 12 (twelve) hours as needed for severe pain. Patient not taking: Reported on 03/28/2015     History   Social History  . Marital Status: Single    Spouse Name: N/A  . Number of Children: 0  . Years of Education: N/A   Occupational History  . Umemployed    Social History Main Topics  . Smoking status: Never Smoker   . Smokeless tobacco: Never Used  . Alcohol Use: No  . Drug Use: No  . Sexual Activity: No   Other Topics Concern  . Not on file   Social History Narrative   Lives in [place] with [place]. Moved from [place] in July  2015.     Family Status  Relation Status Death Age  . Maternal Grandfather Deceased   . Maternal Grandmother Deceased    Family History  Problem Relation Age of Onset  . Healthy Mother   . Liver disease Maternal Uncle     drinker  . Asthma Maternal Grandmother      ROS:  Full 14 point review of systems complete and found to be negative unless listed above.  Physical Exam: Blood pressure 99/67, pulse 57, temperature 97.7 F (36.5 C), temperature source Oral, resp. rate 10,  weight 169 lb (76.658 kg), SpO2 100 %.  General: Well developed, well nourished, female, short of breath at rest Head: Eyes PERRLA, No xanthomas.   Normocephalic and atraumatic, oropharynx without edema or exudate. Dentition: Good Lungs: Bibasilar crackles, chest wall tenderness noted Heart: HRRR S1 S2, possible gallop, possible murmur versus rub. pulses are 1+ in both lower extrem. Decreased in both upper extremities, left upper extremity has upper arm previous HD graft  Neck: No carotid bruits. No lymphadenopathy.  JVD to jaw. Abdomen: Bowel sounds present, abdomen firm and tender, greatly enlarged secondary to ascites. Msk:  No spine or cva tenderness. No weakness, no joint deformities or effusions. Extremities: No clubbing or cyanosis. 2+ edema. Dialysis access is in left thigh Neuro: Alert and oriented X 3. No focal deficits noted. Psych:  Good affect, responds appropriately Skin: No rashes or lesions noted.  Labs:   Lab Results  Component Value Date   WBC 11.8* 03/28/2015   HGB 9.8* 03/28/2015   HCT 30.0* 03/28/2015   MCV 85.2 03/28/2015   PLT 242 03/28/2015    Recent Labs Lab 03/22/15 1630 03/28/15 1450  NA 134* 135  K 6.1* 5.6*  CL 96* 95*  CO2 17* 19*  BUN 151* 131*  CREATININE 17.08* 16.34*  CALCIUM 7.9* 7.3*  GLUCOSE 103* 101*  ALBUMIN 1.7*  --     Recent Labs  03/28/15 1450  TROPONINI 1.13*    Recent Labs  03/28/15 1455  TROPIPOC 1.21*   B NATRIURETIC PEPTIDE    Date/Time Value Ref Range Status  03/28/2015 02:50 PM 831.2* 0.0 - 100.0 pg/mL Final  03/22/2015 12:10 PM 1475.4* 0.0 - 100.0 pg/mL Final    Echo: 01/14/2015 - Left ventricle: The cavity size was normal. Wall thickness was increased in a pattern of mild LVH. Systolic function was vigorous. The estimated ejection fraction was in the range of 65% to 70%. Wall motion was normal; there were no regional wall motion abnormalities. The study is not technically sufficient to allow evaluation of LV diastolic function. - Ventricular septum: The contour showed diastolic flattening and systolic flattening. - Aortic valve: There was trivial regurgitation. - Mitral valve: Calcified annulus. Mildly thickened leaflets . There was mild regurgitation. - Left atrium: The atrium was mildly dilated. - Right ventricle: The cavity size was mildly dilated. - Right atrium: The atrium was moderately dilated. - Tricuspid valve: There was severe regurgitation. - Pulmonary arteries: Systolic pressure was moderately increased. PA peak pressure: 50 mm Hg (S). - Pericardium, extracardiac: A trivial pericardial effusion was identified. Impressions: - Vigorous LV function; mild LVH; mild LAE; calcified aortic valve; elevated mean gradient of 19 mmHg suggests mild to moderate AS but visually valve opens well; some of gradient likely related to vigorous LV function; mild MR; RAE/RVE; severe TR; moderately elevated pulmonary pressure.  ECG: Wenkebach  Radiology:  Dg Chest 2 View  03/28/2015   CLINICAL DATA:  Increased shortness of breath. End-stage renal disease with dialysis. The patient did not go to dialysis yesterday.  EXAM: CHEST - 2 VIEW  COMPARISON:  Two-view chest 03/22/2015  FINDINGS: The heart is enlarged. Interstitial edema has increased since the prior exam. Small bilateral pleural effusions are noted. Bibasilar airspace disease likely reflects atelectasis.  IMPRESSION: 1.  Cardiomegaly and progressive edema and bilateral effusions compatible with congestive heart failure. 2. Bibasilar spur is disease likely reflects atelectasis.   Electronically Signed   By: Marin Roberts M.D.   On: 03/28/2015 15:12    ASSESSMENT AND PLAN:  The patient was seen today by Dr Rennis Golden, the patient evaluated and the data reviewed.  Principal Problem:   Acute on chronic diastolic CHF (congestive heart failure), NYHA class 4 - Volume management with dialysis, will leave to Nephrology  Active Problems:   Chest pain with high risk for cardiac etiology - NSTEMI by enzymes - discuss heparin with MD, previous problems with increased bleeding - cath indicated, The risks and benefits of a cardiac catheterization including, but not limited to, death, stroke, MI, kidney damage and bleeding were discussed with the patient who indicates understanding and agrees to proceed.  - will pretreat for dye allergy - ck echo    ESRD (end stage renal disease) on dialysis - per Nephrology    Hyperkalemia - manage w/ HD, per Nephrology    Troponin level elevated - see above    Ascites - last paracentesis was about 2 weeks ago - mgt per IM  SignedTheodore Demark, PA-C 03/28/2015 7:00 PM Beeper [phone]  Co-Sign MD

## 2015-03-28 NOTE — ED Notes (Signed)
Report given to [facility] RN 6E, per MD pt to go to dialysis before. Dialysis aware, oncoming RN aware.

## 2015-03-29 ENCOUNTER — Encounter (HOSPITAL_COMMUNITY)
Admission: EM | Disposition: A | Discharge status: Home / Self Care | Payer: Self-pay | Source: Home / Self Care | Attending: Internal Medicine

## 2015-03-29 ENCOUNTER — Inpatient Hospital Stay (HOSPITAL_COMMUNITY): Payer: Medicare Other

## 2015-03-29 DIAGNOSIS — R079 Chest pain, unspecified: Secondary | ICD-10-CM

## 2015-03-29 DIAGNOSIS — Z515 Encounter for palliative care: Secondary | ICD-10-CM | POA: Diagnosis present

## 2015-03-29 LAB — CBC
HCT: 30 % — ABNORMAL LOW (ref 36.0–46.0)
HCT: 32.9 % — ABNORMAL LOW (ref 36.0–46.0)
HEMOGLOBIN: 10.1 g/dL — AB (ref 12.0–15.0)
Hemoglobin: 10.6 g/dL — ABNORMAL LOW (ref 12.0–15.0)
MCH: 28.1 pg (ref 26.0–34.0)
MCH: 27.2 pg (ref 26.0–34.0)
MCHC: 33.7 g/dL (ref 30.0–36.0)
MCHC: 32.2 g/dL (ref 30.0–36.0)
MCV: 83.3 fL (ref 78.0–100.0)
MCV: 84.6 fL (ref 78.0–100.0)
PLATELETS: 258 10*3/uL (ref 150–400)
Platelets: 246 10*3/uL (ref 150–400)
RBC: 3.6 MIL/uL — AB (ref 3.87–5.11)
RBC: 3.89 MIL/uL (ref 3.87–5.11)
RDW: 14.7 % (ref 11.5–15.5)
RDW: 14.9 % (ref 11.5–15.5)
WBC: 12.2 10*3/uL — ABNORMAL HIGH (ref 4.0–10.5)
WBC: 11.9 10*3/uL — AB (ref 4.0–10.5)

## 2015-03-29 LAB — TROPONIN I
TROPONIN I: 0.74 ng/mL — AB (ref <0.031)
Troponin I: 0.73 ng/mL (ref <0.031)
Troponin I: 0.65 ng/mL (ref <0.031)

## 2015-03-29 LAB — PROTIME-INR
INR: 1.26 (ref 0.00–1.49)
Prothrombin Time: 16 seconds — ABNORMAL HIGH (ref 11.6–15.2)

## 2015-03-29 LAB — BASIC METABOLIC PANEL
Anion gap: 20 — ABNORMAL HIGH (ref 5–15)
BUN: 90 mg/dL — ABNORMAL HIGH (ref 6–20)
CALCIUM: 7.7 mg/dL — AB (ref 8.9–10.3)
CHLORIDE: 93 mmol/L — AB (ref 101–111)
CO2: 24 mmol/L (ref 22–32)
Creatinine, Ser: 12.55 mg/dL — ABNORMAL HIGH (ref 0.44–1.00)
GFR calc non Af Amer: 3 mL/min — ABNORMAL LOW (ref >60)
GFR, EST AFRICAN AMERICAN: 4 mL/min — AB (ref >60)
Glucose, Bld: 99 mg/dL (ref 65–99)
Potassium: 5.1 mmol/L (ref 3.5–5.1)
Sodium: 137 mmol/L (ref 135–145)

## 2015-03-29 LAB — CREATININE, SERUM
Creatinine, Ser: 16.42 mg/dL — ABNORMAL HIGH (ref 0.44–1.00)
GFR calc Af Amer: 3 mL/min — ABNORMAL LOW (ref >60)
GFR, EST NON AFRICAN AMERICAN: 2 mL/min — AB (ref >60)

## 2015-03-29 LAB — MRSA PCR SCREENING: MRSA BY PCR: NEGATIVE

## 2015-03-29 SURGERY — LEFT HEART CATH AND CORONARY ANGIOGRAPHY
Anesthesia: LOCAL

## 2015-03-29 MED ORDER — SODIUM CHLORIDE 0.9 % IJ SOLN: 3.0000 mL | INTRAMUSCULAR | Status: DC | PRN

## 2015-03-29 MED ORDER — SODIUM CHLORIDE 0.9 % IV SOLN: 100.0000 mL | INTRAVENOUS | Status: DC | PRN

## 2015-03-29 MED ORDER — ASPIRIN EC 81 MG PO TBEC
81.0000 mg | DELAYED_RELEASE_TABLET | Freq: Every day | ORAL | Status: DC
  Administered 2015-03-29 – 2015-04-01 (×4): 81 mg via ORAL
  Filled 2015-03-29 (×4): qty 1

## 2015-03-29 MED ORDER — HEPARIN SODIUM (PORCINE) 1000 UNIT/ML DIALYSIS: 1000.0000 [IU] | INTRAMUSCULAR | Status: DC | PRN

## 2015-03-29 MED ORDER — SODIUM CHLORIDE 0.9 % IJ SOLN
3.0000 mL | Freq: Two times a day (BID) | INTRAMUSCULAR | Status: DC
  Administered 2015-03-29: 3 mL via INTRAVENOUS

## 2015-03-29 MED ORDER — SODIUM CHLORIDE 0.9 % IV SOLN: 250.0000 mL | INTRAVENOUS | Status: DC | PRN

## 2015-03-29 MED ORDER — ZOLPIDEM TARTRATE 5 MG PO TABS
5.0000 mg | ORAL_TABLET | Freq: Every evening | ORAL | Status: DC | PRN
  Administered 2015-03-30 – 2015-04-06 (×3): 5 mg via ORAL
  Filled 2015-03-29 (×3): qty 1

## 2015-03-29 MED ORDER — HEPARIN SODIUM (PORCINE) 5000 UNIT/ML IJ SOLN: 5000.0000 [IU] | Freq: Three times a day (TID) | INTRAMUSCULAR | Status: DC

## 2015-03-29 MED ORDER — FAMOTIDINE IN NACL 20-0.9 MG/50ML-% IV SOLN
20.0000 mg | INTRAVENOUS | Status: DC
  Filled 2015-03-29: qty 50

## 2015-03-29 MED ORDER — PREDNISONE 50 MG PO TABS
60.0000 mg | ORAL_TABLET | ORAL | Status: DC
  Filled 2015-03-29: qty 1

## 2015-03-29 MED ORDER — ALTEPLASE 2 MG IJ SOLR
2.0000 mg | Freq: Once | INTRAMUSCULAR | Status: AC | PRN
  Filled 2015-03-29: qty 2

## 2015-03-29 MED ORDER — LIDOCAINE HCL (PF) 1 % IJ SOLN: 5.0000 mL | INTRAMUSCULAR | Status: DC | PRN

## 2015-03-29 MED ORDER — FENTANYL CITRATE (PF) 100 MCG/2ML IJ SOLN
25.0000 ug | INTRAMUSCULAR | Status: DC | PRN
  Administered 2015-03-29 – 2015-03-31 (×13): 25 ug via INTRAVENOUS
  Filled 2015-03-29 (×12): qty 2

## 2015-03-29 MED ORDER — PENTAFLUOROPROP-TETRAFLUOROETH EX AERO: 1.0000 "application " | INHALATION_SPRAY | CUTANEOUS | Status: DC | PRN

## 2015-03-29 MED ORDER — ASPIRIN 81 MG PO CHEW: 81.0000 mg | CHEWABLE_TABLET | ORAL | Status: DC

## 2015-03-29 MED ORDER — NEPRO/CARBSTEADY PO LIQD: 237.0000 mL | ORAL | Status: DC | PRN

## 2015-03-29 MED ORDER — LIDOCAINE-PRILOCAINE 2.5-2.5 % EX CREA: 1.0000 "application " | TOPICAL_CREAM | CUTANEOUS | Status: DC | PRN

## 2015-03-29 MED ORDER — CALCIUM ACETATE (PHOS BINDER) 667 MG PO CAPS
667.0000 mg | ORAL_CAPSULE | Freq: Three times a day (TID) | ORAL | Status: DC
  Administered 2015-03-29 – 2015-04-05 (×14): 667 mg via ORAL
  Filled 2015-03-29 (×29): qty 1

## 2015-03-29 MED ORDER — SODIUM CHLORIDE 0.9 % IV SOLN: INTRAVENOUS | Status: DC

## 2015-03-29 MED ORDER — ALTEPLASE 2 MG IJ SOLR: 2.0000 mg | Freq: Once | INTRAMUSCULAR | Status: DC | PRN

## 2015-03-29 NOTE — Progress Notes (Signed)
Chaplain received page at 4:03pm that pt wanted to complete advanced directive.   Chaplain responded within 10 min and pt was sleeping. Chaplain peeped in room and spoke to pt, pt woke up and requested it be done another day.   Chaplain informed pt that it can be done Monday. Pt agreed and went back to sleep.   Gala Romney, Chaplain 03/29/2015

## 2015-03-29 NOTE — Progress Notes (Signed)
Pt arrived to the unit on room air, on monitor, VSS. 0.5 x 0.5 wound draining pink yellow drainage on left upper thigh AVF. Belongings at the bedside and medications at the bedside sent to pharmacy.  Clonidine 0.3mg  (29 ct) Levofloxacin 80mg  (3ct) Calcium acetate 667mg  (3 ct) Zolpidem 5mg  (18 ct) Nexium 40mg  (15ct)  Pt. Alert and oriented x 4. Pt oriented to unit and call bell placed within reach. Will continue to monitor pt.

## 2015-03-29 NOTE — Progress Notes (Signed)
Subjective: Interval History: has no complaint , doing all right.  Objective: Vital signs in last 24 hours: Temp:  [97.4 F (36.3 C)-98.8 F (37.1 C)] 98.8 F (37.1 C) (06/10 0800) Pulse Rate:  [39-120] 106 (06/10 0801) Resp:  [10-30] 21 (06/10 0801) BP: (92-127)/(59-94) 105/74 mmHg (06/10 0800) SpO2:  [83 %-100 %] 91 % (06/10 0801) Weight:  [76.658 kg (169 lb)-83.2 kg (183 lb 6.8 oz)] 82.4 kg (181 lb 10.5 oz) (06/10 0312) Weight change:   Intake/Output from previous day: 06/09 0701 - 06/10 0700 In: -  Out: 1486 [Stool:1] Intake/Output this shift:    General appearance: cooperative, toxic and withdrawn, sallow complex, dry skin Resp: rales bibasilar and R>L Cardio: S1, S2 normal and systolic murmur: holosystolic 3/6, blowing at apex GI: mod distension,liver down 6 cm Extremities: edema 2+. AVF and dry skin  Lab Results:  Recent Labs  03/29/15 0050 03/29/15 0541  WBC 12.2* 11.9*  HGB 10.1* 10.6*  HCT 30.0* 32.9*  PLT 258 246   BMET:  Recent Labs  03/28/15 1450 03/29/15 0050 03/29/15 0541  NA 135  --  137  K 5.6*  --  5.1  CL 95*  --  93*  CO2 19*  --  24  GLUCOSE 101*  --  99  BUN 131*  --  90*  CREATININE 16.34* 16.42* 12.55*  CALCIUM 7.3*  --  7.7*   No results for input(s): PTH in the last 72 hours. Iron Studies: No results for input(s): IRON, TIBC, TRANSFERRIN, FERRITIN in the last 72 hours.  Studies/Results: Dg Chest 2 View  03/28/2015   CLINICAL DATA:  Increased shortness of breath. End-stage renal disease with dialysis. The patient did not go to dialysis yesterday.  EXAM: CHEST - 2 VIEW  COMPARISON:  Two-view chest 03/22/2015  FINDINGS: The heart is enlarged. Interstitial edema has increased since the prior exam. Small bilateral pleural effusions are noted. Bibasilar airspace disease likely reflects atelectasis.  IMPRESSION: 1. Cardiomegaly and progressive edema and bilateral effusions compatible with congestive heart failure. 2. Bibasilar spur is  disease likely reflects atelectasis.   Electronically Signed   By: Marin Roberts M.D.   On: 03/28/2015 15:12    I have reviewed the patient's current medications.  Assessment/Plan: 1 ESRD Uremic, vol xs, NS and S/O 2 NONADHERENCE primary issue.  Suspect CP is uremic pleuritis/pericarditis 3 CP as above. No small risk for CAD 4 Anemia 5 Ascites from inadequate dialysis 6 HTN vol xs P HD in am.  NEEDS HOSPICE as is KILLING HERSELF,      LOS: 1 day   Graceann Boileau L 03/29/2015,9:56 AM

## 2015-03-29 NOTE — Progress Notes (Signed)
Pt only ran 2hr 11 min of her ordered 4hr Tx. Frequently became anxious and would ask to get NS of be taken off. States she feels like her vessels in her neck and chest are cramping and hurting. Continually praying during her Tx. Very anxious. State she thinks she is allergic to the bath and is not able to tolerate it. Began crying and states to take her off that she can not take it anymore. Explained to pt that this is against medical advice and she did not care. Taken of 1hr 48 min early. Pt much more relaxed after Tx stopped and went to sleep.

## 2015-03-29 NOTE — Progress Notes (Signed)
  Echocardiogram 2D Echocardiogram has been performed.  Arvil Chaco 03/29/2015, 2:43 PM

## 2015-03-29 NOTE — Progress Notes (Signed)
    Subjective:  Denies dyspnea; chest pain improving   Objective:  Filed Vitals:   03/29/15 0330 03/29/15 0400 03/29/15 0800 03/29/15 0801  BP: 110/69 127/82 105/74   Pulse: 107   106  Temp:   98.8 F (37.1 C)   TempSrc:   Oral   Resp: 23 23 24 21   Height:      Weight:      SpO2:    91%    Intake/Output from previous day:  Intake/Output Summary (Last 24 hours) at 03/29/15 1011 Last data filed at 03/29/15 0444  Gross per 24 hour  Intake      0 ml  Output   1486 ml  Net  -1486 ml    Physical Exam: Physical exam: Well-developed chronically ill appearing in no acute distress.  Skin is warm and dry.  HEENT is normal.  Neck is supple.  Chest with mild basilar crackles Cardiovascular exam is tachycardic and irregular, loud rub Abdominal exam mild tenderness, distended, positive fluid wave Extremities show 1+ edema. neuro grossly intact    Lab Results: Basic Metabolic Panel:  Recent Labs  97/91/50 1450 03/29/15 0050 03/29/15 0541  NA 135  --  137  K 5.6*  --  5.1  CL 95*  --  93*  CO2 19*  --  24  GLUCOSE 101*  --  99  BUN 131*  --  90*  CREATININE 16.34* 16.42* 12.55*  CALCIUM 7.3*  --  7.7*   CBC:  Recent Labs  03/29/15 0050 03/29/15 0541  WBC 12.2* 11.9*  HGB 10.1* 10.6*  HCT 30.0* 32.9*  MCV 83.3 84.6  PLT 258 246   Cardiac Enzymes:  Recent Labs  03/28/15 1450 03/29/15 0050 03/29/15 0541  TROPONINI 1.13* 0.74* 0.73*     Assessment/Plan:  1 non-ST elevation myocardial infarction-elevated troponin secondary to ischemia versus uremic pericarditis. Right and left cardiac catheterization had been scheduled for today but patient now refuses. She states she will consider later. Continue aspirin and subcutaneous heparin. Echocardiogram to assess LV function and rule out pericardial effusion from uremia. 2 end-stage renal disease management per nephrology. 3 noncompliance-this appears to be the predominant issue. She apparently is considering  hospice. 4 tachycardia-electrocardiograms are reviewed. Rhythm not completely clear. Question sinus tachycardia with nonconducted PACs and occasional junctional beats. Patient does not appear to be symptomatic from this. 5 acute on chronic diastolic congestive heart failure-volume excess most likely related to a combination of noncompliance with dialysis and diastolic congestive heart failure. Volume management per nephrology. 6 pulmonary hypertension. Very difficult situation.  Holly Mendoza 03/29/2015, 10:11 AM

## 2015-03-29 NOTE — Care Management Note (Signed)
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  Action/Plan:  CM to f/u with d/c needs Expected Discharge Date:                  Expected Discharge Plan:  Home w Home Health Services  In-House Referral:     Discharge planning Services  CM Consult  Post Acute Care Choice:    Choice offered to:     DME Arranged:    DME Agency:     HH Arranged:    HH Agency:     Status of Service:  In process, will continue to follow  Medicare Important Message Given:    Date Medicare IM Given:    Medicare IM give by:    Date Additional Medicare IM Given:    Additional Medicare Important Message give by:     If discussed at Long Length of Stay Meetings, dates discussed:    Additional Comments:  Epifanio Lesches, RN 03/29/2015, 4:06 PM

## 2015-03-29 NOTE — Consult Note (Signed)
Consultation Note Date: 03/29/2015   Patient Name: Holly Mendoza  DOB: April 21, 1971  MRN: [id number]  Age / Sex: 44 y.o., female   PCP: Fleet Contras, MD Referring Physician: Lonia Blood, MD  Reason for Consultation: Establishing goals of care  Palliative Care Assessment and Plan Summary of Established Goals of Care and Medical Treatment Preferences   Clinical Assessment/Narrative: Had a lengthy conversation with Ms. Ausmus regarding goals of care as it relates to continuing HD as well as her worsening cardiac status, recommendation to pursue cardiac cath as well as hospice. The crux of all health care decisions seems to be tied to the naturolyte dialysis bath. She states that it is casing her to have ascites as well as muscle wasting. That it causes her to burn, itch and feels it is harming her. When I asked her about the recommended cardiac cath, that decision too circled back to the naturolye in terms of what is  the point since this is causing her to not adequately treat her ESRD. I did talk about hospice as well. This afternoon she is not describing wanting to stop HD because of fatigue with medical interventions but that if she cannot be offered something other than naturolyte there is no point in anything else. I asked her if she was ready to die, she stated she "wanted to live". She was able to articulate things she found joy and meaning in on HD.Her question is is naturolyte the only option because that is all this area has to offer in terms of dialysis or is this selection all that SHE as a pt can have because of her condition. She states she will go to dialysis tomorrow. She also states if it becomes necessary to save her life she would have cardiac cath as well. She has had a cardiac cath in the past in [place] and states "nothing was any different". I asked her what she meant by that and again feeling is "what is the point if they are going to give me naturolyte" which she views  as giving her all these problems. No overt delusions elicited in terms of people trying to harm her, poison her. It would be interesting to see if her compliance improved with a different dialysate. In the interim in terms of her cardiac dysfunction I directly asked if her heart were to stop would she want CPR, defibrillation, she said yes. She said yes to intubation if necessary to protect her airway. She tells me that she will go ahead with diaylsis tomorrow, recognizing that we cannot find another resource, if one is out there,  on Friday afternoon. Addressed cardiac cath again and she states that she would rather wait until Monday, as she needs to pray on it. I asked her what if her body dictates otherwise would she go forward and she said yes. Per medical record review she was scheduled for a cath in March 2016 and a similar thing happened at the last minute in terms of refusing at the last moniute  Contacts/Participants in Discussion: Primary Decision Maker: pt  HCPOA: no but would like assistance with making her sister HCPOA. Pt's mother is also alive. She is unmarried with no children  Above discussion held with pt and co-worker Lillia Carmel, NP  Code Status/Advance Care Planning:  Continue full code  Symptom Management:   Pain: Pt reporting pain in dialysis but feels the only treatment is to stop the naturolyte vs. Pain control  Additional Recommendations (Limitations, Scope, Preferences):  Would recommend that an alternative whether it is local or not for another dialysate be offered.  Psycho-social/Spiritual:   Support System: Limited  Desire for further Chaplaincy support: yes  Prognosis: Unable to determine. Depends on whether pt pursues HD and cardiac cath as recommended  Discharge Planning:  TBD. She is not pursing interest in hospice according to discussion today from fatigue with HD but the lack of different dialysate and options. Reports "wanting to live" and that she could  live on HD if she had a different dialysate       Chief Complaint/History of Present Illness: Pt is a 44 yo female admitted with c/o chest pain. She is an ESRD pt on HD for which she has been non-compliant secondary to her perception of intolerance to dialysate bath, naturolyte . She reports that she doesn't feel well, but is alert, talking and preparing to eat lunch  Primary Diagnoses  Present on Admission:  . Chest pain with high risk for cardiac etiology . Hyperkalemia . Troponin level elevated . Acute on chronic diastolic CHF (congestive heart failure), NYHA class 4 . Ascites . ESRD (end stage renal disease)  Palliative Review of Systems: Currently denying pain or dyspnea or n/v. States she doesn't feel well but cannot articulate how I have reviewed the medical record, interviewed the patient and family, and examined the patient. The following aspects are pertinent.  Past Medical History  Diagnosis Date  . 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 Hypertension   . ESRD on hemodialysis 04/20/2014    Patient started HD in 1998.  She has been dialyzed in [place] at "[facility]" until moving to [place] in July 2015 to live with family.  She has had failed accesses in the L arm.  No attempt to place access was made in the R arm, patient is not sure why.  She has a L thigh AVG which she says has been functional for 7-8 years.  They stopped giving her heparin several years ago due to prolonged access bleeding.    Marland Kitchen Heart murmur   . Anemia of chronic disease   . History of blood transfusion     "several; w/transplant, infections, etc"  . Headache(784.0)     "often; sometimes daily" (04/26/2014)  . Migraines     "qod" (04/26/2014)  . ESRD on hemodialysis 04/20/2014    Patient started HD in 1998.  She has been dialyzed in [place] at "[facility]" until moving to [place] in July 2015 to live with family.  She had a transplant in 2012 which didn't  take and it was removed in 2013.  She has had failed accesses in the L arm.  No attempt to place access was made in the R arm, patient is not sure why.  She has a L thigh AVG which she says has been functional for  . Ascites   . Pulmonary hypertension   . Ascites   . Cirrhosis    History   Social History  . Marital Status: Single    Spouse Name: N/A  . Number of Children: 0  . Years of Education: N/A   Occupational History  . Umemployed    Social History Main Topics  . Smoking status: Never Smoker   . Smokeless tobacco: Never Used  . Alcohol Use: No  . Drug Use: No  . Sexual Activity: No   Other Topics Concern  . None   Social History Narrative  Lives in [place] with Aunt. Moved from [place] in July 2015.    Family History  Problem Relation Age of Onset  . Healthy Mother   . Liver disease Maternal Uncle     drinker  . Asthma Maternal Grandmother    Scheduled Meds: . aspirin  81 mg Oral Pre-Cath  . aspirin EC  81 mg Oral Daily  . calcium acetate  667 mg Oral TID WC  . famotidine (PEPCID) IV  20 mg Intravenous Pre-Cath  . heparin subcutaneous  5,000 Units Subcutaneous 3 times per day  . predniSONE  60 mg Oral Pre-Cath  . senna-docusate  2 tablet Oral BID  . sodium chloride  3 mL Intravenous Q12H   Continuous Infusions: . sodium chloride     PRN Meds:.sodium chloride, sodium chloride, sodium chloride, alteplase, feeding supplement (NEPRO CARB STEADY), fentaNYL (SUBLIMAZE) injection, heparin, lidocaine (PF), lidocaine-prilocaine, pentafluoroprop-tetrafluoroeth, sodium chloride, zolpidem Medications Prior to Admission:  Prior to Admission medications   Medication Sig Start Date End Date Taking? Authorizing Provider  acetaminophen (TYLENOL) 500 MG tablet Take 500 mg by mouth every 6 (six) hours as needed for moderate pain.   Yes Historical Provider, MD  amLODipine (NORVASC) 5 MG tablet Take 1 tablet (5 mg total) by mouth daily. 01/15/15  Yes Maryann Mikhail,  DO  aspirin EC 81 MG tablet Take 1 tablet (81 mg total) by mouth daily. 09/01/14  Yes Richarda Overlie, MD  atorvastatin (LIPITOR) 20 MG tablet Take 1 tablet (20 mg total) by mouth daily. 01/15/15  Yes Maryann Mikhail, DO  calcium acetate (PHOSLO) 667 MG capsule Take 667 mg by mouth 3 (three) times daily with meals.    Yes Historical Provider, MD  cloNIDine (CATAPRES) 0.3 MG tablet Take 0.3 mg by mouth daily.  01/07/15  Yes Historical Provider, MD  esomeprazole (NEXIUM) 40 MG capsule Take 1 capsule (40 mg total) by mouth daily at 12 noon. 09/06/14  Yes Richarda Overlie, MD  levofloxacin (LEVAQUIN) 500 MG tablet Take it every other day after dialysis for 5 doses 03/22/15  Yes Richardean Canal, MD  Skin Protectants, Misc. (EUCERIN) cream Apply 1 application topically daily as needed for dry skin.    Yes Historical Provider, MD  zolpidem (AMBIEN) 5 MG tablet Take 5 mg by mouth at bedtime as needed for sleep.  12/27/14  Yes Historical Provider, MD  oxyCODONE (OXY IR/ROXICODONE) 5 MG immediate release tablet Take 1-2 tablets (5-10 mg total) by mouth every 4 (four) hours as needed for moderate pain. Patient not taking: Reported on 03/28/2015 01/09/15   Lars Mage, PA-C  traMADol (ULTRAM) 50 MG tablet Take 1 tablet (50 mg total) by mouth every 12 (twelve) hours as needed for severe pain. Patient not taking: Reported on 03/28/2015 12/21/14   Christiane Ha, MD   Allergies  Allergen Reactions  . Contrast Media [Iodinated Diagnostic Agents] Anaphylaxis  . Procardia [Nifedipine] Other (See Comments)    Sweating, BP drops dangerously low,  . Vancomycin Anaphylaxis and Hives   CBC:    Component Value Date/Time   WBC 11.9* 03/29/2015 0541   HGB 10.6* 03/29/2015 0541   HCT 32.9* 03/29/2015 0541   PLT 246 03/29/2015 0541   MCV 84.6 03/29/2015 0541   NEUTROABS 13.0* 01/13/2015 0150   LYMPHSABS 1.1 01/13/2015 0150   MONOABS 1.2* 01/13/2015 0150   EOSABS 0.2 01/13/2015 0150   BASOSABS 0.0 01/13/2015 0150    Comprehensive Metabolic Panel:    Component Value Date/Time   NA 137 03/29/2015  0541   K 5.1 03/29/2015 0541   CL 93* 03/29/2015 0541   CO2 24 03/29/2015 0541   BUN 90* 03/29/2015 0541   CREATININE 12.55* 03/29/2015 0541   GLUCOSE 99 03/29/2015 0541   CALCIUM 7.7* 03/29/2015 0541   AST 15 01/13/2015 1145   ALT 15 01/13/2015 1145   ALKPHOS 143* 01/13/2015 1145   BILITOT 0.6 01/13/2015 1145   PROT 5.5* 01/13/2015 1145   ALBUMIN 1.7* 03/22/2015 1630    Physical Exam: Vital Signs: BP 98/57 mmHg  Pulse 31  Temp(Src) 98.7 F (37.1 C) (Oral)  Resp 23  Ht 5' 9.5" (1.765 m)  Wt 82.4 kg (181 lb 10.5 oz)  BMI 26.45 kg/m2  SpO2 98% SpO2: SpO2: 98 % O2 Device: O2 Device: Not Delivered O2 Flow Rate:   Intake/output summary:  Intake/Output Summary (Last 24 hours) at 03/29/15 1354 Last data filed at 03/29/15 0444  Gross per 24 hour  Intake      0 ml  Output   1486 ml  Net  -1486 ml   LBM:   Baseline Weight: Weight: 76.658 kg (169 lb) Most recent weight: Weight: 82.4 kg (181 lb 10.5 oz)  Exam Findings:  General: Alert middle aged female. Periorbital edema Resp: No tachypnea Musculoskeletal: MAE x 4  Psych: a/o x 4. No delusions expressed. Thought processes organized         Palliative Performance Scale 60 %              Additional Data Reviewed: Recent Labs     03/28/15  1450  03/29/15  0050  03/29/15  0541  WBC  11.8*  12.2*  11.9*  HGB  9.8*  10.1*  10.6*  PLT  242  258  246  NA  135   --   137  BUN  131*   --   90*  CREATININE  16.34*  16.42*  12.55*     Time In: 1130 Time Out:1330 Time Total: 120 minutes Greater than 50%  of this time was spent counseling and coordinating care related to the above assessment and plan. Discussed case with Dr. Sharon Seller, and Bard Herbert, PA-C with nephrology as well as unit nurse  Signed by: Irean Hong, NP  Irean Hong, NP  03/29/2015, 1:54 PM  Please contact Palliative Medicine Team phone at [phone]  for questions and concerns.

## 2015-03-29 NOTE — Progress Notes (Addendum)
Pt stated moved from [place] 1 YR ago to re-establish relationship with mom, mom is unaware of what's going on with pt's condition. Mom's name is Enid Derry, [phone], last spoke with mom on yesterday. Pt states she has no emergency contact person, requesting Aunt not be called who is listed in epic.

## 2015-03-29 NOTE — Progress Notes (Signed)
[place] TEAM 1 - Stepdown/ICU TEAM Progress Note  Holly Mendoza LZJ:[id number] DOB: [birth year] DOA: 03/28/2015 PCP: Dorrene German, MD  Admit HPI / Brief Narrative: 44 y.o. 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 Patient reported progressively worsening shortness of breath over a week.  She was given levaquin for possible pna but reported she did not take it.  She reported constant chest pain for a week, and worsening of chronic ascites.  She reported miss her last dialysis.  In the ED course she was found to be markedly fluid overloaded, elevation of troponin, mild elevation of potassium, and EKG with ? Mobitz I with QT prolongation of 520.  Nephro was consulted, and patient initially agreed with emergent dialysis.  She did not wish to have paracentesis done, She did not wish to have aggressive cardiac intervention however desired to remain full code.  HPI/Subjective: Despite the emergent need for hemodialysis the patient signed herself off of hemodialysis almost 2 hours early yesterday.  Similarly she refused her scheduled cardiac cath today.  Despite this when speaking with palliative care today she reported that she wished to remain full code.  Her reasoning relates to her dialysate fluid as is well documented by the Nephrologist who have been counseling her on this issue for quite some time.  Nonetheless she remains stubbornly noncompliant despite frequent warnings as to the potential severe consequences to include death of her behavior.  Assessment/Plan:  Severe volume overload due to noncompliance w/ HD Dialysis is the only effective treatment - the patient is now saying she will agree to comply with dialysis in the morning  Large pericardial effusion As per above another sequelae of  noncompliance with hemodialysis  ESRD w/ noncompliance w/ HD Nephrology is well aware of her concerns regarding natralyte, and has been working with her to determine if there is an alternative available  Hyperkalemia due to noncompliance w/ HD As noted above the patient is currently stating she will comply with hemodialysis tomorrow  Acute on chronic diastolic congestive heart failure Volume management can only be accomplished through effective hemodialysis  Elevated troponin - NSTEMI She refused planned cardiac cath today - this is the second time she has been advised to have cath and refused on the day of the procedure (also March 2016)  Cirrhosis / chronic ascites  Ascites management per hemodialysis  HTN  Code Status: FULL Family Communication: no family present at time of exam Disposition Plan: SDU  Consultants: Nephrology Cardiology   Procedures: None  Antibiotics: None  DVT prophylaxis: Subcutaneous heparin  Objective: Blood pressure 98/57, pulse 31, temperature 98.7 F (37.1 C), temperature source Oral, resp. rate 23, height 5' 9.5" (1.765 m), weight 82.4 kg (181 lb 10.5 oz), SpO2 98 %.  Intake/Output Summary (Last 24 hours) at 03/29/15 1544 Last data filed at 03/29/15 0444  Gross per 24 hour  Intake      0 ml  Output   1486 ml  Net  -1486 ml   Exam: General: No acute respiratory distress - alert and oriented Lungs: Coarse crackles throughout all fields to include apices with no active wheeze Cardiovascular: Regular rate and rhythm without murmur gallop or rub normal S1 and S2 Abdomen: Nontender, protuberant with suggestion of ascites, soft, bowel sounds positive, no rebound Extremities: No significant cyanosis, or clubbing;  2+edema bilateral lower extremities  Data Reviewed: Basic Metabolic Panel:  Recent Labs Lab 03/22/15 1630 03/28/15 1450 03/29/15 0050 03/29/15 0541  NA 134* 135  --  137  K 6.1* 5.6*  --  5.1  CL 96* 95*  --  93*  CO2 17*  19*  --  24  GLUCOSE 103* 101*  --  99  BUN 151* 131*  --  90*  CREATININE 17.08* 16.34* 16.42* 12.55*  CALCIUM 7.9* 7.3*  --  7.7*  PHOS 13.2*  --   --   --     CBC:  Recent Labs Lab 03/22/15 1630 03/28/15 1450 03/29/15 0050 03/29/15 0541  WBC 13.6* 11.8* 12.2* 11.9*  HGB 11.0* 9.8* 10.1* 10.6*  HCT 33.0* 30.0* 30.0* 32.9*  MCV 84.4 85.2 83.3 84.6  PLT 304 242 258 246    Liver Function Tests:  Recent Labs Lab 03/22/15 1630  ALBUMIN 1.7*   Coags:  Recent Labs Lab 03/29/15 0541  INR 1.26   Cardiac Enzymes:  Recent Labs Lab 03/28/15 1450 03/29/15 0050 03/29/15 0541 03/29/15 1345  TROPONINI 1.13* 0.74* 0.73* 0.65*    CBG: No results for input(s): GLUCAP in the last 168 hours.  Recent Results (from the past 240 hour(s))  MRSA PCR Screening     Status: None   Collection Time: 03/29/15 12:01 AM  Result Value Ref Range Status   MRSA by PCR NEGATIVE NEGATIVE Final    Comment:        The GeneXpert MRSA Assay (FDA approved for NASAL specimens only), is one component of a comprehensive MRSA colonization surveillance program. It is not intended to diagnose MRSA infection nor to guide or monitor treatment for MRSA infections.      Studies:   Recent x-ray studies have been reviewed in detail by the Attending Physician  Scheduled Meds:  Scheduled Meds: . aspirin  81 mg Oral Pre-Cath  . aspirin EC  81 mg Oral Daily  . calcium acetate  667 mg Oral TID WC  . famotidine (PEPCID) IV  20 mg Intravenous Pre-Cath  . heparin subcutaneous  5,000 Units Subcutaneous 3 times per day  . predniSONE  60 mg Oral Pre-Cath  . senna-docusate  2 tablet Oral BID  . sodium chloride  3 mL Intravenous Q12H    Time spent on care of this patient: 35 mins   Cynthie Garmon T , MD   [facility] Office  [phone] Pager - Text Page per Loretha Stapler as per below:  On-Call/Text Page:      [email or web address]      password [email or web address]  If 7PM-7AM, please contact  night-coverage [email or web address] Password [email or web address] 03/29/2015, 3:44 PM   LOS: 1 day

## 2015-03-29 NOTE — Progress Notes (Signed)
Notified by telemetry patients EKG very irregular at this time.  Administered a Stat 12 Lead EKG at bedside resulted with a Right bundle branch block.  Patient continues to refuse Cardiac Cath.  Updated Dr. Sharon Seller on change in status received no new orders at this time.  Will continue to monitor.

## 2015-03-29 NOTE — Progress Notes (Signed)
Attempted to obtain consent for cardiac cath procedure. Pt. Refuses to have the procedure done and does not want to sign consent.

## 2015-03-30 DIAGNOSIS — Z9119 Patient's noncompliance with other medical treatment and regimen: Secondary | ICD-10-CM

## 2015-03-30 DIAGNOSIS — R7989 Other specified abnormal findings of blood chemistry: Secondary | ICD-10-CM

## 2015-03-30 DIAGNOSIS — I3139 Other pericardial effusion (noninflammatory): Secondary | ICD-10-CM | POA: Insufficient documentation

## 2015-03-30 DIAGNOSIS — I319 Disease of pericardium, unspecified: Secondary | ICD-10-CM

## 2015-03-30 DIAGNOSIS — I313 Pericardial effusion (noninflammatory): Secondary | ICD-10-CM | POA: Insufficient documentation

## 2015-03-30 DIAGNOSIS — Z91199 Patient's noncompliance with other medical treatment and regimen due to unspecified reason: Secondary | ICD-10-CM

## 2015-03-30 LAB — COMPREHENSIVE METABOLIC PANEL
ALT: 25 U/L (ref 14–54)
ANION GAP: 22 — AB (ref 5–15)
AST: 22 U/L (ref 15–41)
Albumin: 1.4 g/dL — ABNORMAL LOW (ref 3.5–5.0)
Alkaline Phosphatase: 112 U/L (ref 38–126)
BILIRUBIN TOTAL: 0.4 mg/dL (ref 0.3–1.2)
BUN: 116 mg/dL — AB (ref 6–20)
CO2: 19 mmol/L — AB (ref 22–32)
Calcium: 7.6 mg/dL — ABNORMAL LOW (ref 8.9–10.3)
Chloride: 93 mmol/L — ABNORMAL LOW (ref 101–111)
Creatinine, Ser: 14.27 mg/dL — ABNORMAL HIGH (ref 0.44–1.00)
GFR calc Af Amer: 3 mL/min — ABNORMAL LOW (ref >60)
GFR calc non Af Amer: 3 mL/min — ABNORMAL LOW (ref >60)
Glucose, Bld: 128 mg/dL — ABNORMAL HIGH (ref 65–99)
POTASSIUM: 5.1 mmol/L (ref 3.5–5.1)
SODIUM: 134 mmol/L — AB (ref 135–145)
Total Protein: 4.8 g/dL — ABNORMAL LOW (ref 6.5–8.1)

## 2015-03-30 LAB — CBC
HCT: 29.9 % — ABNORMAL LOW (ref 36.0–46.0)
Hemoglobin: 9.7 g/dL — ABNORMAL LOW (ref 12.0–15.0)
MCH: 27.2 pg (ref 26.0–34.0)
MCHC: 32.4 g/dL (ref 30.0–36.0)
MCV: 83.8 fL (ref 78.0–100.0)
Platelets: 278 10*3/uL (ref 150–400)
RBC: 3.57 MIL/uL — ABNORMAL LOW (ref 3.87–5.11)
RDW: 14.7 % (ref 11.5–15.5)
WBC: 12.6 10*3/uL — ABNORMAL HIGH (ref 4.0–10.5)

## 2015-03-30 LAB — PHOSPHORUS: PHOSPHORUS: 11.6 mg/dL — AB (ref 2.5–4.6)

## 2015-03-30 LAB — HEPATITIS B SURFACE ANTIGEN: Hepatitis B Surface Ag: NEGATIVE

## 2015-03-30 MED ORDER — SODIUM CHLORIDE 0.9 % IV SOLN: 100.0000 mL | INTRAVENOUS | Status: DC | PRN

## 2015-03-30 MED ORDER — HEPARIN SODIUM (PORCINE) 1000 UNIT/ML DIALYSIS: 1000.0000 [IU] | INTRAMUSCULAR | Status: DC | PRN

## 2015-03-30 MED ORDER — PENTAFLUOROPROP-TETRAFLUOROETH EX AERO
1.0000 "application " | INHALATION_SPRAY | CUTANEOUS | Status: DC | PRN
  Administered 2015-03-30: 1 via TOPICAL

## 2015-03-30 MED ORDER — LIDOCAINE HCL (PF) 1 % IJ SOLN: 5.0000 mL | INTRAMUSCULAR | Status: DC | PRN

## 2015-03-30 MED ORDER — RENA-VITE PO TABS
1.0000 | ORAL_TABLET | Freq: Every day | ORAL | Status: DC
  Administered 2015-03-30 – 2015-04-06 (×6): 1 via ORAL
  Filled 2015-03-30 (×10): qty 1

## 2015-03-30 MED ORDER — ALTEPLASE 2 MG IJ SOLR
2.0000 mg | Freq: Once | INTRAMUSCULAR | Status: DC | PRN
  Filled 2015-03-30: qty 2

## 2015-03-30 MED ORDER — LIDOCAINE-PRILOCAINE 2.5-2.5 % EX CREA
1.0000 "application " | TOPICAL_CREAM | CUTANEOUS | Status: DC | PRN
  Filled 2015-03-30: qty 5

## 2015-03-30 MED ORDER — HEPARIN SODIUM (PORCINE) 1000 UNIT/ML DIALYSIS: 100.0000 [IU]/kg | INTRAMUSCULAR | Status: DC | PRN

## 2015-03-30 MED ORDER — NEPRO/CARBSTEADY PO LIQD
237.0000 mL | ORAL | Status: DC | PRN
  Filled 2015-03-30: qty 237

## 2015-03-30 MED ORDER — PENTAFLUOROPROP-TETRAFLUOROETH EX AERO
INHALATION_SPRAY | CUTANEOUS | Status: AC
  Administered 2015-03-30: 1 via TOPICAL
  Filled 2015-03-30: qty 103.5

## 2015-03-30 NOTE — Progress Notes (Signed)
Subjective: Interval History: has no complaint, to me.  Still refusing things..  Objective: Vital signs in last 24 hours: Temp:  [97.3 F (36.3 C)-98.7 F (37.1 C)] 97.3 F (36.3 C) (06/11 0858) Pulse Rate:  [31-111] 84 (06/11 0903) Resp:  [12-25] 24 (06/11 0903) BP: (97-111)/(56-79) 111/63 mmHg (06/11 0903) SpO2:  [92 %-99 %] 96 % (06/11 0903) Weight:  [82 kg (180 lb 12.4 oz)-86.7 kg (191 lb 2.2 oz)] 82 kg (180 lb 12.4 oz) (06/11 0858) Weight change: 10.042 kg (22 lb 2.2 oz)  Intake/Output from previous day: 06/10 0701 - 06/11 0700 In: 960 [P.O.:960] Out: -  Intake/Output this shift:    General appearance: chronically ill Resp: diminished breath sounds bilaterally and rales bibasilar Cardio: S1, S2 normal, systolic murmur: holosystolic 3/6, blowing at apex and friction rub heard rub at LLSB GI: distended, pos bs , liver down 5 cm Extremities: edema 3+  Lab Results:  Recent Labs  03/29/15 0050 03/29/15 0541  WBC 12.2* 11.9*  HGB 10.1* 10.6*  HCT 30.0* 32.9*  PLT 258 246   BMET:  Recent Labs  03/28/15 1450 03/29/15 0050 03/29/15 0541  NA 135  --  137  K 5.6*  --  5.1  CL 95*  --  93*  CO2 19*  --  24  GLUCOSE 101*  --  99  BUN 131*  --  90*  CREATININE 16.34* 16.42* 12.55*  CALCIUM 7.3*  --  7.7*   No results for input(s): PTH in the last 72 hours. Iron Studies: No results for input(s): IRON, TIBC, TRANSFERRIN, FERRITIN in the last 72 hours.  Studies/Results: Dg Chest 2 View  03/28/2015   CLINICAL DATA:  Increased shortness of breath. End-stage renal disease with dialysis. The patient did not go to dialysis yesterday.  EXAM: CHEST - 2 VIEW  COMPARISON:  Two-view chest 03/22/2015  FINDINGS: The heart is enlarged. Interstitial edema has increased since the prior exam. Small bilateral pleural effusions are noted. Bibasilar airspace disease likely reflects atelectasis.  IMPRESSION: 1. Cardiomegaly and progressive edema and bilateral effusions compatible with  congestive heart failure. 2. Bibasilar spur is disease likely reflects atelectasis.   Electronically Signed   By: Marin Roberts M.D.   On: 03/28/2015 15:12    I have reviewed the patient's current medications.  Assessment/Plan: 1 ESRD  Uremic, underdialyzed , fluid overload.  Claims allergic to bath but only components are e'lytes.  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

## 2015-03-30 NOTE — Progress Notes (Signed)
Pt HD tx started with no complications. Pt. Refused heparin. She said she bleeds too heavily after tx.

## 2015-03-30 NOTE — Progress Notes (Signed)
Pt states she is signing off treatment 2 hours early. Says she can feel her blood pressure is too low. Refused midodrine. MD notified.

## 2015-03-30 NOTE — Plan of Care (Signed)
Problem: Phase I Progression Outcomes Goal: OOB as tolerated unless otherwise ordered Outcome: Progressing Patient ambulating with assistance.  Comments:  Patient increasing ambulation.

## 2015-03-30 NOTE — Progress Notes (Signed)
Consulting cardiologist: Dr. Zoila Shutter  Seen for followup: Abnormal troponins, diastolic heart failure  Subjective:    Currently on hemodialysis, Complains of chest pain in setting of hypotension (she refused midodrine).  Objective:   Temp:  [97.3 F (36.3 C)-98.7 F (37.1 C)] 97.3 F (36.3 C) (06/11 0858) Pulse Rate:  [31-111] 84 (06/11 1045) Resp:  [12-30] 22 (06/11 1045) BP: (82-111)/(56-79) 93/60 mmHg (06/11 1045) SpO2:  [92 %-100 %] 96 % (06/11 1030) Weight:  [180 lb 12.4 oz (82 kg)-191 lb 2.2 oz (86.7 kg)] 180 lb 12.4 oz (82 kg) (06/11 0858) Last BM Date: 03/31/15  Filed Weights   03/29/15 0312 03/30/15 0418 03/30/15 0858  Weight: 181 lb 10.5 oz (82.4 kg) 191 lb 2.2 oz (86.7 kg) 180 lb 12.4 oz (82 kg)    Intake/Output Summary (Last 24 hours) at 03/30/15 1114 Last data filed at 03/30/15 0300  Gross per 24 hour  Intake    720 ml  Output      0 ml  Net    720 ml    Telemetry: Sinus rhythm.  Exam:  General: No distress.  Lungs: Clear, nonlabored.  Cardiac: RRR with 2/6 systolic murmur.  Extremities: No edema.  Lab Results:  Basic Metabolic Panel:  Recent Labs Lab 03/28/15 1450 03/29/15 0050 03/29/15 0541 03/30/15 0932  NA 135  --  137 134*  K 5.6*  --  5.1 5.1  CL 95*  --  93* 93*  CO2 19*  --  24 19*  GLUCOSE 101*  --  99 128*  BUN 131*  --  90* 116*  CREATININE 16.34* 16.42* 12.55* 14.27*  CALCIUM 7.3*  --  7.7* 7.6*    Liver Function Tests:  Recent Labs Lab 03/30/15 0932  AST 22  ALT 25  ALKPHOS 112  BILITOT 0.4  PROT 4.8*  ALBUMIN 1.4*    CBC:  Recent Labs Lab 03/29/15 0050 03/29/15 0541 03/30/15 0932  WBC 12.2* 11.9* 12.6*  HGB 10.1* 10.6* 9.7*  HCT 30.0* 32.9* 29.9*  MCV 83.3 84.6 83.8  PLT 258 246 278    Cardiac Enzymes:  Recent Labs Lab 03/29/15 0050 03/29/15 0541 03/29/15 1345  TROPONINI 0.74* 0.73* 0.65*    BNP:  Recent Labs  08/31/14 1806  PROBNP 46663.0*    Coagulation:  Recent  Labs Lab 03/29/15 0541  INR 1.26    Echocardiogram 03/29/2015: Study Conclusions  - Left ventricle: The cavity size was normal. The estimated ejection fraction was 60%. Wall motion was normal; there were no regional wall motion abnormalities. - Mitral valve: Mitral annular calcification. Thickening and calcification of anterior leaflet. No doppler data to assess inflow, but I doubt severe MS. - Right ventricle: Mild dilitation. There is no RV free wall diastolic collapse. - Inferior vena cava: The vessel was dilated. The respirophasic diameter changes were blunted (< 50%), consistent with elevated central venous pressure. - Pericardium, extracardiac: There is a large circumferential pericardial effusion. There is no variation in mitral inflow signal to suggest tamponade. But this effusion is definitely significant. - Impressions: On the study of 12/2014, there was a small pericardial effusion. The effusion now is much larger.  Impressions:  - On the study of 12/2014, there was a small pericardial effusion. The effusion now is much larger.   Medications:   Scheduled Medications: . aspirin EC  81 mg Oral Daily  . calcium acetate  667 mg Oral TID WC  . heparin subcutaneous  5,000 Units Subcutaneous 3 times  per day  . multivitamin  1 tablet Oral QHS  . senna-docusate  2 tablet Oral BID      PRN Medications:  sodium chloride, sodium chloride, sodium chloride, sodium chloride, alteplase, feeding supplement (NEPRO CARB STEADY), fentaNYL (SUBLIMAZE) injection, heparin, heparin, heparin, lidocaine (PF), lidocaine-prilocaine, pentafluoroprop-tetrafluoroeth, zolpidem   Assessment:   1. Abnormal troponin I, NSTEMI considered but could be secondary to ESRD and uremic pericarditis with effusion. Cardiac catheterization has been discussed as per review and rounding notes - patient has declined but may be reconsidering for early next week.  2. Large  pericardial effusion, possibly uremic with noncompliance.  3. ESRD, hemodialysis today.  4. Diastolic heart failure  Complicated by noncompliance and volume overload with inadequate hemodialysis.   Plan/Discussion:    Reviewed hospital course and Dr. Ludwig Clarks note from yesterday. Patient states considering whether she will pursue further cardiac testing. Also note that Palliative Care has been involved. Our service will follow with you. Continue ASA.   Jonelle Sidle, M.D., F.A.C.C.

## 2015-03-30 NOTE — Progress Notes (Signed)
[place] TEAM 1 - Stepdown/ICU TEAM Progress Note  Holly Mendoza ZOX:[id number] DOB: 04/05/1971 DOA: 03/28/2015 PCP: Dorrene German, MD  Admit HPI / Brief Narrative: 44 y.o. 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 Patient reported progressively worsening shortness of breath over a week.  She was given levaquin for possible pna but reported she did not take it.  She reported constant chest pain for a week, and worsening of chronic ascites.  She reported miss her last dialysis.  In the ED course she was found to be markedly fluid overloaded, elevation of troponin, mild elevation of potassium, and EKG with ? Mobitz I with QT prolongation of 520.  Nephro was consulted, and patient initially agreed with emergent dialysis.  She did not wish to have paracentesis done, She did not wish to have aggressive cardiac intervention however desired to remain full code.  HPI/Subjective: Patient reportedly signed off hemodialysis early again today.  Per report she was experiencing some hypotension and when minute drain was prescribed she refused to take it and signed off.  I have had a very frank discussion with her today explaining that all of her complaints and current symptoms are directly related to the fact that she will not allow [place] to provide adequate hemodialysis.  She persists in what I interpret as the need to control every minute portion of her care.  She has made it clear to me that she feels she knows more about dialysis and its administration then the dialysis team or doctors.  I have assured her that the nephrologist are very knowledgeable and 100% competent but this seems to have no impact on her decision making.  She continues to tell me that she wants dialysis but then at the same time contradicts herself by  explaining that she is refusing dialysis "because it is making me feel terrible."  When I explain that the lack of dialysis is actually what is driving her symptoms she simply quits talking to me.  Assessment/Plan:  Severe volume overload due to noncompliance w/ HD Dialysis is the only effective treatment - unfortunately the patient is noncompliant behavior has not changed - I have explained to her my fear that she will die soon if she does not comply with frequent long dialysis treatment periods  Large pericardial effusion As per above another sequelae of noncompliance with hemodialysis  ESRD w/ noncompliance w/ HD Nephrology is well aware of her concerns regarding natralyte, and has been working with her to determine if there is an alternative available - today however the primary reason it appears she signed off was because the nephrologist wanted to dose her with midodrine to tx her hypotension and allow HD to continue   Hyperkalemia due to noncompliance w/ HD Inadequately treated due to noncompliance with dialysis  Acute on chronic diastolic congestive heart failure Volume management can only be accomplished through effective hemodialysis - she has not allowed this to happen  Elevated troponin - NSTEMI She refused planned cardiac cath 6/10 - this is the second time she has been advised to have cath and refused on the day of the procedure (also March 2016)  Cirrhosis / chronic ascites  Ascites management per hemodialysis  HTN  Code Status: FULL Family Communication: no family present at time of exam Disposition Plan: SDU  Consultants:  Nephrology Cardiology   Procedures: None  Antibiotics: None  DVT prophylaxis: Subcutaneous heparin  Objective: Blood pressure 99/66, pulse 96, temperature 97.3 F (36.3 C), temperature source Oral, resp. rate 23, height 5' 9.5" (1.765 m), weight 81.7 kg (180 lb 1.9 oz), SpO2 100 %.  Intake/Output Summary (Last 24 hours) at 03/30/15  1453 Last data filed at 03/30/15 1105  Gross per 24 hour  Intake    480 ml  Output    432 ml  Net     48 ml   Exam: General: alert and oriented Lungs: Coarse crackles throughout all fields to include apices with no active wheeze Cardiovascular: Regular rate and rhythm without murmur gallop or rub Abdomen: Nontender, protuberant with suggestion of ascites, soft, bowel sounds positive, no rebound Extremities: No significant cyanosis, or clubbing;  persistent 2+edema bilateral lower extremities  Data Reviewed: Basic Metabolic Panel:  Recent Labs Lab 03/28/15 1450 03/29/15 0050 03/29/15 0541 03/30/15 0932  NA 135  --  137 134*  K 5.6*  --  5.1 5.1  CL 95*  --  93* 93*  CO2 19*  --  24 19*  GLUCOSE 101*  --  99 128*  BUN 131*  --  90* 116*  CREATININE 16.34* 16.42* 12.55* 14.27*  CALCIUM 7.3*  --  7.7* 7.6*  PHOS  --   --   --  11.6*    CBC:  Recent Labs Lab 03/28/15 1450 03/29/15 0050 03/29/15 0541 03/30/15 0932  WBC 11.8* 12.2* 11.9* 12.6*  HGB 9.8* 10.1* 10.6* 9.7*  HCT 30.0* 30.0* 32.9* 29.9*  MCV 85.2 83.3 84.6 83.8  PLT 242 258 246 278    Liver Function Tests:  Recent Labs Lab 03/30/15 0932  AST 22  ALT 25  ALKPHOS 112  BILITOT 0.4  PROT 4.8*  ALBUMIN 1.4*   Coags:  Recent Labs Lab 03/29/15 0541  INR 1.26   Cardiac Enzymes:  Recent Labs Lab 03/28/15 1450 03/29/15 0050 03/29/15 0541 03/29/15 1345  TROPONINI 1.13* 0.74* 0.73* 0.65*    Recent Results (from the past 240 hour(s))  MRSA PCR Screening     Status: None   Collection Time: 03/29/15 12:01 AM  Result Value Ref Range Status   MRSA by PCR NEGATIVE NEGATIVE Final    Comment:        The GeneXpert MRSA Assay (FDA approved for NASAL specimens only), is one component of a comprehensive MRSA colonization surveillance program. It is not intended to diagnose MRSA infection nor to guide or monitor treatment for MRSA infections.   Wound culture     Status: None (Preliminary  result)   Collection Time: 03/29/15 12:50 AM  Result Value Ref Range Status   Specimen Description WOUND LEFT THIGH  Final   Special Requests NONE  Final   Gram Stain PENDING  Incomplete   Culture   Final    Culture reincubated for better growth Performed at [facility]    Report Status PENDING  Incomplete  Culture, blood (routine x 2)     Status: None (Preliminary result)   Collection Time: 03/29/15 12:55 AM  Result Value Ref Range Status   Specimen Description BLOOD HEMODIALYSIS CATHETER  Final   Special Requests BOTTLES DRAWN AEROBIC AND ANAEROBIC 10CC  Final   Culture   Final           BLOOD CULTURE RECEIVED NO GROWTH TO DATE CULTURE WILL BE HELD FOR 5 DAYS BEFORE ISSUING A FINAL NEGATIVE REPORT Performed at [facility]    Report Status PENDING  Incomplete  Culture, blood (routine x 2)     Status: None (Preliminary result)   Collection Time: 03/29/15  1:45 AM  Result Value Ref Range Status   Specimen Description BLOOD HEMODIALYSIS CATHETER  Final   Special Requests BOTTLES DRAWN AEROBIC AND ANAEROBIC 10CC  Final   Culture   Final           BLOOD CULTURE RECEIVED NO GROWTH TO DATE CULTURE WILL BE HELD FOR 5 DAYS BEFORE ISSUING A FINAL NEGATIVE REPORT Performed at [facility]    Report Status PENDING  Incomplete     Studies:   Recent x-ray studies have been reviewed in detail by the Attending Physician  Scheduled Meds:  Scheduled Meds: . aspirin EC  81 mg Oral Daily  . calcium acetate  667 mg Oral TID WC  . heparin subcutaneous  5,000 Units Subcutaneous 3 times per day  . multivitamin  1 tablet Oral QHS  . senna-docusate  2 tablet Oral BID    Time spent on care of this patient: 35 mins   MCCLUNG,JEFFREY T , MD   [facility] Office  [phone] Pager - Text Page per Loretha Stapler as per below:  On-Call/Text Page:      [email or web address]      password [email or web address]  If 7PM-7AM, please contact night-coverage [email or web address] Password [email or web address] 03/30/2015,  2:53 PM   LOS: 2 days

## 2015-03-30 NOTE — Progress Notes (Signed)
Patient off floor with RN for HD.

## 2015-03-30 NOTE — Progress Notes (Signed)
HD tx ended 2 hours early by patient. Low bp, patient refused extra saline and meds to raise sbp. MD notified. Report given. Pt returned safely to room. CCMD notified.

## 2015-03-30 NOTE — Progress Notes (Signed)
Daily Progress Note   Patient Name: Holly Mendoza       Date: 03/30/2015 DOB: [birth year]  Age: 44 y.o. MRN#: [id number] Attending Physician: Cherene Altes, MD Primary Care Physician: Philis Fendt, MD Admit Date: 03/28/2015  Reason for Consultation/Follow-up: Establishing goals of care and Psychosocial/spiritual support  Met with pt for follow up support. She is still singularly focused on naturalyte dialysate as well as other aspects of dialysis treatment such refusing heparin. She states she knew her blood pressure was dropping and was asking for NS. Attempted to discuss other issues such as what her life was like prior to coming to [place], etc. She does acknowledge she was raised by other family members since the age of 65 . She denies abuse but states she thinks her mother " gave me up" because she was afraid of what she might do. She does have a h/o trauma she reports but would not specify. She has never married, no children. She states once she "got sick" she did not desire to have a relationship. She reports having friends in [place] as well as a church that was important to her. For the 3 years prior to moving to [place] she lived with her Aunt's mother who also helped raise her. Her faith has been a big source of support she states through out her life. She currently states that despite warnings of how serious her health condition currently is, she does not believe she will die because it is not God's plan for her. She shows little interest in the specifics of what is happening clinically which is interesting given its severity as well as the outward signs that she is clinically declining. She has a wet cough which I did not hear yesterday and she remarks "this is the naturalyte". . Tells me not to worry.  Religion is a big part of her life but she is able to talk about other issues without interjecting religious overtones. She denies any mental health hx except for seeing a therapist in the past.  She states she has never been psychiatrically hospitalized. There feels as though there is an abnormal focus on dialysis and specifically the naturalyte as I talk further with her today. She doesn't come right out and state that people are trying to harm her but does feel as though they turned the dialysis machine away from her line of sight so she could not see what they were doing. She states they did not do this to anyone else. Her goal is to get better and go to [employer] of dialysis company and plead her case. Her speech is not pressured. Thought processes are organized. Memory intact to recent and remote testing. She denies depression. States God is supporting her. This is a very unfortunate situation. There is some underlying issue that seems to driving her poor decisions to a point now that I fear could be too late. She is not overtly delusional or psychotic that I could elicit and would refuse medication even if that was clearly indicated. Perhaps family could help support goals that could improve her life? Pt continues to state that she wants dialysis and can find meaning in life and still be on dialysis but her behavior is incongruent. Will FU tomorrow and ask to speak to her sister to see if a meeting would be beneficial  Subjective: Pt now has a congested cough, and worsening periorbital edema. She did go to dialysis this am but would not stay. She is  c/o more chest pain Interval Events: Unable to complete dialysis this am again. ECHO shows worsening pericardial effusion Length of Stay: 2 days  Current Medications: Scheduled Meds:  . aspirin EC  81 mg Oral Daily  . calcium acetate  667 mg Oral TID WC  . heparin subcutaneous  5,000 Units Subcutaneous 3 times per day  . multivitamin  1 tablet Oral QHS  . senna-docusate  2 tablet Oral BID    Continuous Infusions:    PRN Meds: fentaNYL (SUBLIMAZE) injection, zolpidem  Palliative Performance Scale: 60%     Vital  Signs: BP 99/66 mmHg  Pulse 96  Temp(Src) 97.3 F (36.3 C) (Oral)  Resp 23  Ht 5' 9.5" (1.765 m)  Wt 81.7 kg (180 lb 1.9 oz)  BMI 26.23 kg/m2  SpO2 100% SpO2: SpO2: 100 % O2 Device: O2 Device: Not Delivered O2 Flow Rate: O2 Flow Rate (L/min): 2 L/min  Intake/output summary:  Intake/Output Summary (Last 24 hours) at 03/30/15 1524 Last data filed at 03/30/15 1105  Gross per 24 hour  Intake    480 ml  Output    432 ml  Net     48 ml   LBM:   Baseline Weight: Weight: 76.658 kg (169 lb) Most recent weight: Weight: 81.7 kg (180 lb 1.9 oz)  Physical Exam: General : Middle aged female. A/O x4. Coughing. Appears weak and ill Resp : bilat crackles Cardiac Irrg.Worseing periorbital edema            Additional Data Reviewed: Recent Labs     03/29/15  0541  03/30/15  0932  WBC  11.9*  12.6*  HGB  10.6*  9.7*  PLT  246  278  NA  137  134*  BUN  90*  116*  CREATININE  12.55*  14.27*     Problem List:  Patient Active Problem List   Diagnosis Date Noted  . Elevated troponin I level   . Noncompliance   . Pericardial effusion   . Palliative care encounter   . Chest pain with high risk for cardiac etiology 03/28/2015  . Acute on chronic diastolic CHF (congestive heart failure), NYHA class 4 03/28/2015  . ESRD (end stage renal disease) 03/28/2015  . Troponin level elevated 01/13/2015  . Chest pain at rest 01/13/2015  . Hyperkalemia 12/19/2014  . Abdominal pain 12/19/2014  . Anemia of renal disease 08/31/2014  . Acute pulmonary edema   . Ascites 07/11/2014  . 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 Volume overload 05/11/2014  . Pulmonary edema 05/07/2014  . ESRD (end stage renal disease) on dialysis 05/03/2014  . HTN (hypertension) 04/24/2014  . Prolonged Q-T interval on ECG 04/24/2014  . Hx of ascites 04/20/2014     Palliative Care Assessment & Plan    Code Status:  Full code  Goals of Care:  No Changes to code status or  goals to transition to comfort  Desire for further Chaplaincy support:yes. Needs help with HCPOA as well as spiritual suport  3. Symptom Management:  Pain: Pt c/o chest pain more frequently today. Getting fentanyl 25 mcg q2 prn. See more boluses being utilized. No changes at this point. Pt's overall condition very fragile    4. Prognosis: Unable to determine. Pt high risk for acute cardiopulmonary failure secondary to volume overload and HD non-compliance  5. Discharge Planning: TBD     Thank you for allowing the Palliative Medicine Team to assist in the care of this patient.  Time In: 1500 Time Out: 1545 Total Time 45 Prolonged Time Billed no    Greater than 50%  of this time was spent counseling and coordinating care related to the above assessment and plan.   Dory Horn, NP  03/30/2015, 3:24 PM  Please contact Palliative Medicine Team phone at [phone] for questions and concerns.

## 2015-03-30 NOTE — Procedures (Signed)
I was present at this session.  I have reviewed the session itself and made appropriate changes.  HD via fem avg.  Access press ok.  Encouraged to stay on HD  Sheralyn Pinegar L 6/11/20169:22 AM

## 2015-03-30 NOTE — Progress Notes (Signed)
Pt sbp low. Refusing midodrine and for me to lay her back. UF off.

## 2015-03-31 LAB — RENAL FUNCTION PANEL
ALBUMIN: 1.5 g/dL — AB (ref 3.5–5.0)
Anion gap: 20 — ABNORMAL HIGH (ref 5–15)
BUN: 85 mg/dL — ABNORMAL HIGH (ref 6–20)
CALCIUM: 7.4 mg/dL — AB (ref 8.9–10.3)
CHLORIDE: 92 mmol/L — AB (ref 101–111)
CO2: 21 mmol/L — AB (ref 22–32)
Creatinine, Ser: 11.49 mg/dL — ABNORMAL HIGH (ref 0.44–1.00)
GFR, EST AFRICAN AMERICAN: 4 mL/min — AB (ref >60)
GFR, EST NON AFRICAN AMERICAN: 4 mL/min — AB (ref >60)
Glucose, Bld: 92 mg/dL (ref 65–99)
PHOSPHORUS: 10.4 mg/dL — AB (ref 2.5–4.6)
POTASSIUM: 5.1 mmol/L (ref 3.5–5.1)
Sodium: 133 mmol/L — ABNORMAL LOW (ref 135–145)

## 2015-03-31 LAB — WOUND CULTURE: Gram Stain: NONE SEEN

## 2015-03-31 MED ORDER — FENTANYL CITRATE (PF) 100 MCG/2ML IJ SOLN
25.0000 ug | INTRAMUSCULAR | Status: DC | PRN
Start: 2015-03-31 — End: 2015-04-07
  Administered 2015-03-31 – 2015-04-04 (×22): 25 ug via INTRAVENOUS
  Filled 2015-03-31 (×21): qty 2

## 2015-03-31 NOTE — Progress Notes (Signed)
Subjective: Interval History: has complaints wants UF. .  Objective: Vital signs in last 24 hours: Temp:  [97.3 F (36.3 C)-98.3 F (36.8 C)] 98.2 F (36.8 C) (06/12 0819) Pulse Rate:  [84-111] 101 (06/12 0415) Resp:  [19-35] 19 (06/12 0820) BP: (82-118)/(58-98) 97/65 mmHg (06/12 0820) SpO2:  [94 %-100 %] 94 % (06/12 0415) Weight:  [81.7 kg (180 lb 1.9 oz)-85.3 kg (188 lb 0.8 oz)] 85.3 kg (188 lb 0.8 oz) (06/12 0415) Weight change: -4.7 kg (-10 lb 5.8 oz)  Intake/Output from previous day: 06/11 0701 - 06/12 0700 In: 830 [P.O.:830] Out: 432  Intake/Output this shift: Total I/O In: 120 [P.O.:120] Out: -   General appearance: cooperative and slowed mentation Resp: diminished breath sounds bibasilar and rales bibasilar Cardio: S1, S2 normal and friction rub heard LLSB GI: pos FW, liver down 6 cm Extremities: edema 3+  Lab Results:  Recent Labs  03/29/15 0541 03/30/15 0932  WBC 11.9* 12.6*  HGB 10.6* 9.7*  HCT 32.9* 29.9*  PLT 246 278   BMET:  Recent Labs  03/30/15 0932 03/31/15 0303  NA 134* 133*  K 5.1 5.1  CL 93* 92*  CO2 19* 21*  GLUCOSE 128* 92  BUN 116* 85*  CREATININE 14.27* 11.49*  CALCIUM 7.6* 7.4*   No results for input(s): PTH in the last 72 hours. Iron Studies: No results for input(s): IRON, TIBC, TRANSFERRIN, FERRITIN in the last 72 hours.  Studies/Results: No results found.  I have reviewed the patient's current medications.  Assessment/Plan: 1 ESRD Uremic , vol xs,  BP falling suspect pericardial pressure and pain meds.   UF dangerous in setting of pericardial inflam, will not offer.  Explained that controlled UF occuring during dialysis is the same thing 2 NONADHERENCE primary issue  . Lack of insight and inappropriate fixation 3 Anemia 4 Ascites uremic ? Pericardial constriction 5 HPTH  6 ^ Phos  Not taking meds reg P counsel.  Will not offer extra treatments as is leaving dialysis so is a waste.  This care is approaching futile    LOS: 3 days   Raea Magallon L 03/31/2015,8:35 AM

## 2015-03-31 NOTE — Progress Notes (Signed)
Patient ID: Sharlyn Odonnel, female   DOB: 05-30-1971, 44 y.o.   MRN: [id number]    Consulting cardiologist: Dr. Zoila Shutter  Seen for followup: Abnormal troponins, diastolic heart failure  Subjective:    Coughing with productive sputum  Objective:   Temp:  [97.3 F (36.3 C)-98.3 F (36.8 C)] 98.2 F (36.8 C) (06/12 0819) Pulse Rate:  [84-110] 101 (06/12 0415) Resp:  [19-35] 22 (06/12 0937) BP: (82-118)/(58-98) 105/62 mmHg (06/12 0937) SpO2:  [94 %-100 %] 98 % (06/12 0937) Weight:  [81.7 kg (180 lb 1.9 oz)-85.3 kg (188 lb 0.8 oz)] 85.3 kg (188 lb 0.8 oz) (06/12 0415) Last BM Date: 03/30/15  Filed Weights   03/30/15 0858 03/30/15 1105 03/31/15 0415  Weight: 82 kg (180 lb 12.4 oz) 81.7 kg (180 lb 1.9 oz) 85.3 kg (188 lb 0.8 oz)    Intake/Output Summary (Last 24 hours) at 03/31/15 0939 Last data filed at 03/31/15 0831  Gross per 24 hour  Intake    800 ml  Output    432 ml  Net    368 ml    Telemetry: Sinus rhythm.  Exam:  General: No distress.  Lungs: Clear, nonlabored.  Cardiac: RRR with 2/6 systolic murmur. ? LUE  Extremities: No edema.  Non functioning fistula LUE  Fistula in  LLE femoral   Lab Results:  Basic Metabolic Panel:  Recent Labs Lab 03/29/15 0541 03/30/15 0932 03/31/15 0303  NA 137 134* 133*  K 5.1 5.1 5.1  CL 93* 93* 92*  CO2 24 19* 21*  GLUCOSE 99 128* 92  BUN 90* 116* 85*  CREATININE 12.55* 14.27* 11.49*  CALCIUM 7.7* 7.6* 7.4*    Liver Function Tests:  Recent Labs Lab 03/30/15 0932 03/31/15 0303  AST 22  --   ALT 25  --   ALKPHOS 112  --   BILITOT 0.4  --   PROT 4.8*  --   ALBUMIN 1.4* 1.5*    CBC:  Recent Labs Lab 03/29/15 0050 03/29/15 0541 03/30/15 0932  WBC 12.2* 11.9* 12.6*  HGB 10.1* 10.6* 9.7*  HCT 30.0* 32.9* 29.9*  MCV 83.3 84.6 83.8  PLT 258 246 278    Cardiac Enzymes:  Recent Labs Lab 03/29/15 0050 03/29/15 0541 03/29/15 1345  TROPONINI 0.74* 0.73* 0.65*    BNP:  Recent Labs  08/31/14 1806  PROBNP 46663.0*    Coagulation:  Recent Labs Lab 03/29/15 0541  INR 1.26    Echocardiogram 03/29/2015: Study Conclusions  - Left ventricle: The cavity size was normal. The estimated ejection fraction was 60%. Wall motion was normal; there were no regional wall motion abnormalities. - Mitral valve: Mitral annular calcification. Thickening and calcification of anterior leaflet. No doppler data to assess inflow, but I doubt severe MS. - Right ventricle: Mild dilitation. There is no RV free wall diastolic collapse. - Inferior vena cava: The vessel was dilated. The respirophasic diameter changes were blunted (< 50%), consistent with elevated central venous pressure. - Pericardium, extracardiac: There is a large circumferential pericardial effusion. There is no variation in mitral inflow signal to suggest tamponade. But this effusion is definitely significant. - Impressions: On the study of 12/2014, there was a small pericardial effusion. The effusion now is much larger.  Impressions:  - On the study of 12/2014, there was a small pericardial effusion. The effusion now is much larger.   Medications:   Scheduled Medications: . aspirin EC  81 mg Oral Daily  . calcium acetate  667 mg Oral TID  WC  . heparin subcutaneous  5,000 Units Subcutaneous 3 times per day  . multivitamin  1 tablet Oral QHS  . senna-docusate  2 tablet Oral BID     PRN Medications: fentaNYL (SUBLIMAZE) injection, zolpidem   Assessment:   1. Abnormal troponin I, NSTEMI considered but could be secondary to ESRD and uremic pericarditis with effusion. Cardiac catheterization has been discussed as per review and rounding notes - patient has declined but may be reconsidering for early next week.  2. Large pericardial effusion, possibly uremic with noncompliance. Discussed pericardiocentesis and she seemed surprised that no one mentioned this to her before Described  procedure and she indicates she would have to think about it  3. ESRD, hemodialysis limited weekly paracentesis  Plan per renal   4. Diastolic heart failure  Complicated by noncompliance and volume overload with inadequate hemodialysis.   Plan/Discussion:    Patient would likely benefit from pericardiocentesis  Echo with much larger effusion and rub on exam.  Rounder should discuss more in am Continue dialysis Palliative care has seen   Charlton Haws

## 2015-03-31 NOTE — Progress Notes (Signed)
Pt requesting to leave her alone while trying to give am medications and do a  complete assessment. She states." she has too many decisions to make and needs to sleep". Education given. Will continue to monitor.

## 2015-03-31 NOTE — Progress Notes (Signed)
[facility] TEAM 1 - Stepdown/ICU TEAM Progress Note  Holly Mendoza ZOX:[id number] DOB: [birth year] DOA: 03/28/2015 PCP: Dorrene German, MD  Admit HPI / Brief Narrative: 44 y.o. 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 Patient reported progressively worsening shortness of breath over a week.  She was given levaquin for possible pna but reported she did not take it.  She reported constant chest pain for a week, and worsening of chronic ascites.  She reported miss her last dialysis.  In the ED course she was found to be markedly fluid overloaded, elevation of troponin, mild elevation of potassium, and EKG with ? Mobitz I with QT prolongation of 520.  Nephro was consulted, and patient initially agreed with emergent dialysis.  She did not wish to have paracentesis done, She did not wish to have aggressive cardiac intervention however desired to remain full code.  HPI/Subjective: The patient's aunt and sister are present in the room at the time of my exam today.  We have all had a very frank discussion concerning the patient's current status.  I have explained to the family that virtually all of her current active problems are due to inadequate dialysis chronically.  I have explained that she is acutely at risk of sudden death and that in my opinion the only treatment that can affect this is full compliance with aggressive hemodialysis.  The patient continues to insist that she is willing to comply with hemodialysis despite her history to suggest otherwise.  When confronted with the fact that she has not yet completed a full hemodialysis treatment during this hospital stay she states it is because "they have it turned up so high it makes me sick and they will not listen to me or turn it down at all."  I have attempted  to explain that dialysis protocols are beyond my scope of expertise but that in my experience the Nephrologists here have typical ways they're able to address virtually all complications that occur for patients on dialysis.  She and her aunt wish to discuss her concern about "the flow rate being too high" with the Nephrologist and I have encouraged him to do so tomorrow at the time of her next scheduled dialysis.  She reports ongoing migratory chest discomfort but denies current shortness of breath nausea vomiting or abdominal pain.  Assessment/Plan:  Severe volume overload due to noncompliance w/ HD Dialysis is the only effective treatment - unfortunately the patient's noncompliant behavior has not changed - I have explained to her again my fear that she will die soon if she does not comply with frequent long dialysis treatment periods  Large pericardial effusion As per above another sequelae of noncompliance with hemodialysis - echo did not reveal tamponade - the cardiologist rounding today is now suggesting pericardiocentesis may be indicated but is delaying this decision until tomorrow reportedly because the patient "needs to think about it"  ESRD w/ noncompliance w/ HD Nephrology is well aware of her concerns regarding natralyte, and has been working with her to determine if there is an alternative available - the patient is presently stating she will comply with dialysis as scheduled tomorrow and has again been informed of the absolute importance of completing the entire length of her treatment   Hyperkalemia due to noncompliance w/ HD Inadequately treated due to  noncompliance with dialysis  Acute on chronic diastolic congestive heart failure Volume management can only be accomplished through effective hemodialysis - she has not allowed this to happen to date  Elevated troponin - NSTEMI She refused planned cardiac cath 6/10 - this is the second time she has been advised to have cath and  refused on the day of the procedure (also March 2016) - cardiology continues to follow  Cirrhosis / chronic ascites  Ascites management per hemodialysis  HTN  Code Status: FULL Family Communication: Spoke with aunt and sister at bedside at length Disposition Plan: SDU  Consultants: Nephrology Cardiology   Procedures: None  Antibiotics: None  DVT prophylaxis: Subcutaneous heparin  Objective: Blood pressure 106/69, pulse 101, temperature 98.2 F (36.8 C), temperature source Oral, resp. rate 21, height 5' 9.5" (1.765 m), weight 85.3 kg (188 lb 0.8 oz), SpO2 98 %.  Intake/Output Summary (Last 24 hours) at 03/31/15 1327 Last data filed at 03/31/15 0831  Gross per 24 hour  Intake    600 ml  Output      0 ml  Net    600 ml   Exam: General: alert and oriented - conversant - no acute respiratory distress Lungs: Coarse crackles throughout all fields to include apices with no active wheeze Cardiovascular: Regular rate and rhythm without murmur or gallop - low grade rub is appreciable today Abdomen: Nontender, protuberant with suggestion of ascites, soft, bowel sounds positive, no rebound Extremities: No significant cyanosis, or clubbing;  2+edema bilateral lower extremities  Data Reviewed: Basic Metabolic Panel:  Recent Labs Lab 03/28/15 1450 03/29/15 0050 03/29/15 0541 03/30/15 0932 03/31/15 0303  NA 135  --  137 134* 133*  K 5.6*  --  5.1 5.1 5.1  CL 95*  --  93* 93* 92*  CO2 19*  --  24 19* 21*  GLUCOSE 101*  --  99 128* 92  BUN 131*  --  90* 116* 85*  CREATININE 16.34* 16.42* 12.55* 14.27* 11.49*  CALCIUM 7.3*  --  7.7* 7.6* 7.4*  PHOS  --   --   --  11.6* 10.4*    CBC:  Recent Labs Lab 03/28/15 1450 03/29/15 0050 03/29/15 0541 03/30/15 0932  WBC 11.8* 12.2* 11.9* 12.6*  HGB 9.8* 10.1* 10.6* 9.7*  HCT 30.0* 30.0* 32.9* 29.9*  MCV 85.2 83.3 84.6 83.8  PLT 242 258 246 278    Liver Function Tests:  Recent Labs Lab 03/30/15 0932 03/31/15 0303    AST 22  --   ALT 25  --   ALKPHOS 112  --   BILITOT 0.4  --   PROT 4.8*  --   ALBUMIN 1.4* 1.5*   Coags:  Recent Labs Lab 03/29/15 0541  INR 1.26   Cardiac Enzymes:  Recent Labs Lab 03/28/15 1450 03/29/15 0050 03/29/15 0541 03/29/15 1345  TROPONINI 1.13* 0.74* 0.73* 0.65*    Recent Results (from the past 240 hour(s))  MRSA PCR Screening     Status: None   Collection Time: 03/29/15 12:01 AM  Result Value Ref Range Status   MRSA by PCR NEGATIVE NEGATIVE Final    Comment:        The GeneXpert MRSA Assay (FDA approved for NASAL specimens only), is one component of a comprehensive MRSA colonization surveillance program. It is not intended to diagnose MRSA infection nor to guide or monitor treatment for MRSA infections.   Wound culture     Status: None (Preliminary result)   Collection Time: 03/29/15 12:50  AM  Result Value Ref Range Status   Specimen Description WOUND LEFT THIGH  Final   Special Requests NONE  Final   Gram Stain PENDING  Incomplete   Culture   Final    MULTIPLE ORGANISMS PRESENT, NONE PREDOMINANT Note: NO STAPHYLOCOCCUS AUREUS ISOLATED NO GROUP A STREP (S.PYOGENES) ISOLATED Performed at [facility]    Report Status PENDING  Incomplete  Culture, blood (routine x 2)     Status: None (Preliminary result)   Collection Time: 03/29/15 12:55 AM  Result Value Ref Range Status   Specimen Description BLOOD HEMODIALYSIS CATHETER  Final   Special Requests BOTTLES DRAWN AEROBIC AND ANAEROBIC 10CC  Final   Culture   Final           BLOOD CULTURE RECEIVED NO GROWTH TO DATE CULTURE WILL BE HELD FOR 5 DAYS BEFORE ISSUING A FINAL NEGATIVE REPORT Performed at [facility]    Report Status PENDING  Incomplete  Culture, blood (routine x 2)     Status: None (Preliminary result)   Collection Time: 03/29/15  1:45 AM  Result Value Ref Range Status   Specimen Description BLOOD HEMODIALYSIS CATHETER  Final   Special Requests BOTTLES DRAWN AEROBIC  AND ANAEROBIC 10CC  Final   Culture   Final           BLOOD CULTURE RECEIVED NO GROWTH TO DATE CULTURE WILL BE HELD FOR 5 DAYS BEFORE ISSUING A FINAL NEGATIVE REPORT Performed at [facility]    Report Status PENDING  Incomplete     Studies:   Recent x-ray studies have been reviewed in detail by the Attending Physician  Scheduled Meds:  Scheduled Meds: . aspirin EC  81 mg Oral Daily  . calcium acetate  667 mg Oral TID WC  . heparin subcutaneous  5,000 Units Subcutaneous 3 times per day  . multivitamin  1 tablet Oral QHS  . senna-docusate  2 tablet Oral BID    Time spent on care of this patient: 35 mins   Jossue Rubenstein T , MD   [facility] Office  [phone] Pager - Text Page per Loretha Stapler as per below:  On-Call/Text Page:      [email or web address]      password [email or web address]  If 7PM-7AM, please contact night-coverage [email or web address] Password [email or web address] 03/31/2015, 1:27 PM   LOS: 3 days

## 2015-04-01 ENCOUNTER — Inpatient Hospital Stay (HOSPITAL_COMMUNITY): Payer: Medicare Other | Admitting: Certified Registered Nurse Anesthetist

## 2015-04-01 ENCOUNTER — Encounter (HOSPITAL_COMMUNITY)
Admission: EM | Disposition: A | Discharge status: Home / Self Care | Payer: Self-pay | Source: Home / Self Care | Attending: Internal Medicine

## 2015-04-01 DIAGNOSIS — T82838A Hemorrhage of vascular prosthetic devices, implants and grafts, initial encounter: Secondary | ICD-10-CM

## 2015-04-01 DIAGNOSIS — F4323 Adjustment disorder with mixed anxiety and depressed mood: Secondary | ICD-10-CM | POA: Diagnosis present

## 2015-04-01 DIAGNOSIS — F4329 Adjustment disorder with other symptoms: Secondary | ICD-10-CM

## 2015-04-01 HISTORY — PX: REVISION OF ARTERIOVENOUS GORETEX GRAFT: SHX6073

## 2015-04-01 LAB — POCT I-STAT 4, (NA,K, GLUC, HGB,HCT)
GLUCOSE: 80 mg/dL (ref 65–99)
HEMATOCRIT: 33 % — AB (ref 36.0–46.0)
Hemoglobin: 11.2 g/dL — ABNORMAL LOW (ref 12.0–15.0)
POTASSIUM: 6 mmol/L — AB (ref 3.5–5.1)
Sodium: 128 mmol/L — ABNORMAL LOW (ref 135–145)

## 2015-04-01 SURGERY — REVISION OF ARTERIOVENOUS GORETEX GRAFT
Anesthesia: General | Site: Leg Upper | Laterality: Left

## 2015-04-01 MED ORDER — PHENYLEPHRINE HCL 10 MG/ML IJ SOLN
INTRAMUSCULAR | Status: DC | PRN
  Administered 2015-04-01 (×2): 80 ug via INTRAVENOUS

## 2015-04-01 MED ORDER — FENTANYL CITRATE (PF) 100 MCG/2ML IJ SOLN: 25.0000 ug | INTRAMUSCULAR | Status: DC | PRN

## 2015-04-01 MED ORDER — CEFAZOLIN SODIUM-DEXTROSE 2-3 GM-% IV SOLR
INTRAVENOUS | Status: DC | PRN
  Administered 2015-04-01: 2 g via INTRAVENOUS

## 2015-04-01 MED ORDER — PENTAFLUOROPROP-TETRAFLUOROETH EX AERO: 1.0000 "application " | INHALATION_SPRAY | CUTANEOUS | Status: DC | PRN

## 2015-04-01 MED ORDER — THROMBIN 20000 UNITS EX SOLR
CUTANEOUS | Status: AC
  Filled 2015-04-01: qty 20000

## 2015-04-01 MED ORDER — LIDOCAINE HCL (PF) 1 % IJ SOLN
INTRAMUSCULAR | Status: AC
  Filled 2015-04-01: qty 30

## 2015-04-01 MED ORDER — LIDOCAINE HCL (CARDIAC) 20 MG/ML IV SOLN
INTRAVENOUS | Status: AC
  Filled 2015-04-01: qty 5

## 2015-04-01 MED ORDER — PHENOL 1.4 % MT LIQD
1.0000 | OROMUCOSAL | Status: DC | PRN
  Filled 2015-04-01: qty 177

## 2015-04-01 MED ORDER — THROMBIN 20000 UNITS EX KIT
PACK | CUTANEOUS | Status: AC
  Filled 2015-04-01: qty 1

## 2015-04-01 MED ORDER — SODIUM CHLORIDE 0.9 % IV SOLN: 100.0000 mL | INTRAVENOUS | Status: DC | PRN

## 2015-04-01 MED ORDER — SODIUM CHLORIDE 0.9 % IJ SOLN
INTRAMUSCULAR | Status: AC
  Filled 2015-04-01: qty 10

## 2015-04-01 MED ORDER — CISATRACURIUM BESYLATE (PF) 10 MG/5ML IV SOLN
INTRAVENOUS | Status: DC | PRN
  Administered 2015-04-01: 12 mg via INTRAVENOUS

## 2015-04-01 MED ORDER — LIDOCAINE-PRILOCAINE 2.5-2.5 % EX CREA
1.0000 "application " | TOPICAL_CREAM | CUTANEOUS | Status: DC | PRN
  Filled 2015-04-01: qty 5

## 2015-04-01 MED ORDER — EPHEDRINE SULFATE 50 MG/ML IJ SOLN
INTRAMUSCULAR | Status: AC
  Filled 2015-04-01: qty 1

## 2015-04-01 MED ORDER — SODIUM CHLORIDE 0.9 % IV SOLN
INTRAVENOUS | Status: DC | PRN
  Administered 2015-04-01: 17:00:00 via INTRAVENOUS

## 2015-04-01 MED ORDER — MIDAZOLAM HCL 2 MG/2ML IJ SOLN
INTRAMUSCULAR | Status: AC
  Filled 2015-04-01: qty 2

## 2015-04-01 MED ORDER — LIDOCAINE HCL (PF) 1 % IJ SOLN: 5.0000 mL | INTRAMUSCULAR | Status: DC | PRN

## 2015-04-01 MED ORDER — HEPARIN SODIUM (PORCINE) 5000 UNIT/ML IJ SOLN
INTRAMUSCULAR | Status: DC | PRN
  Administered 2015-04-01: 17:00:00

## 2015-04-01 MED ORDER — NEOSTIGMINE METHYLSULFATE 10 MG/10ML IV SOLN
INTRAVENOUS | Status: DC | PRN
  Administered 2015-04-01: 3 mg via INTRAVENOUS

## 2015-04-01 MED ORDER — MEPERIDINE HCL 25 MG/ML IJ SOLN: 6.2500 mg | INTRAMUSCULAR | Status: DC | PRN

## 2015-04-01 MED ORDER — ONDANSETRON HCL 4 MG/2ML IJ SOLN
INTRAMUSCULAR | Status: AC
  Filled 2015-04-01: qty 2

## 2015-04-01 MED ORDER — GLYCOPYRROLATE 0.2 MG/ML IJ SOLN
INTRAMUSCULAR | Status: DC | PRN
  Administered 2015-04-01: 0.4 mg via INTRAVENOUS

## 2015-04-01 MED ORDER — PROPOFOL 10 MG/ML IV BOLUS
INTRAVENOUS | Status: DC | PRN
  Administered 2015-04-01: 150 mg via INTRAVENOUS

## 2015-04-01 MED ORDER — PHENYLEPHRINE 40 MCG/ML (10ML) SYRINGE FOR IV PUSH (FOR BLOOD PRESSURE SUPPORT)
PREFILLED_SYRINGE | INTRAVENOUS | Status: AC
  Filled 2015-04-01: qty 10

## 2015-04-01 MED ORDER — NEOSTIGMINE METHYLSULFATE 10 MG/10ML IV SOLN
INTRAVENOUS | Status: AC
  Filled 2015-04-01: qty 1

## 2015-04-01 MED ORDER — ROCURONIUM BROMIDE 50 MG/5ML IV SOLN
INTRAVENOUS | Status: AC
  Filled 2015-04-01: qty 1

## 2015-04-01 MED ORDER — GLYCOPYRROLATE 0.2 MG/ML IJ SOLN
INTRAMUSCULAR | Status: AC
  Filled 2015-04-01: qty 3

## 2015-04-01 MED ORDER — PROPOFOL 10 MG/ML IV BOLUS
INTRAVENOUS | Status: AC
  Filled 2015-04-01: qty 20

## 2015-04-01 MED ORDER — PROMETHAZINE HCL 25 MG/ML IJ SOLN: 6.2500 mg | INTRAMUSCULAR | Status: DC | PRN

## 2015-04-01 MED ORDER — SUCCINYLCHOLINE CHLORIDE 20 MG/ML IJ SOLN
INTRAMUSCULAR | Status: AC
  Filled 2015-04-01: qty 1

## 2015-04-01 MED ORDER — MIDAZOLAM HCL 2 MG/2ML IJ SOLN: 0.5000 mg | Freq: Once | INTRAMUSCULAR | Status: DC | PRN

## 2015-04-01 MED ORDER — PHENYLEPHRINE HCL 10 MG/ML IJ SOLN
10.0000 mg | INTRAVENOUS | Status: DC | PRN
  Administered 2015-04-01: 20 ug/min via INTRAVENOUS

## 2015-04-01 MED ORDER — 0.9 % SODIUM CHLORIDE (POUR BTL) OPTIME
TOPICAL | Status: DC | PRN
  Administered 2015-04-01: 2000 mL

## 2015-04-01 MED ORDER — NEPRO/CARBSTEADY PO LIQD
237.0000 mL | ORAL | Status: DC | PRN
  Filled 2015-04-01: qty 237

## 2015-04-01 MED ORDER — SODIUM CHLORIDE 0.9 % IV BOLUS (SEPSIS)
250.0000 mL | Freq: Once | INTRAVENOUS | Status: AC
  Administered 2015-04-01: 250 mL via INTRAVENOUS

## 2015-04-01 MED ORDER — ALTEPLASE 2 MG IJ SOLR
2.0000 mg | Freq: Once | INTRAMUSCULAR | Status: AC | PRN
  Filled 2015-04-01: qty 2

## 2015-04-01 MED ORDER — FENTANYL CITRATE (PF) 250 MCG/5ML IJ SOLN
INTRAMUSCULAR | Status: AC
  Filled 2015-04-01: qty 5

## 2015-04-01 MED ORDER — ONDANSETRON HCL 4 MG/2ML IJ SOLN
INTRAMUSCULAR | Status: DC | PRN
  Administered 2015-04-01: 4 mg via INTRAVENOUS

## 2015-04-01 MED ORDER — LIDOCAINE HCL (CARDIAC) 20 MG/ML IV SOLN
INTRAVENOUS | Status: DC | PRN
  Administered 2015-04-01: 40 mg via INTRAVENOUS

## 2015-04-01 MED ORDER — FENTANYL CITRATE (PF) 100 MCG/2ML IJ SOLN
INTRAMUSCULAR | Status: DC | PRN
  Administered 2015-04-01: 150 ug via INTRAVENOUS

## 2015-04-01 MED ORDER — KETOROLAC TROMETHAMINE 30 MG/ML IJ SOLN: 30.0000 mg | Freq: Once | INTRAMUSCULAR | Status: DC

## 2015-04-01 MED ORDER — THROMBIN 20000 UNITS EX SOLR
CUTANEOUS | Status: DC | PRN
  Administered 2015-04-01: 19:00:00 via TOPICAL

## 2015-04-01 MED ORDER — PROTAMINE SULFATE 10 MG/ML IV SOLN
INTRAVENOUS | Status: DC | PRN
  Administered 2015-04-01: 70 mg via INTRAVENOUS

## 2015-04-01 SURGICAL SUPPLY — 39 items
CANISTER SUCTION 2500CC (MISCELLANEOUS) ×2 IMPLANT
CANNULA VESSEL 3MM 2 BLNT TIP (CANNULA) ×2 IMPLANT
CLIP TI MEDIUM 6 (CLIP) ×2 IMPLANT
CLIP TI WIDE RED SMALL 6 (CLIP) ×2 IMPLANT
DECANTER SPIKE VIAL GLASS SM (MISCELLANEOUS) ×2 IMPLANT
DRAPE INCISE IOBAN 66X45 STRL (DRAPES) ×2 IMPLANT
DRAPE WARM FLUID 44X44 (DRAPE) ×2 IMPLANT
ELECT REM PT RETURN 9FT ADLT (ELECTROSURGICAL) ×2
ELECTRODE REM PT RTRN 9FT ADLT (ELECTROSURGICAL) ×1 IMPLANT
GAUZE SPONGE 4X4 12PLY STRL (GAUZE/BANDAGES/DRESSINGS) ×2 IMPLANT
GEL ULTRASOUND 20GR AQUASONIC (MISCELLANEOUS) IMPLANT
GLOVE BIO SURGEON STRL SZ7.5 (GLOVE) ×2 IMPLANT
GLOVE BIOGEL PI IND STRL 6 (GLOVE) ×1 IMPLANT
GLOVE BIOGEL PI IND STRL 6.5 (GLOVE) ×1 IMPLANT
GLOVE BIOGEL PI IND STRL 7.5 (GLOVE) ×1 IMPLANT
GLOVE BIOGEL PI INDICATOR 6 (GLOVE) ×1
GLOVE BIOGEL PI INDICATOR 6.5 (GLOVE) ×1
GLOVE BIOGEL PI INDICATOR 7.5 (GLOVE) ×1
GLOVE ECLIPSE 7.0 STRL STRAW (GLOVE) ×2 IMPLANT
GLOVE SURG SS PI 6.5 STRL IVOR (GLOVE) ×2 IMPLANT
GOWN STRL REUS W/ TWL LRG LVL3 (GOWN DISPOSABLE) ×3 IMPLANT
GOWN STRL REUS W/TWL LRG LVL3 (GOWN DISPOSABLE) ×3
GRAFT GORETEX STRT 4-7X45 (Vascular Products) ×2 IMPLANT
KIT BASIN OR (CUSTOM PROCEDURE TRAY) ×2 IMPLANT
KIT ROOM TURNOVER OR (KITS) ×2 IMPLANT
LIQUID BAND (GAUZE/BANDAGES/DRESSINGS) ×2 IMPLANT
LOOP VESSEL MINI RED (MISCELLANEOUS) IMPLANT
NEEDLE HYPO 25GX1X1/2 BEV (NEEDLE) IMPLANT
NS IRRIG 1000ML POUR BTL (IV SOLUTION) ×2 IMPLANT
PACK CV ACCESS (CUSTOM PROCEDURE TRAY) ×2 IMPLANT
PAD ARMBOARD 7.5X6 YLW CONV (MISCELLANEOUS) ×4 IMPLANT
SPONGE SURGIFOAM ABS GEL 100 (HEMOSTASIS) IMPLANT
SUT PROLENE 6 0 CC (SUTURE) ×12 IMPLANT
SUT VIC AB 3-0 SH 27 (SUTURE) ×2
SUT VIC AB 3-0 SH 27X BRD (SUTURE) ×2 IMPLANT
SUT VICRYL 4-0 PS2 18IN ABS (SUTURE) ×4 IMPLANT
TAPE CLOTH SURG 4X10 WHT LF (GAUZE/BANDAGES/DRESSINGS) ×2 IMPLANT
UNDERPAD 30X30 INCONTINENT (UNDERPADS AND DIAPERS) ×2 IMPLANT
WATER STERILE IRR 1000ML POUR (IV SOLUTION) ×2 IMPLANT

## 2015-04-01 NOTE — Progress Notes (Signed)
Ms. Latvala is lying in bed and telling me she is motivated to do HD today and begin to get fluid removed. She tells me that they came to get her this morning but she wanted clearance from her doctors to proceed with UF before going to HD today. She appears motivated. She says that her discomfort with dialysis is chest pain, nausea, dizziness and "drop in BP." She also expresses desire to complete Advance Directive but does not wish to complete until after HD - offered assistance in thinking and choices.   Yong Channel, NP Palliative Medicine Team Pager # [phone] (M-F 8a-5p) Team Phone # [phone] (Nights/Weekends)

## 2015-04-01 NOTE — Anesthesia Postprocedure Evaluation (Signed)
  Anesthesia Post-op Note  Patient: Holly Mendoza  Procedure(s) Performed: Procedure(s): REVISION OF ARTERIOVENOUS GORETEX GRAFT (Left)  Patient Location: PACU  Anesthesia Type: No value filed.   Level of Consciousness: awake, alert  and oriented  Airway and Oxygen Therapy: Patient Spontanous Breathing  Post-op Pain: mild  Post-op Assessment: Post-op Vital signs reviewed  Post-op Vital Signs: Reviewed  Last Vitals:  Filed Vitals:   04/01/15 2045  BP: 98/56  Pulse:   Temp: 36.6 C  Resp: 18    Complications: No apparent anesthesia complications

## 2015-04-01 NOTE — Consult Note (Signed)
[facility] Consult   Reason for Consult:  Competency evaluation Referring Physician:  Dr. Thereasa Solo Patient Identification: Holly Mendoza MRN:  [id number] Principal Diagnosis: Acute on chronic diastolic CHF (congestive heart failure), NYHA class 4 Diagnosis:   Patient Active Problem List   Diagnosis Date Noted  . Elevated troponin I level [R79.89]   . Noncompliance [Z91.19]   . Pericardial effusion [I31.9]   . Palliative care encounter [Z51.5]   . Chest pain with high risk for cardiac etiology [R07.9] 03/28/2015  . Acute on chronic diastolic CHF (congestive heart failure), NYHA class 4 [I50.33] 03/28/2015  . ESRD (end stage renal disease) [N18.6] 03/28/2015  . Troponin level elevated [R79.89] 01/13/2015  . Chest pain at rest [R07.9] 01/13/2015  . Hyperkalemia [E87.5] 12/19/2014  . Abdominal pain [R10.9] 12/19/2014  . Anemia of renal disease [D63.1] 08/31/2014  . Acute pulmonary edema [J81.0]   . Ascites [R18.8] 07/11/2014  . PAH (pulmonary arterial hypertension) with portal hypertension [I27.9] 07/03/2014  . Volume overload [E87.70] 05/11/2014  . Pulmonary edema [J81.1] 05/07/2014  . ESRD (end stage renal disease) on dialysis [N18.6, Z99.2] 05/03/2014  . HTN (hypertension) [I10] 04/24/2014  . Prolonged Q-T interval on ECG [I45.81] 04/24/2014  . Hx of ascites [Z87.898] 04/20/2014    Total Time spent with patient: 45 minutes  Subjective:   Holly Mendoza is a 44 y.o. female patient admitted with CHF, ESRD and chest pain on resting.  HPI: Holly Mendoza is a 44 years old single African-American female who is on disability over 20 years with the chronic medical conditions admitted to [facility] for treatment of end-stage renal failure with hemodialysis and congestive heart failure with pericardial effusion. Patient is also has cirrhosis of liver on clear etiology. Patient reported she has been living by herself in an apartment in [place]. Patient  denied symptoms of depression, anxiety, psychosis, suicidal/homicidal ideation intention or plan. Patient has comprehensive understanding about her current medical conditions and required treatment. Patient also able to communicate that she is not able to tolerate her hemodialysis after two hours at a time and 2 times a week even though nephrologist recommended 4 hours 3 times a week. Patient reportedly complaining that the process is very very painful. Patient stated that she is looking for alternate methods of receiving hemodialysis and reportedly wrote a letter to hemodialysis companies asking for alternative. Patient has intact cognitions including orientation, concentration, memory and language functions. Patient has previous psychiatric/psychological assessment before transplantation of kidney in the past. Patient denied drug of abuse and alcoholism. Patient has mother who has been in touch with her recently and aunt who has been caring for her over the years, both of them are at bedside and supportive to the patient.   Past Medical History:  Past Medical History  Diagnosis Date  . 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 Hypertension   . ESRD on hemodialysis 04/20/2014    Patient started HD in 1998.  She has been dialyzed in [place] at "[facility]" until moving to [place] in July 2015 to live with family.  She has had failed accesses in the L arm.  No attempt to place access was made in the R arm, patient is not sure why.  She has a L thigh AVG which she says has been functional for 7-8 years.  They stopped giving her heparin several years ago due to prolonged access bleeding.    Marland Kitchen Heart murmur   .  Anemia of chronic disease   . History of blood transfusion     "several; w/transplant, infections, etc"  . Headache(784.0)     "often; sometimes daily" (04/26/2014)  . Migraines     "qod" (04/26/2014)  . ESRD on hemodialysis 04/20/2014    Patient started HD in  1998.  She has been dialyzed in [place] at "[facility]" until moving to [place] in July 2015 to live with family.  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  She has had failed accesses in the L arm.  No attempt to place access was made in the R arm, patient is not sure why.  She has a L thigh AVG which she says has been functional for  . Ascites   . Pulmonary hypertension   . Ascites   . Cirrhosis     Past Surgical History  Procedure Laterality Date  . Arteriovenous graft placement Left 2013    thigh  . 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 Thoracentesis  last done ~ 2 weeks ago  . Thrombectomy and revision of arterioventous (av) goretex  graft Left 01/08/2015    Procedure: THROMBECTOMY AND REVISION OF ARTERIOVENTOUS (AV) THIGH GORETEX  GRAFT;  Surgeon: Angelia Mould, MD;  Location: [facility] OR;  Service: Vascular;  Laterality: Left;   Family History:  Family History  Problem Relation Age of Onset  . Healthy Mother   . Liver disease Maternal Uncle     drinker  . Asthma Maternal Grandmother    Social History:  History  Alcohol Use No     History  Drug Use No    History   Social History  . Marital Status: Single    Spouse Name: N/A  . Number of Children: 0  . Years of Education: N/A   Occupational History  . Umemployed    Social History Main Topics  . Smoking status: Never Smoker   . Smokeless tobacco: Never Used  . Alcohol Use: No  . Drug Use: No  . Sexual Activity: No   Other Topics Concern  . None   Social History Narrative   Lives in [place] with Holly Mendoza. Moved from [place] in July 2015.    Additional Social History:                          Allergies:   Allergies  Allergen Reactions  . Contrast Media [Iodinated Diagnostic Agents] Anaphylaxis  . Procardia [Nifedipine] Other (See Comments)    Sweating, BP drops dangerously low,  . Vancomycin  Anaphylaxis and Hives    Labs:  Results for orders placed or performed during the hospital encounter of 03/28/15 (from the past 48 hour(s))  Renal function panel     Status: Abnormal   Collection Time: 03/31/15  3:03 AM  Result Value Ref Range   Sodium 133 (L) 135 - 145 mmol/L   Potassium 5.1 3.5 - 5.1 mmol/L   Chloride 92 (L) 101 - 111 mmol/L   CO2 21 (L) 22 - 32 mmol/L   Glucose, Bld 92 65 - 99 mg/dL   BUN 85 (H) 6 - 20 mg/dL   Creatinine, Ser 11.49 (H) 0.44 - 1.00 mg/dL   Calcium 7.4 (L) 8.9 - 10.3 mg/dL   Phosphorus 10.4 (H) 2.5 - 4.6 mg/dL   Albumin 1.5 (L) 3.5 - 5.0 g/dL  GFR calc non Af Amer 4 (L) >60 mL/min   GFR calc Af Amer 4 (L) >60 mL/min    Comment: (NOTE) The eGFR has been calculated using the CKD EPI equation. This calculation has not been validated in all clinical situations. eGFR's persistently <60 mL/min signify possible Chronic Kidney Disease.    Anion gap 20 (H) 5 - 15    Vitals: Blood pressure 100/69, pulse 97, temperature 98.2 F (36.8 C), temperature source Oral, resp. rate 23, height 5' 9.5" (1.765 m), weight 85.3 kg (188 lb 0.8 oz), SpO2 95 %.  Risk to Self: Is patient at risk for suicide?: No Risk to Others:   Prior Inpatient Therapy:   Prior Outpatient Therapy:    Current Facility-Administered Medications  Medication Dose Route Frequency Provider Last Rate Last Dose  . aspirin EC tablet 81 mg  81 mg Oral Daily Florencia Reasons, MD   81 mg at 03/31/15 1000  . calcium acetate (PHOSLO) capsule 667 mg  667 mg Oral TID WC Florencia Reasons, MD   667 mg at 04/01/15 0800  . fentaNYL (SUBLIMAZE) injection 25 mcg  25 mcg Intravenous Q1H PRN Cherene Altes, MD   25 mcg at 04/01/15 0754  . heparin injection 5,000 Units  5,000 Units Subcutaneous 3 times per day Florencia Reasons, MD   5,000 Units at 03/28/15 1904  . multivitamin (RENA-VIT) tablet 1 tablet  1 tablet Oral QHS Mauricia Area, MD   1 tablet at 03/31/15 2259  . senna-docusate (Senokot-S) tablet 2 tablet  2 tablet Oral  BID Florencia Reasons, MD   2 tablet at 03/30/15 2202  . zolpidem (AMBIEN) tablet 5 mg  5 mg Oral QHS PRN Florencia Reasons, MD   5 mg at 03/31/15 2300    Musculoskeletal: Strength & Muscle Tone: decreased Gait & Station: unable to stand Patient leans: N/A  Psychiatric Specialty Exam: Physical Exam  ROS  Blood pressure 100/69, pulse 97, temperature 98.2 F (36.8 C), temperature source Oral, resp. rate 23, height 5' 9.5" (1.765 m), weight 85.3 kg (188 lb 0.8 oz), SpO2 95 %.Body mass index is 27.38 kg/(m^2).  General Appearance: Casual  Eye Contact::  Good  Speech:  Slow  Volume:  Decreased  Mood:  Euthymic  Affect:  Constricted and Depressed  Thought Process:  Coherent and Goal Directed  Orientation:  Full (Time, Place, and Person)  Thought Content:  WDL  Suicidal Thoughts:  No  Homicidal Thoughts:  No  Memory:  Immediate;   Fair Recent;   Fair  Judgement:  Impaired  Insight:  Fair  Psychomotor Activity:  Decreased  Concentration:  Good  Recall:  Good  Fund of Knowledge:Good  Language: Good  Akathisia:  Negative  Handed:  Right  AIMS (if indicated):     Assets:  Communication Skills Desire for Improvement Financial Resources/Insurance Housing Intimacy Resilience Social Support  ADL's:  Impaired  Cognition: WNL  Sleep:      Medical Decision Making: Review of Psycho-Social Stressors (1), Review or order clinical lab tests (1), New Problem, with no additional work-up planned (3), Review of Last Therapy Session (1), Review or order medicine tests (1), Review of Medication Regimen & Side Effects (2) and Review of New Medication or Change in Dosage (2)  Treatment Plan Summary: Patient denies symptoms of depression anxiety and psychosis. Patient has intact cognitions and understanding about her complicated medical problems and required treatment. Daily contact with patient to assess and evaluate symptoms and progress in treatment and Medication management  Plan:  Patient meets criteria for  capacity to make her own medical decisions and living arrangements based on my evaluation today. Patient does not meet criteria for psychiatric inpatient admission. Supportive therapy provided about ongoing stressors.  Disposition: Patient will be discharged to the outpatient medication management and medically stable.  Laporshia Hogen,JANARDHAHA R. 04/01/2015 9:32 AM

## 2015-04-01 NOTE — Consult Note (Signed)
[facility] HISTORY AND PHYSICAL   History of Present Illness:  Patient is a 44 y.o. year old female who presents for evaluation of bleeding left thigh AV graft.  Pt with recent history of revision of left thigh graft by Dr Edilia Bo in March 2016.  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  The patient began to have bleeding from her HD graft on dialysis today.  Other medical problems include hypertension, anemia, cirrhosis with chronic ascites, pulmonary hypertension.  She also has history of a pericardial effusion.  She has been hemodynamically stable.  Dialysis was stopped today due to bleeding.  Past Medical History  Diagnosis Date  . 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 Hypertension   . ESRD on hemodialysis 04/20/2014    Patient started HD in 1998.  She has been dialyzed in [place] at "[facility]" until moving to [place] in July 2015 to live with family.  She has had failed accesses in the L arm.  No attempt to place access was made in the R arm, patient is not sure why.  She has a L thigh AVG which she says has been functional for 7-8 years.  They stopped giving her heparin several years ago due to prolonged access bleeding.    Marland Kitchen Heart murmur   . Anemia of chronic disease   . History of blood transfusion     "several; w/transplant, infections, etc"  . Headache(784.0)     "often; sometimes daily" (04/26/2014)  . Migraines     "qod" (04/26/2014)  . ESRD on hemodialysis 04/20/2014    Patient started HD in 1998.  She has been dialyzed in [place] at "[facility]" until moving to [place] in July 2015 to live with family.  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  She has had failed accesses in the L arm.  No attempt to place access was made in the R arm, patient is not sure why.  She has a L thigh AVG which she says has been  functional for  . Ascites   . Pulmonary hypertension   . Ascites   . Cirrhosis     Past Surgical History  Procedure Laterality Date  . Arteriovenous graft placement Left 2013    thigh  . 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 Thoracentesis  last done ~ 2 weeks ago  . Thrombectomy and revision of arterioventous (av) goretex  graft Left 01/08/2015    Procedure: THROMBECTOMY AND REVISION OF ARTERIOVENTOUS (AV) THIGH GORETEX  GRAFT;  Surgeon: Chuck Hint, MD;  Location: [facility] OR;  Service: Vascular;  Laterality: Left;    Social History History  Substance Use Topics  . Smoking status: Never Smoker   . Smokeless tobacco: Never Used  . Alcohol Use: No    Family History Family History  Problem Relation Age of Onset  . Healthy Mother   . Liver disease Maternal Uncle     drinker  . Asthma Maternal Grandmother     Allergies  Allergies  Allergen Reactions  . Contrast Media [Iodinated Diagnostic Agents] Anaphylaxis  . Procardia [Nifedipine] Other (See Comments)    Sweating, BP drops dangerously low,  . Vancomycin Anaphylaxis and Hives     Current Facility-Administered Medications  Medication Dose Route Frequency Provider Last Rate Last Dose  . calcium acetate (PHOSLO) capsule 667 mg  667 mg Oral TID WC Albertine Grates, MD   667 mg at 04/01/15 0800  . fentaNYL (SUBLIMAZE) injection 25 mcg  25 mcg Intravenous Q1H PRN Lonia Blood, MD   25 mcg at 04/01/15 1154  . multivitamin (RENA-VIT) tablet 1 tablet  1 tablet Oral QHS Beryle Lathe, MD   1 tablet at 03/31/15 2259  . senna-docusate (Senokot-S) tablet 2 tablet  2 tablet Oral BID Albertine Grates, MD   2 tablet at 03/30/15 2202  . zolpidem (AMBIEN) tablet 5 mg  5 mg Oral QHS PRN Albertine Grates, MD   5 mg at 03/31/15 2300    ROS:   General:  No weight loss, Fever, chills  HEENT: No recent headaches, no nasal bleeding, no visual changes, no sore throat  Neurologic: No dizziness,  blackouts, seizures. No recent symptoms of stroke or mini- stroke. No recent episodes of slurred speech, or temporary blindness.  Cardiac: No recent episodes of chest pain/pressure, no shortness of breath at rest.  + shortness of breath with exertion.  Denies history of atrial fibrillation or irregular heartbeat  Vascular: No history of rest pain in feet.  No history of claudication.  No history of non-healing ulcer, No history of DVT   Pulmonary: No home oxygen, no productive cough, no hemoptysis,  No asthma or wheezing  Musculoskeletal:   Arthritis,  Low back pain,   Joint pain  Hematologic:No history of hypercoagulable state.  No history of easy bleeding.  +history of anemia  Gastrointestinal: No hematochezia or melena,  No gastroesophageal reflux, no trouble swallowing  Urinary:  chronic Kidney disease, [ x] on HD -  MWF or  TTHS,  Burning with urination,  Frequent urination,  Difficulty urinating;   Skin: No rashes  Psychological: No history of anxiety,  No history of depression   Physical Examination  Filed Vitals:   04/01/15 1201 04/01/15 1419 04/01/15 1435 04/01/15 1440  BP: 105/55 111/74  102/74  Pulse: 48 108 117 120  Temp: 97.4 F (36.3 C) 98.4 F (36.9 C)    TempSrc: Oral Oral    Resp: Height:      Weight:      SpO2: 89%       Body mass index is 27.38 kg/(m^2).  General:  Alert and oriented, no acute distress HEENT: Normal Pulmonary: Clear to auscultation bilaterally Cardiac: Regular Rate and Rhythm  Abdomen: Soft, non-tender, distended Skin: No rash Extremity Pulses:  Left thigh av graft with ulceration over graft approximately 3 oclock position currently no active bleeding Musculoskeletal: No deformity or edema  Neurologic: Upper and lower extremity motor 5/5 and symmetric  DATA:   CBC    Component Value Date/Time   WBC 12.6* 03/30/2015 0932   RBC 3.57* 03/30/2015 0932   HGB 9.7* 03/30/2015 0932   HCT 29.9*  03/30/2015 0932   PLT 278 03/30/2015 0932   MCV 83.8 03/30/2015 0932   MCH 27.2 03/30/2015 0932   MCHC 32.4 03/30/2015 0932   RDW 14.7 03/30/2015 0932   LYMPHSABS 1.1 01/13/2015 0150   MONOABS 1.2* 01/13/2015 0150   EOSABS 0.2 01/13/2015 0150   BASOSABS 0.0 01/13/2015 0150    BMET    Component Value Date/Time   NA 133* 03/31/2015 0303   K 5.1 03/31/2015 0303   CL 92* 03/31/2015 0303  CO2 21* 03/31/2015 0303   GLUCOSE 92 03/31/2015 0303   BUN 85* 03/31/2015 0303   CREATININE 11.49* 03/31/2015 0303   CALCIUM 7.4* 03/31/2015 0303   GFRNONAA 4* 03/31/2015 0303   GFRAA 4* 03/31/2015 0303       ASSESSMENT:  Bleeding left thigh graft with ulceration over graft.  High risk for recurrent bleeding   PLAN:  To OR emergently for graft revision  Fabienne Bruns, MD [facility] Office: [phone] Pager: [phone]

## 2015-04-01 NOTE — Progress Notes (Signed)
[facility] TEAM 1 - Stepdown/ICU TEAM Progress Note  Holly Mendoza UTM:[id number] DOB: [birth year] DOA: 03/28/2015 PCP: Dorrene German, MD  Admit HPI / Brief Narrative: 44 y.o. 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 Patient reported progressively worsening shortness of breath over a week.  She was given levaquin for possible pna but reported she did not take it.  She reported constant chest pain for a week, and worsening of chronic ascites.  She reported miss her last dialysis.  In the ED course she was found to be markedly fluid overloaded, elevation of troponin, mild elevation of potassium, and EKG with ? Mobitz I with QT prolongation of 520.  Nephro was consulted, and patient initially agreed with emergent dialysis.  She did not wish to have paracentesis done, She did not wish to have aggressive cardiac intervention however desired to remain full code.  HPI/Subjective: After discussion w/ the Nephrologist, Holly Mendoza has agreed to a HD tx regimen.  She is seen on the HD unit today.  She states she is tolerating it well and denies CP, N/V, abdom pain, or sob.    Assessment/Plan:  Severe volume overload due to noncompliance w/ HD Dialysis is the only effective treatment - cont to offer HD as per Nephrology plan   Large pericardial effusion another sequelae of noncompliance with hemodialysis - echo did not reveal tamponade but did note large effusion - the plan at this time is to follow the effusion w/ ongoing HD tx  ESRD w/ noncompliance w/ HD Nephrology is well aware of her concerns regarding natralyte, and has been working with her to determine if there is an alternative available - the patient is presently stating she will comply with dialysis - Psych has seen her today and states she has  capacity/is competent to make her own decisions   Hyperkalemia due to noncompliance w/ HD Inadequately treated due to noncompliance with dialysis  Acute on chronic diastolic congestive heart failure Volume management can only be accomplished through effective hemodialysis  Elevated troponin - NSTEMI She refused planned cardiac cath 6/10 - this is the second time she has been advised to have cath and refused on the day of the procedure (also March 2016) - Cardiology continues to follow  Cirrhosis / chronic ascites  Ascites management per hemodialysis  HTN  Code Status: FULL Family Communication: no family present at time of exam today as pt seen in HD  Disposition Plan: SDU  Consultants: Nephrology Cardiology   Procedures: None  Antibiotics: None  DVT prophylaxis: Subcutaneous heparin  Objective: Blood pressure 105/55, pulse 48, temperature 97.4 F (36.3 C), temperature source Oral, resp. rate 21, height 5' 9.5" (1.765 m), weight 85.3 kg (188 lb 0.8 oz), SpO2 89 %.  Intake/Output Summary (Last 24 hours) at 04/01/15 1407 Last data filed at 04/01/15 0845  Gross per 24 hour  Intake    480 ml  Output      0 ml  Net    480 ml   Exam: General: alert and oriented - conversant - some modest facial edema noted today  Lungs: Coarse crackles throughout all fields to include apices - no active wheeze Cardiovascular: Regular rate and rhythm without murmur or gallop - no rub noted today  Abdomen: Nontender, protuberant with suggestion of ascites, soft, bowel sounds positive,  no rebound Extremities: No significant cyanosis, or clubbing;  2+edema bilateral lower extremities persists   Data Reviewed: Basic Metabolic Panel:  Recent Labs Lab 03/28/15 1450 03/29/15 0050 03/29/15 0541 03/30/15 0932 03/31/15 0303  NA 135  --  137 134* 133*  K 5.6*  --  5.1 5.1 5.1  CL 95*  --  93* 93* 92*  CO2 19*  --  24 19* 21*  GLUCOSE 101*  --  99 128* 92  BUN 131*  --  90* 116* 85*    CREATININE 16.34* 16.42* 12.55* 14.27* 11.49*  CALCIUM 7.3*  --  7.7* 7.6* 7.4*  PHOS  --   --   --  11.6* 10.4*    CBC:  Recent Labs Lab 03/28/15 1450 03/29/15 0050 03/29/15 0541 03/30/15 0932  WBC 11.8* 12.2* 11.9* 12.6*  HGB 9.8* 10.1* 10.6* 9.7*  HCT 30.0* 30.0* 32.9* 29.9*  MCV 85.2 83.3 84.6 83.8  PLT 242 258 246 278    Liver Function Tests:  Recent Labs Lab 03/30/15 0932 03/31/15 0303  AST 22  --   ALT 25  --   ALKPHOS 112  --   BILITOT 0.4  --   PROT 4.8*  --   ALBUMIN 1.4* 1.5*   Coags:  Recent Labs Lab 03/29/15 0541  INR 1.26   Cardiac Enzymes:  Recent Labs Lab 03/28/15 1450 03/29/15 0050 03/29/15 0541 03/29/15 1345  TROPONINI 1.13* 0.74* 0.73* 0.65*    Recent Results (from the past 240 hour(s))  MRSA PCR Screening     Status: None   Collection Time: 03/29/15 12:01 AM  Result Value Ref Range Status   MRSA by PCR NEGATIVE NEGATIVE Final    Comment:        The GeneXpert MRSA Assay (FDA approved for NASAL specimens only), is one component of a comprehensive MRSA colonization surveillance program. It is not intended to diagnose MRSA infection nor to guide or monitor treatment for MRSA infections.   Wound culture     Status: None   Collection Time: 03/29/15 12:50 AM  Result Value Ref Range Status   Specimen Description WOUND LEFT THIGH  Final   Special Requests NONE  Final   Gram Stain   Final    NO WBC SEEN NO SQUAMOUS EPITHELIAL CELLS SEEN NO ORGANISMS SEEN Performed at [facility]    Culture   Final    MULTIPLE ORGANISMS PRESENT, NONE PREDOMINANT Note: NO STAPHYLOCOCCUS AUREUS ISOLATED NO GROUP A STREP (S.PYOGENES) ISOLATED Performed at [facility]    Report Status 03/31/2015 FINAL  Final  Culture, blood (routine x 2)     Status: None (Preliminary result)   Collection Time: 03/29/15 12:55 AM  Result Value Ref Range Status   Specimen Description BLOOD HEMODIALYSIS CATHETER  Final   Special Requests  BOTTLES DRAWN AEROBIC AND ANAEROBIC 10CC  Final   Culture   Final           BLOOD CULTURE RECEIVED NO GROWTH TO DATE CULTURE WILL BE HELD FOR 5 DAYS BEFORE ISSUING A FINAL NEGATIVE REPORT Performed at [facility]    Report Status PENDING  Incomplete  Culture, blood (routine x 2)     Status: None (Preliminary result)   Collection Time: 03/29/15  1:45 AM  Result Value Ref Range Status   Specimen Description BLOOD HEMODIALYSIS CATHETER  Final   Special Requests BOTTLES DRAWN AEROBIC AND ANAEROBIC 10CC  Final   Culture   Final  BLOOD CULTURE RECEIVED NO GROWTH TO DATE CULTURE WILL BE HELD FOR 5 DAYS BEFORE ISSUING A FINAL NEGATIVE REPORT Performed at [facility]    Report Status PENDING  Incomplete     Studies:   Recent x-ray studies have been reviewed in detail by the Attending Physician  Scheduled Meds:  Scheduled Meds: . aspirin EC  81 mg Oral Daily  . calcium acetate  667 mg Oral TID WC  . heparin subcutaneous  5,000 Units Subcutaneous 3 times per day  . multivitamin  1 tablet Oral QHS  . senna-docusate  2 tablet Oral BID    Time spent on care of this patient: 25 mins   [facility] T , MD   [facility] Office  [phone] Pager - Text Page per Loretha Stapler as per below:  On-Call/Text Page:      [email or web address]      password [email or web address]  If 7PM-7AM, please contact night-coverage [email or web address] Password [email or web address] 04/01/2015, 2:07 PM   LOS: 4 days

## 2015-04-01 NOTE — Anesthesia Procedure Notes (Signed)
Procedure Name: Intubation Date/Time: 04/01/2015 6:26 PM Performed by: Daiva Eves Pre-anesthesia Checklist: Patient identified, Timeout performed, Emergency Drugs available, Suction available and Patient being monitored Patient Re-evaluated:Patient Re-evaluated prior to inductionOxygen Delivery Method: Circle system utilized Preoxygenation: Pre-oxygenation with 100% oxygen Intubation Type: IV induction Ventilation: Mask ventilation without difficulty Laryngoscope Size: Mac and 3 Grade View: Grade I Tube type: Oral Tube size: 7.5 mm Number of attempts: 1 Airway Equipment and Method: Stylet Placement Confirmation: ETT inserted through vocal cords under direct vision,  breath sounds checked- equal and bilateral,  positive ETCO2 and CO2 detector Secured at: 22 cm Tube secured with: Tape Dental Injury: Teeth and Oropharynx as per pre-operative assessment

## 2015-04-01 NOTE — Progress Notes (Signed)
[facility] Progress Note  Assessment/Plan: 1. SOB/- adm CXR showed excess pulmonary edema and bilateral efffusion- needs serial HD 2. ESRD - with hyperkalemia 5.6 and uremia (BUN 130) on admission -  - due to underdialysis related to failure to attend HD and also stay on treatments. Hgb down to 85 on Sunday after Sat HD - really needs DAILY HD as long as here; have reduced BFR/DFR to minimize any dsequilibrium - time 3 hours; orders written for HD again on Tuesday  Patient begging [place] to let her do UF only to try and get vol down to avoid paracentesis/ pericardiocentesis. She says this worked for her in a [place] hospital to avoid paracentesis.  3. Hypertension/volume - hard to know true edw due to severe ascites 4. Anemia - hold on ESA for now; HGb ok at 9.7 - has refused ESA at times in the past 5. Metabolic bone disease - Hold on Hectorol for now 6. Nutrition - low alb - poor nutrition - uremia contributed to nause and poor intake 7. Ascites, chronic/ recurrent - secondary to CHF/pul HTN only made worse by years of inadequate HD   8. Goals of care - palliative care consult; seen by outpt Hospice MD last week - has advanced directives to complete - wants full code;    9.   Medical and dialysis noncompliance - dialyzes at the most 3 hours a week feels she is allergic/intolerant to Naturalyte dialysis- many symptoms she has are the same as uremia  10.  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  Will plan isolated UF with HD today and alternate with regular HD every other day for the next few days.  Then we can reassess ascites/ pericardial effusion to see if any progress has been made.    Vinson Moselle MD pager [phone]    cell [phone] 04/01/2015,  2:07 PM     Subjective:   No c/o - just feels so full of fluid doesn't want to eat, Wants UF only  Objective Filed Vitals:   03/31/15 2344 04/01/15 0327 04/01/15 0845 04/01/15 1201  BP: 112/73 101/68 100/69   Pulse: 97 97    Temp: 97.2 F (36.2 C) 97.8 F (36.6 C) 98.2 F (36.8 C) 97.4 F (36.3 C)  TempSrc: Axillary Oral Oral Oral  Resp: 22 21 23    Height:      Weight:      SpO2: 97% 95% 95%    Physical Exam General: ill appearing Heart: tachy regular 120s no rub heard Lungs: bilateral crackles Abdomen: marked distension ascites Extremities: ++ LE R> L may be due to lying on right side Dialysis Access: left thigh AVGG + bruit  Dialysis Orders: [facility] MWF 4 hr- rarely runs > 1.5 hours 1 K 2.5 Ca left thigh AVGG NO heparin Refuses Meds most of the time, EDW 72.5  Additional Objective Labs: Basic Metabolic Panel:  Recent Labs Lab 03/29/15 0541 03/30/15 0932 03/31/15 0303  NA 137 134* 133*  K 5.1 5.1 5.1  CL 93* 93* 92*  CO2 24 19* 21*  GLUCOSE 99 128* 92  BUN 90* 116* 85*  CREATININE 12.55* 14.27* 11.49*  CALCIUM 7.7* 7.6* 7.4*  PHOS  --  11.6* 10.4*   Liver Function  Tests:  Recent Labs Lab 03/30/15 0932 03/31/15 0303  AST 22  --   ALT 25  --   ALKPHOS 112  --   BILITOT 0.4  --   PROT 4.8*  --   ALBUMIN 1.4* 1.5*   CBC:  Recent Labs Lab 03/28/15 1450 03/29/15 0050 03/29/15 0541 03/30/15 0932  WBC 11.8* 12.2* 11.9* 12.6*  HGB 9.8* 10.1* 10.6* 9.7*  HCT 30.0* 30.0* 32.9* 29.9*  MCV 85.2 83.3 84.6 83.8  PLT 242 258 246 278   Blood Culture    Component Value Date/Time   SDES BLOOD HEMODIALYSIS CATHETER 03/29/2015 0145   SPECREQUEST BOTTLES DRAWN AEROBIC AND ANAEROBIC 10CC 03/29/2015 0145   CULT  03/29/2015 0145           BLOOD CULTURE RECEIVED NO GROWTH TO DATE CULTURE WILL BE HELD FOR 5 DAYS BEFORE ISSUING A FINAL NEGATIVE REPORT Performed at [facility]    REPTSTATUS PENDING 03/29/2015 0145    Cardiac Enzymes:  Recent  Labs Lab 03/28/15 1450 03/29/15 0050 03/29/15 0541 03/29/15 1345  TROPONINI 1.13* 0.74* 0.73* 0.65*  Medications:   . aspirin EC  81 mg Oral Daily  . calcium acetate  667 mg Oral TID WC  . heparin subcutaneous  5,000 Units Subcutaneous 3 times per day  . multivitamin  1 tablet Oral QHS  . senna-docusate  2 tablet Oral BID

## 2015-04-01 NOTE — Progress Notes (Signed)
Medicare Important Message given? YES (If response is "NO", the following Medicare IM given date fields will be blank) Date Medicare IM given:04/01/15 Medicare IM given by: Whitnie Deleon 

## 2015-04-01 NOTE — Anesthesia Preprocedure Evaluation (Addendum)
Anesthesia Evaluation  Patient identified by MRN, date of birth, ID band Patient awake    Reviewed: Allergy & Precautions, NPO status , Patient's Chart, lab work & pertinent test results  History of Anesthesia Complications Negative for: history of anesthetic complications  Airway Mallampati: II  TM Distance: >3 FB Neck ROM: Full    Dental  (+) Dental Advisory Given, Missing   Pulmonary neg pulmonary ROS,  breath sounds clear to auscultation        Cardiovascular hypertension, Pt. on medications - anginaRhythm:Regular Rate:Normal  3/16 ECHO: EF 65-70%, trivial AI, mild MR, severe TR   Neuro/Psych negative neurological ROS     GI/Hepatic Neg liver ROS, GERD-  Medicated and Controlled,Ascites   Endo/Other  negative endocrine ROS  Renal/GU ESRF and DialysisRenal disease (K+ 6.0)     Musculoskeletal   Abdominal   Peds  Hematology  (+) Blood dyscrasia (Hb 11), ,   Anesthesia Other Findings   Reproductive/Obstetrics                           Anesthesia Physical Anesthesia Plan  ASA: III and emergent  Anesthesia Plan: General   Post-op Pain Management:    Induction: Intravenous  Airway Management Planned: Oral ETT  Additional Equipment:   Intra-op Plan:   Post-operative Plan: Extubation in OR  Informed Consent: I have reviewed the patients History and Physical, chart, labs and discussed the procedure including the risks, benefits and alternatives for the proposed anesthesia with the patient or authorized representative who has indicated his/her understanding and acceptance.   Dental advisory given  Plan Discussed with: Surgeon and CRNA  Anesthesia Plan Comments: (Plan routine monitors, GETA)        Anesthesia Quick Evaluation

## 2015-04-01 NOTE — Transfer of Care (Signed)
Immediate Anesthesia Transfer of Care Note  Patient: Holly Mendoza  Procedure(s) Performed: Procedure(s): REVISION OF ARTERIOVENOUS GORETEX GRAFT (Left)  Patient Location: PACU  Anesthesia Type:General  Level of Consciousness: awake, alert  and patient cooperative  Airway & Oxygen Therapy: Patient Spontanous Breathing and Patient connected to face mask oxygen  Post-op Assessment: Report given to RN and Post -op Vital signs reviewed and stable  Post vital signs: Reviewed and stable  Last Vitals:  Filed Vitals:   04/01/15 2005  BP: 122/82  Pulse: 73  Temp:   Resp: 17    Complications: No apparent anesthesia complications

## 2015-04-01 NOTE — Progress Notes (Signed)
   04/01/15 1000  Clinical Encounter Type  Visited With Patient  Visit Type Initial;Spiritual support;Social support  Spiritual Encounters  Spiritual Needs Prayer   Chaplain was referred to patient via spiritual care consult. Consult indicated patient would like to create or update an advanced directive. Chaplain visited with patient briefly this morning. Patient already had a copy of an advanced directive and said she simply needed to fill it out. Patient had no questions regarding advanced directive at this time. Patient did ask chaplain to pray for her. Specifically patient wanted chaplain to pray that she will be able to continue her journey without being afraid. Chaplain prayed with patient. Please call spiritual care department if patient needs assistance completing advanced directive.  Cranston Neighbor, Chaplain  10:57 AM

## 2015-04-01 NOTE — Progress Notes (Addendum)
Patient is having bleeding from an "opening" in the skin which was close to the needle stick site.  She was half way through HD when she started bleeding through this opening. HD was stopped and pressure is being held. She had a normal INR 2 days ago, is not on coumadin or any formal anticoagulation. She has been getting SQ hep for DVT prophylaxis and takes ASA daily.  Have asked [facility] to evaluate.  No further HD today. Have stopped SQ heparin and ASA for now.   Vinson Moselle MD (pgr) [phone]    [phone] 04/01/2015, 4:36 PM

## 2015-04-01 NOTE — Progress Notes (Signed)
     Subjective:  Still c/o some CP  Objective:  Temp:  [97.2 F (36.2 C)-98.4 F (36.9 C)] 98.2 F (36.8 C) (06/13 0845) Pulse Rate:  [73-117] 97 (06/13 0327) Resp:  [18-24] 23 (06/13 0845) BP: (92-154)/(65-133) 100/69 mmHg (06/13 0845) SpO2:  [90 %-100 %] 95 % (06/13 0845) Weight change:   Intake/Output from previous day: 06/12 0701 - 06/13 0700 In: 600 [P.O.:600] Out: -   Intake/Output from this shift: Total I/O In: 240 [P.O.:240] Out: -   Physical Exam: General appearance: alert and no distress Neck: no adenopathy, no carotid bruit, no JVD, supple, symmetrical, trachea midline and thyroid not enlarged, symmetric, no tenderness/mass/nodules Lungs: clear to auscultation bilaterally Heart: Tachy , hard to hear a rub today Extremities: extremities normal, atraumatic, no cyanosis or edema  Lab Results: Results for orders placed or performed during the hospital encounter of 03/28/15 (from the past 48 hour(s))  Renal function panel     Status: Abnormal   Collection Time: 03/31/15  3:03 AM  Result Value Ref Range   Sodium 133 (L) 135 - 145 mmol/L   Potassium 5.1 3.5 - 5.1 mmol/L   Chloride 92 (L) 101 - 111 mmol/L   CO2 21 (L) 22 - 32 mmol/L   Glucose, Bld 92 65 - 99 mg/dL   BUN 85 (H) 6 - 20 mg/dL   Creatinine, Ser 11.49 (H) 0.44 - 1.00 mg/dL   Calcium 7.4 (L) 8.9 - 10.3 mg/dL   Phosphorus 10.4 (H) 2.5 - 4.6 mg/dL   Albumin 1.5 (L) 3.5 - 5.0 g/dL   GFR calc non Af Amer 4 (L) >60 mL/min   GFR calc Af Amer 4 (L) >60 mL/min    Comment: (NOTE) The eGFR has been calculated using the CKD EPI equation. This calculation has not been validated in all clinical situations. eGFR's persistently <60 mL/min signify possible Chronic Kidney Disease.    Anion gap 20 (H) 5 - 15    Imaging: Imaging results have been reviewed  Tele- ST 100-120  Assessment/Plan:   1. Principal Problem: 2.   Acute on chronic diastolic CHF (congestive heart failure), NYHA class 4 3. Active  Problems: 4.   ESRD (end stage renal disease) on dialysis 5.   Ascites 6.   Hyperkalemia 7.   Troponin level elevated 8.   Chest pain with high risk for cardiac etiology 9.   ESRD (end stage renal disease) 10.   Palliative care encounter 11.   Elevated troponin I level 12.   Noncompliance 13.   Pericardial effusion 14.   Time Spent Directly with Patient:  20 minutes  Length of Stay:  LOS: 4 days   Data base reviewed. 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 Large pericardial effusion on recent 2D (progressed from 3 months ago). Options include pericardiocentesis vs more aggressive HD with repeat 2D. She does not have tamponade physiology. Pt prefers HD but is receptive to an invasive approach if this isn't effective. Will re check 2D later this week.    Quay Burow 04/01/2015, 10:46 AM

## 2015-04-01 NOTE — Op Note (Signed)
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  Assistant: Lianne Cure PA-C  Operative findings: Degenerated graft left thigh replaced with interposition graft from 2:00 to 6:00 position  Operative details: After obtaining informed consent, the patient was taken to the operating room. The patient was placed in supine position on the operating room table. After induction of general anesthesia and endotracheal intubation, the patient's left anterior thigh was prepped and draped in usual sterile fashion. Next a transverse incision was made over the graft at approximately the 2:00 position. Incision was carried down through the subcutaneous tissues down to level of the pre-existing graft. It had a pulse within it. The graft was aneurysmal distal on the thigh to this incision. I dissected the graft free circumferentially to a more normal-appearing caliber graft. A vessel loop was placed around this. There was significant edema fluid around the graft. Next an additional transverse incision was made in the distal thigh at approximate the 6:00 position of the graft. In similar fashion the incision was carried through the subcutaneous tissues down to level graft. The graft was dissected free circumferentially. A vessel loop was also placed around this. The patient was then given 7000 units of intravenous heparin. The graft was controlled proximally in the high thigh incision and distally in the 6:00 incision the graft was then transected in both of these incisions. A new piece of 7 mm PTFE was tunneled subcutaneously lateral to the old graft. The new interposition graft was then sewn end and to the venous limb of the graft using a running 60 proline suture. A completion anastomosis the clamp was moved up to the more proximal arterial portion of the graft. This was then cut to length and sewn  end and to the graft in the 2:00 incision. Just prior completion anastomosis this was forebled backbled and thoroughly flushed. Anastomosis was secured clamps released there was good flow in the graft immediately. The suture line of each anastomosis was slightly loose and this was repaired with a 60 proline suture. Hemostasis was also obtained with protamine and thrombin Gelfoam. After hemostasis was obtained the subcutaneous tissues of each incision was closed with a running 30 Michael suture. The skin of both incisions was closed with a Vicryls and 4-0 subcuticular stitch. Liquid bandage was applied to both incisions. The patient tolerated the procedure well and there were no complications. The instrument sponge and needle count was correct at the end of the case. The patient was taken to the recovery room in stable condition.  Fabienne Bruns, MD [facility] Office: [phone] Pager: [phone]

## 2015-04-02 ENCOUNTER — Inpatient Hospital Stay (HOSPITAL_COMMUNITY): Payer: Medicare Other

## 2015-04-02 ENCOUNTER — Encounter (HOSPITAL_COMMUNITY): Payer: Self-pay | Admitting: Family Medicine

## 2015-04-02 ENCOUNTER — Encounter (HOSPITAL_COMMUNITY)
Admission: EM | Disposition: A | Discharge status: Home / Self Care | Payer: Medicare Other | Source: Home / Self Care | Attending: Internal Medicine

## 2015-04-02 DIAGNOSIS — E875 Hyperkalemia: Secondary | ICD-10-CM

## 2015-04-02 DIAGNOSIS — N186 End stage renal disease: Secondary | ICD-10-CM | POA: Diagnosis present

## 2015-04-02 DIAGNOSIS — Z992 Dependence on renal dialysis: Secondary | ICD-10-CM

## 2015-04-02 DIAGNOSIS — R7989 Other specified abnormal findings of blood chemistry: Secondary | ICD-10-CM

## 2015-04-02 DIAGNOSIS — I314 Cardiac tamponade: Secondary | ICD-10-CM | POA: Diagnosis not present

## 2015-04-02 DIAGNOSIS — I5033 Acute on chronic diastolic (congestive) heart failure: Secondary | ICD-10-CM | POA: Diagnosis present

## 2015-04-02 DIAGNOSIS — F4323 Adjustment disorder with mixed anxiety and depressed mood: Secondary | ICD-10-CM

## 2015-04-02 DIAGNOSIS — R778 Other specified abnormalities of plasma proteins: Secondary | ICD-10-CM | POA: Diagnosis present

## 2015-04-02 DIAGNOSIS — I9589 Other hypotension: Secondary | ICD-10-CM

## 2015-04-02 DIAGNOSIS — K7460 Unspecified cirrhosis of liver: Secondary | ICD-10-CM | POA: Diagnosis present

## 2015-04-02 DIAGNOSIS — E8770 Fluid overload, unspecified: Secondary | ICD-10-CM

## 2015-04-02 HISTORY — PX: CARDIAC CATHETERIZATION: SHX172

## 2015-04-02 LAB — COMPREHENSIVE METABOLIC PANEL
ALBUMIN: 1.3 g/dL — AB (ref 3.5–5.0)
ALT: 28 U/L (ref 14–54)
AST: 34 U/L (ref 15–41)
Alkaline Phosphatase: 128 U/L — ABNORMAL HIGH (ref 38–126)
Anion gap: 19 — ABNORMAL HIGH (ref 5–15)
BUN: 104 mg/dL — ABNORMAL HIGH (ref 6–20)
CHLORIDE: 91 mmol/L — AB (ref 101–111)
CO2: 22 mmol/L (ref 22–32)
Calcium: 6.8 mg/dL — ABNORMAL LOW (ref 8.9–10.3)
Creatinine, Ser: 13.33 mg/dL — ABNORMAL HIGH (ref 0.44–1.00)
GFR calc Af Amer: 3 mL/min — ABNORMAL LOW (ref >60)
GFR calc non Af Amer: 3 mL/min — ABNORMAL LOW (ref >60)
Glucose, Bld: 84 mg/dL (ref 65–99)
Potassium: 6.2 mmol/L (ref 3.5–5.1)
Sodium: 132 mmol/L — ABNORMAL LOW (ref 135–145)
Total Bilirubin: 0.5 mg/dL (ref 0.3–1.2)
Total Protein: 4.2 g/dL — ABNORMAL LOW (ref 6.5–8.1)

## 2015-04-02 LAB — CBC WITH DIFFERENTIAL/PLATELET
Basophils Absolute: 0 10*3/uL (ref 0.0–0.1)
Basophils Relative: 0 % (ref 0–1)
EOS ABS: 0.1 10*3/uL (ref 0.0–0.7)
Eosinophils Relative: 1 % (ref 0–5)
HEMATOCRIT: 26 % — AB (ref 36.0–46.0)
Hemoglobin: 8.5 g/dL — ABNORMAL LOW (ref 12.0–15.0)
LYMPHS ABS: 0.9 10*3/uL (ref 0.7–4.0)
LYMPHS PCT: 6 % — AB (ref 12–46)
MCH: 28.1 pg (ref 26.0–34.0)
MCHC: 32.7 g/dL (ref 30.0–36.0)
MCV: 85.8 fL (ref 78.0–100.0)
MONO ABS: 1.4 10*3/uL — AB (ref 0.1–1.0)
MONOS PCT: 10 % (ref 3–12)
NEUTROS ABS: 11.6 10*3/uL — AB (ref 1.7–7.7)
NEUTROS PCT: 83 % — AB (ref 43–77)
Platelets: 301 10*3/uL (ref 150–400)
RBC: 3.03 MIL/uL — AB (ref 3.87–5.11)
RDW: 15 % (ref 11.5–15.5)
WBC: 13.9 10*3/uL — AB (ref 4.0–10.5)

## 2015-04-02 LAB — BODY FLUID CELL COUNT WITH DIFFERENTIAL
EOS FL: NONE SEEN %
Lymphs, Fluid: 14 %
MONOCYTE-MACROPHAGE-SEROUS FLUID: 6 % — AB (ref 50–90)
Neutrophil Count, Fluid: 80 % — ABNORMAL HIGH (ref 0–25)
Total Nucleated Cell Count, Fluid: 1094 cu mm — ABNORMAL HIGH (ref 0–1000)

## 2015-04-02 LAB — MAGNESIUM: Magnesium: 1.8 mg/dL (ref 1.7–2.4)

## 2015-04-02 LAB — CORTISOL-AM, BLOOD: Cortisol - AM: 23.3 ug/dL — ABNORMAL HIGH (ref 6.7–22.6)

## 2015-04-02 LAB — TSH: TSH: 26.906 u[IU]/mL — ABNORMAL HIGH (ref 0.350–4.500)

## 2015-04-02 SURGERY — PERICARDIOCENTESIS
Anesthesia: LOCAL

## 2015-04-02 MED ORDER — NEPRO/CARBSTEADY PO LIQD: 237.0000 mL | ORAL | Status: DC | PRN

## 2015-04-02 MED ORDER — HEPARIN SODIUM (PORCINE) 1000 UNIT/ML DIALYSIS: 1000.0000 [IU] | INTRAMUSCULAR | Status: DC | PRN

## 2015-04-02 MED ORDER — MIDAZOLAM HCL 2 MG/2ML IJ SOLN
INTRAMUSCULAR | Status: DC | PRN
  Administered 2015-04-02 (×2): 1 mg via INTRAVENOUS

## 2015-04-02 MED ORDER — SODIUM CHLORIDE 0.9 % IV SOLN
250.0000 mL | INTRAVENOUS | Status: DC | PRN
  Administered 2015-04-03: 250 mL via INTRAVENOUS

## 2015-04-02 MED ORDER — SODIUM CHLORIDE 0.9 % IV SOLN: 100.0000 mL | INTRAVENOUS | Status: DC | PRN

## 2015-04-02 MED ORDER — ACETAMINOPHEN 325 MG PO TABS
650.0000 mg | ORAL_TABLET | ORAL | Status: DC | PRN
  Administered 2015-04-05 – 2015-04-06 (×2): 650 mg via ORAL
  Filled 2015-04-02 (×2): qty 2

## 2015-04-02 MED ORDER — NEPRO/CARBSTEADY PO LIQD
237.0000 mL | ORAL | Status: DC | PRN
  Filled 2015-04-02: qty 237

## 2015-04-02 MED ORDER — LIDOCAINE HCL (PF) 1 % IJ SOLN: 5.0000 mL | INTRAMUSCULAR | Status: DC | PRN

## 2015-04-02 MED ORDER — PENTAFLUOROPROP-TETRAFLUOROETH EX AERO: 1.0000 "application " | INHALATION_SPRAY | CUTANEOUS | Status: DC | PRN

## 2015-04-02 MED ORDER — SODIUM CHLORIDE 0.9 % IJ SOLN
3.0000 mL | INTRAMUSCULAR | Status: DC | PRN
Start: 2015-04-02 — End: 2015-04-03

## 2015-04-02 MED ORDER — OXYCODONE HCL 5 MG PO TABS
5.0000 mg | ORAL_TABLET | Freq: Once | ORAL | Status: AC
  Administered 2015-04-02: 5 mg via ORAL
  Filled 2015-04-02: qty 1

## 2015-04-02 MED ORDER — SODIUM CHLORIDE 0.9 % IJ SOLN
3.0000 mL | Freq: Two times a day (BID) | INTRAMUSCULAR | Status: DC
  Administered 2015-04-03 (×2): 3 mL via INTRAVENOUS

## 2015-04-02 MED ORDER — ALTEPLASE 2 MG IJ SOLR
2.0000 mg | Freq: Once | INTRAMUSCULAR | Status: DC | PRN
  Filled 2015-04-02: qty 2

## 2015-04-02 MED ORDER — MIDODRINE HCL 5 MG PO TABS
5.0000 mg | ORAL_TABLET | Freq: Three times a day (TID) | ORAL | Status: DC
  Administered 2015-04-02 – 2015-04-03 (×5): 5 mg via ORAL
  Filled 2015-04-02 (×6): qty 1

## 2015-04-02 MED ORDER — LIDOCAINE HCL (PF) 1 % IJ SOLN
INTRAMUSCULAR | Status: AC
  Filled 2015-04-02: qty 30

## 2015-04-02 MED ORDER — SODIUM CHLORIDE 0.9 % IV SOLN
250.0000 mL | INTRAVENOUS | Status: DC | PRN
Start: 2015-04-02 — End: 2015-04-03

## 2015-04-02 MED ORDER — MIDAZOLAM HCL 2 MG/2ML IJ SOLN
INTRAMUSCULAR | Status: AC
  Filled 2015-04-02: qty 2

## 2015-04-02 MED ORDER — ONDANSETRON HCL 4 MG/2ML IJ SOLN
4.0000 mg | Freq: Four times a day (QID) | INTRAMUSCULAR | Status: DC | PRN
  Administered 2015-04-05 – 2015-04-06 (×2): 4 mg via INTRAVENOUS
  Filled 2015-04-02 (×2): qty 2

## 2015-04-02 MED ORDER — SODIUM CHLORIDE 0.9 % IJ SOLN: 3.0000 mL | INTRAMUSCULAR | Status: DC | PRN

## 2015-04-02 MED ORDER — ALBUMIN HUMAN 25 % IV SOLN
INTRAVENOUS | Status: AC
  Administered 2015-04-02: 25 g
  Filled 2015-04-02: qty 100

## 2015-04-02 MED ORDER — FENTANYL CITRATE (PF) 100 MCG/2ML IJ SOLN
INTRAMUSCULAR | Status: DC | PRN
  Administered 2015-04-02 (×2): 25 ug via INTRAVENOUS

## 2015-04-02 MED ORDER — FENTANYL CITRATE (PF) 100 MCG/2ML IJ SOLN
INTRAMUSCULAR | Status: AC
  Filled 2015-04-02: qty 2

## 2015-04-02 MED ORDER — ALTEPLASE 2 MG IJ SOLR: 2.0000 mg | Freq: Once | INTRAMUSCULAR | Status: DC | PRN

## 2015-04-02 MED ORDER — HEPARIN (PORCINE) IN NACL 2-0.9 UNIT/ML-% IJ SOLN
INTRAMUSCULAR | Status: AC
  Filled 2015-04-02: qty 1000

## 2015-04-02 MED ORDER — LIDOCAINE-PRILOCAINE 2.5-2.5 % EX CREA
1.0000 "application " | TOPICAL_CREAM | CUTANEOUS | Status: DC | PRN
  Filled 2015-04-02: qty 5

## 2015-04-02 MED ORDER — LIDOCAINE HCL (PF) 1 % IJ SOLN
INTRAMUSCULAR | Status: DC | PRN
  Administered 2015-04-02: 5 mL via SUBCUTANEOUS

## 2015-04-02 MED ORDER — LIDOCAINE-PRILOCAINE 2.5-2.5 % EX CREA: 1.0000 "application " | TOPICAL_CREAM | CUTANEOUS | Status: DC | PRN

## 2015-04-02 SURGICAL SUPPLY — 2 items
PACK CARDIAC CATHETERIZATION (CUSTOM PROCEDURE TRAY) ×2 IMPLANT
PERIVAC PERICARDIOCENTESIS 8.3 (TRAY / TRAY PROCEDURE) ×2 IMPLANT

## 2015-04-02 NOTE — Progress Notes (Signed)
Patient Name: Holly Mendoza Date of Encounter: 04/02/2015  Principal Problem:   Adjustment disorder with mixed anxiety and depressed mood Active Problems:   ESRD (end stage renal disease) on dialysis   Ascites   Hyperkalemia   Troponin level elevated   Chest pain with high risk for cardiac etiology   Acute on chronic diastolic CHF (congestive heart failure), NYHA class 4   ESRD (end stage renal disease)   Palliative care encounter   Elevated troponin I level   Noncompliance   Pericardial effusion   Acute on chronic diastolic CHF (congestive heart failure)   Elevated troponin   Cirrhosis   End stage renal disease on dialysis   Other specified hypotension   Hypervolemia   Length of Stay: 5  SUBJECTIVE  Doing poorly. Unable to tolerate any real hemodialysis due to hypotension.  CURRENT MEDS . calcium acetate  667 mg Oral TID WC  . midodrine  5 mg Oral TID WC  . multivitamin  1 tablet Oral QHS  . senna-docusate  2 tablet Oral BID    OBJECTIVE   Intake/Output Summary (Last 24 hours) at 04/02/15 1555 Last data filed at 04/02/15 1505  Gross per 24 hour  Intake    250 ml  Output   1353 ml  Net  -1103 ml   Filed Weights   03/31/15 0415 04/02/15 1300 04/02/15 1505  Weight: 188 lb 0.8 oz (85.3 kg) 186 lb 8.2 oz (84.6 kg) 186 lb 15.2 oz (84.8 kg)    PHYSICAL EXAM Filed Vitals:   04/02/15 1400 04/02/15 1435 04/02/15 1437 04/02/15 1505  BP: 88/58 76/55 98/56  88/57  Pulse: 103 107 102 108  Temp:    98 F (36.7 C)  TempSrc:    Oral  Resp: 20 25 25 18   Height:      Weight:    186 lb 15.2 oz (84.8 kg)  SpO2:    96%   General: groggy, frail appearing, but appears to be well oriented Head: no evidence of trauma, PERRL, EOMI, no exophtalmos or lid lag, no myxedema, no xanthelasma; normal ears, nose and oropharynx Neck: markedly elevated jugular venous pulsations; brisk carotid pulses without delay and no carotid bruits Chest: reduced breath sounds in bases, no  rales Cardiovascular: effaced apical impulse, rapid irregular rhythm, distant first and second heart sounds, no rubs or murmur Abdomen: no tenderness or distention, no masses by palpation, no abnormal pulsatility or arterial bruits, normal bowel sounds, no hepatosplenomegaly Extremities: no clubbing, cyanosis or edema Neurological: grossly nonfocal  LABS  CBC  Recent Labs  04/01/15 1731 04/02/15 1345  WBC  --  13.9*  NEUTROABS  --  11.6*  HGB 11.2* 8.5*  HCT 33.0* 26.0*  MCV  --  85.8  PLT  --  301   Basic Metabolic Panel  Recent Labs  03/31/15 0303 04/01/15 1731 04/02/15 1118  NA 133* 128* 132*  K 5.1 6.0* 6.2*  CL 92*  --  91*  CO2 21*  --  22  GLUCOSE 92 80 84  BUN 85*  --  104*  CREATININE 11.49*  --  13.33*  CALCIUM 7.4*  --  6.8*  MG  --   --  1.8  PHOS 10.4*  --   --    Liver Function Tests  Recent Labs  03/31/15 0303 04/02/15 1118  AST  --  34  ALT  --  28  ALKPHOS  --  128*  BILITOT  --  0.5  PROT  --  4.2*  ALBUMIN 1.5* 1.3*    Recent Labs  04/02/15 1118  TSH 26.906*    Radiology Studies Imaging results have been reviewed and No results found.  TELE Sinus tachycardia with frequent PACs and PVCs   ASSESSMENT AND PLAN  She has clinical and echo findings of pericardial tamponade. At this point there is significant hemodynamic compromise and the exact etiology (myxedematous, uremic, etc.) is not immediately relevant since a more urgent solution is needed. I reviewed her echo from 6/10. The classical signs of right heart chamber collapse are not seen due to preexistent pulmonary HTN. 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 She will need pericardiocentesis. Hemodialysis will not be possible until pericardial fluid is drained and her clinical condition will continue to  deteriorate. This procedure has been fully reviewed with the patient and written informed consent has been obtained. She is waiting to speak to her mother, Okey Regal.  Thurmon Fair, MD, [facility] [phone] office [phone] pager 04/02/2015 3:55 PM

## 2015-04-02 NOTE — Progress Notes (Signed)
Report called to Jenetta Downer RN. Pt ran 2 hrs of HD tx. No fluid removed. ST and hypotensive. MD present and aware. Pt. States she feels dizzy. Pt returned safely to room. CCMD notified.

## 2015-04-02 NOTE — H&P (View-Only) (Signed)
Patient Name: Holly Mendoza Date of Encounter: 04/02/2015  Principal Problem:   Adjustment disorder with mixed anxiety and depressed mood Active Problems:   ESRD (end stage renal disease) on dialysis   Ascites   Hyperkalemia   Troponin level elevated   Chest pain with high risk for cardiac etiology   Acute on chronic diastolic CHF (congestive heart failure), NYHA class 4   ESRD (end stage renal disease)   Palliative care encounter   Elevated troponin I level   Noncompliance   Pericardial effusion   Acute on chronic diastolic CHF (congestive heart failure)   Elevated troponin   Cirrhosis   End stage renal disease on dialysis   Other specified hypotension   Hypervolemia   Length of Stay: 5  SUBJECTIVE  Doing poorly. Unable to tolerate any real hemodialysis due to hypotension.  CURRENT MEDS . calcium acetate  667 mg Oral TID WC  . midodrine  5 mg Oral TID WC  . multivitamin  1 tablet Oral QHS  . senna-docusate  2 tablet Oral BID    OBJECTIVE   Intake/Output Summary (Last 24 hours) at 04/02/15 1555 Last data filed at 04/02/15 1505  Gross per 24 hour  Intake    250 ml  Output   1353 ml  Net  -1103 ml   Filed Weights   03/31/15 0415 04/02/15 1300 04/02/15 1505  Weight: 188 lb 0.8 oz (85.3 kg) 186 lb 8.2 oz (84.6 kg) 186 lb 15.2 oz (84.8 kg)    PHYSICAL EXAM Filed Vitals:   04/02/15 1400 04/02/15 1435 04/02/15 1437 04/02/15 1505  BP: 88/58 76/55 98/56 88/57  Pulse: 103 107 102 108  Temp:    98 F (36.7 C)  TempSrc:    Oral  Resp: 20 25 25 18  Height:      Weight:    186 lb 15.2 oz (84.8 kg)  SpO2:    96%   General: groggy, frail appearing, but appears to be well oriented Head: no evidence of trauma, PERRL, EOMI, no exophtalmos or lid lag, no myxedema, no xanthelasma; normal ears, nose and oropharynx Neck: markedly elevated jugular venous pulsations; brisk carotid pulses without delay and no carotid bruits Chest: reduced breath sounds in bases, no  rales Cardiovascular: effaced apical impulse, rapid irregular rhythm, distant first and second heart sounds, no rubs or murmur Abdomen: no tenderness or distention, no masses by palpation, no abnormal pulsatility or arterial bruits, normal bowel sounds, no hepatosplenomegaly Extremities: no clubbing, cyanosis or edema Neurological: grossly nonfocal  LABS  CBC  Recent Labs  04/01/15 1731 04/02/15 1345  WBC  --  13.9*  NEUTROABS  --  11.6*  HGB 11.2* 8.5*  HCT 33.0* 26.0*  MCV  --  85.8  PLT  --  301   Basic Metabolic Panel  Recent Labs  03/31/15 0303 04/01/15 1731 04/02/15 1118  NA 133* 128* 132*  K 5.1 6.0* 6.2*  CL 92*  --  91*  CO2 21*  --  22  GLUCOSE 92 80 84  BUN 85*  --  104*  CREATININE 11.49*  --  13.33*  CALCIUM 7.4*  --  6.8*  MG  --   --  1.8  PHOS 10.4*  --   --    Liver Function Tests  Recent Labs  03/31/15 0303 04/02/15 1118  AST  --  34  ALT  --  28  ALKPHOS  --  128*  BILITOT  --  0.5  PROT  --    4.2*  ALBUMIN 1.5* 1.3*    Recent Labs  04/02/15 1118  TSH 26.906*    Radiology Studies Imaging results have been reviewed and No results found.  TELE Sinus tachycardia with frequent PACs and PVCs   ASSESSMENT AND PLAN  She has clinical and echo findings of pericardial tamponade. At this point there is significant hemodynamic compromise and the exact etiology (myxedematous, uremic, etc.) is not immediately relevant since a more urgent solution is needed. I reviewed her echo from 6/10. The classical signs of right heart chamber collapse are not seen due to preexistent pulmonary HTN. 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 She will need pericardiocentesis. Hemodialysis will not be possible until pericardial fluid is drained and her clinical condition will continue to  deteriorate. This procedure has been fully reviewed with the patient and written informed consent has been obtained. She is waiting to speak to her mother, Holly Mendoza.  Holly Christiana, MD, FACC [facility] [phone] office [phone] pager 04/02/2015 3:55 PM   

## 2015-04-02 NOTE — Interval H&P Note (Signed)
History and Physical Interval Note:  04/02/2015 5:59 PM  Holly Mendoza  has presented today for surgery, with the diagnosis of pericardial tamponade  The various methods of treatment have been discussed with the patient and family. After consideration of risks, benefits and other options for treatment, the patient has consented to  Procedure(s): Pericardiocentesis (N/A) as a surgical intervention .  The patient's history has been reviewed, patient examined, no change in status, stable for surgery.  I have reviewed the patient's chart and labs.  Questions were answered to the patient's satisfaction.     Westyn Keatley S.

## 2015-04-02 NOTE — Progress Notes (Signed)
Pt bp 77/48. Pt c/o "lightheadness and feeling weak all over and feeling like something bad was going to happen" Pt also still c/o of pain 10/10 in the L foot. The foot is cool to touch lower extremities pulses doppler. Heat applied to the foot and pt reposition and stated it still hurts but is a little better. Pt educated on why she could not receive pain medication when her BP is low. She stated she understood.  Pt given support and NP made aware. Will continue to monitor.

## 2015-04-02 NOTE — Progress Notes (Signed)
Pt sbp is in the still 70's. Last BP 78/45 (54). NP made aware and gave order to give NS bolus. Will continue to monitor.

## 2015-04-02 NOTE — Progress Notes (Signed)
Daily Progress Note   Patient Name: Holly Mendoza       Date: 04/02/2015 DOB: 04-27-1971  Age: 44 y.o. MRN#: [id number] Attending Physician: Holly Dallas, MD Primary Care Physician: Holly German, MD Admit Date: 03/28/2015   Subjective:     Holly Mendoza is lying in bed and seems a little groggy. She is still saying she wishes to continue dialysis. She also expresses many times that she was afraid last night. She talks about feeling afraid and that feels she is unable to be watched close enough in dialysis and also says that the nurses have not checked on her much. I reassured her that she is being closely monitored by numerous people. Asked nursing to make frequent checks on her. Will proceed with continuing dialysis. She is also concerned with pain.   **Of note I also learned that she was being seen by palliative care as outpatient (via new outpatient program with Holly Mendoza) and was previously DNR and contemplating stopping dialysis.**  I will follow up tomorrow.    Length of Stay: 5 days  Current Medications: Scheduled Meds:  . calcium acetate  667 mg Oral TID WC  . midodrine  5 mg Oral TID WC  . multivitamin  1 tablet Oral QHS  . senna-docusate  2 tablet Oral BID    Continuous Infusions:    PRN Meds: sodium chloride, sodium chloride, sodium chloride, sodium chloride, sodium chloride, sodium chloride, alteplase, alteplase, feeding supplement (NEPRO CARB STEADY), feeding supplement (NEPRO CARB STEADY), feeding supplement (NEPRO CARB STEADY), fentaNYL (SUBLIMAZE) injection, heparin, lidocaine (PF), lidocaine (PF), lidocaine (PF), lidocaine-prilocaine, lidocaine-prilocaine, lidocaine-prilocaine, pentafluoroprop-tetrafluoroeth, pentafluoroprop-tetrafluoroeth, pentafluoroprop-tetrafluoroeth, phenol, zolpidem  Palliative Performance Scale: 50%     Vital Signs: BP 105/84 mmHg  Pulse 103  Temp(Src) 97.6 F (36.4 C) (Oral)  Resp 16  Ht 5' 9.5" (1.765 m)  Wt 84.6 kg (186 lb 8.2 oz)   BMI 27.16 kg/m2  SpO2 98% SpO2: SpO2: 98 % O2 Device: O2 Device: Nasal Cannula O2 Flow Rate: O2 Flow Rate (L/min): 2 L/min  Intake/output summary:  Intake/Output Summary (Last 24 hours) at 04/02/15 1359 Last data filed at 04/01/15 2340  Gross per 24 hour  Intake    250 ml  Output   1401 ml  Net  -1151 ml   LBM: Last BM Date: 03/29/15 Baseline Weight: Weight: 76.658 kg (169 lb) Most recent weight: Weight: 84.6 kg (186 lb 8.2 oz)  Physical Exam: General: NAD, lying in bed HEENT: [place]/AT, moist mucous membranes CVS: RRR Resp: No labored breathing Abd: Distended Neuro: Awake, alert, oriented x 3   Additional Data Reviewed: Recent Labs     03/31/15  0303  04/01/15  1731  04/02/15  1118  HGB   --   11.2*   --   NA  133*  128*  132*  BUN  85*   --   104*  CREATININE  11.49*   --   13.33*     Problem List:  Patient Active Problem List   Diagnosis Date Noted  . Acute on chronic diastolic CHF (congestive heart failure)   . Elevated troponin   . Cirrhosis   . End stage renal disease on dialysis   . Other specified hypotension   . Hypervolemia   . Adjustment disorder with mixed anxiety and depressed mood 04/01/2015  . Elevated troponin I level   . Noncompliance   . Pericardial effusion   . Palliative care encounter   . Chest pain with high risk for  cardiac etiology 03/28/2015  . Acute on chronic diastolic CHF (congestive heart failure), NYHA class 4 03/28/2015  . ESRD (end stage renal disease) 03/28/2015  . Troponin level elevated 01/13/2015  . Chest pain at rest 01/13/2015  . Hyperkalemia 12/19/2014  . Abdominal pain 12/19/2014  . Anemia of renal disease 08/31/2014  . Acute pulmonary edema   . Ascites 07/11/2014  . 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 Volume overload 05/11/2014  . Pulmonary edema 05/07/2014  . ESRD (end stage renal disease) on dialysis 05/03/2014  . HTN (hypertension) 04/24/2014  . Prolonged Q-T  interval on ECG 04/24/2014  . Hx of ascites 04/20/2014     Palliative Care Assessment & Plan    Code Status:  FULL - will plan to readdress tomorrow  Goals of Care:  Continue with dialysis and indicated interventions.   Encouraged to complete HCPOA.   3. Symptom Management:  Chest pain - chronic:    4. Prognosis: Unable to determine - poor and very fragile.   5. Discharge Planning: To be determined.    Thank you for allowing the Palliative Medicine Team to assist in the care of this patient.   Time In: 1000 Time Out: 1020 Total Time Prolonged Time Billed  no     Greater than 50%  of this time was spent counseling and coordinating care related to the above assessment and plan.   Holly Channel, NP Palliative Medicine Team Pager # [phone] (M-F 8a-5p) Team Phone # [phone] (Nights/Weekends)  04/02/2015, 1:59 PM

## 2015-04-02 NOTE — Progress Notes (Signed)
Pt c/o 10/10 pain in the L foot. No changes from previous assessment. Pt bp stable  Sbp 90-100 over Dbp 59-66. NP made aware and gave order to give oxycodone 5mg  po x 1 dose. Will continue to monitor pt.

## 2015-04-02 NOTE — Progress Notes (Signed)
[facility] [facility] TEAM 1 - Stepdown/ICU TEAM Progress Note  Holly Mendoza WJX:[id number] DOB: 09/19/1971 DOA: 03/28/2015 PCP: Dorrene German, MD  Admit HPI / Brief Narrative: 44 y.o. 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  Presented to the emergency department c/o cough and shortness of breath. Patient reported progressively worsening shortness of breath over a week. She was given levaquin for possible pna but reported she did not take it. She reported constant chest pain for a week, and worsening of chronic ascites. She reported miss her last dialysis.  In the ED course she was found to be markedly fluid overloaded, elevation of troponin, mild elevation of potassium, and EKG with ? Mobitz I with QT prolongation of 520. Nephro was consulted, and patient initially agreed with emergent dialysis. She did not wish to have paracentesis done, She did not wish to have aggressive cardiac intervention however desired to remain full code.   HPI/Subjective: 6/14 A/O 4, complains of sore throat and left thigh pain secondary to her surgery from yesterday. Patient does not have a good understanding of her illness. States she is not worried about dying and its in God's hands.  Assessment/Plan: Severe volume overload due to noncompliance w/ HD -Dialysis is the only effective treatment  - Patient currently understands that with her current BP would not be able to tolerate ultrafiltration/HD has agreed to Midodrin  Large pericardial effusion -another sequelae of noncompliance with hemodialysis; Echo did not reveal tamponade but did note large effusion; NOTE this effusion has increased in size from 12/2014 -after patient has had several sessions of ultrafiltration/HD will obtain repeat echocardiogram to evaluate interval change   HTN/Hyotension -Patient currently hypotensive Start Midodrine 5 mg  TID  ESRD w/ noncompliance w/ HD - Psych has seen patient and states she has capacity/is competent to make her own decisions  -HD/ultrafiltration per nephrology; spoke with Dr. Delano Metz (nephrology) patient will receive HD over the next several days. -Revision left thigh graft 6/14; per nephrology able start using graft right away.   Hyperkalemia due to noncompliance w/ HD -Secondary to noncompliance with dialysis; scheduled for HD today  Acute on chronic diastolic congestive heart failure -Volume management can only be accomplished through effective hemodialysis  Elevated troponin - NSTEMI -Refused planned cardiac cath 6/10 - this is the second time she has been advised to have cath and refused on the day of the procedure (also March 2016)  - Cardiology continues to follow  Cirrhosis / chronic ascites  -Ascites management per hemodialysis     Code Status: FULL Family Communication: Mother and other family members present at time of exam Disposition Plan: Per nephrology     Consultants: Dr. Delano Metz (nephrology) Dr. Sherren Kerns (vascular surgery) NP Ulice Bold (palliative care)   Procedure/Significant Events: 6/10 echocardiogram;- LVEF=60%. - Right ventricle: Mild dilitation. No RV free wall diastolic collapse. - Inferior vena cava: The vessel was dilated. The respirophasic diameter changes were blunted (< 50%), consistent with elevated central venous pressure. - Pericardium, extracardiac: Large circumferential pericardial effusion. No tamponade; however effusion larger than study from 12/2014  6/13 Revision left thigh AV graft   Culture   Antibiotics:   DVT prophylaxis: SCD   Devices    LINES / TUBES:      Continuous Infusions:   Objective: VITAL SIGNS: Temp: 97.7 F (36.5 C) (06/14 0800) Temp Source: Oral (06/14 0800)  BP: 90/56 mmHg (06/14 0545) Pulse Rate: 113 (06/14 0545) SPO2; FIO2:   Intake/Output Summary  (Last 24 hours) at 04/02/15 1033 Last data filed at 04/01/15 2340  Gross per 24 hour  Intake    250 ml  Output   1401 ml  Net  -1151 ml     Exam: General: A/O 4, throat pain secondary to yesterday's intubation for surgery no acute respiratory distress Eyes: Negative headache, negative retinal hemorrhage ENT: Negative Runny nose, negative ear pain, negative tinnitus, negative gingival bleeding,  Neck:  Negative scars, masses, torticollis, lymphadenopathy, JVD Lungs: Clear to auscultation bilaterally without wheezes or crackles Cardiovascular: Tachycardic, Regular rhythm without murmur gallop or rub normal S1 and S2 Abdomen: negative abdominal pain, negative dysphagia, Nontender, positive distention, soft, bowel sounds positive, no rebound, positive ascites, no appreciable mass Extremities: No significant cyanosis, clubbing, or edema bilateral lower extremities Psychiatric:  Negative depression, negative anxiety, negative fatigue, negative mania, however poor understanding of disease process Neurologic:  Cranial nerves II through XII intact, tongue/uvula midline, all extremities muscle strength 5/5, sensation intact throughout, negative dysarthria, negative expressive aphasia, negative receptive aphasia.      Data Reviewed: Basic Metabolic Panel:  Recent Labs Lab 03/28/15 1450 03/29/15 0050 03/29/15 0541 03/30/15 0932 03/31/15 0303 04/01/15 1731  NA 135  --  137 134* 133* 128*  K 5.6*  --  5.1 5.1 5.1 6.0*  CL 95*  --  93* 93* 92*  --   CO2 19*  --  24 19* 21*  --   GLUCOSE 101*  --  99 128* 92 80  BUN 131*  --  90* 116* 85*  --   CREATININE 16.34* 16.42* 12.55* 14.27* 11.49*  --   CALCIUM 7.3*  --  7.7* 7.6* 7.4*  --   PHOS  --   --   --  11.6* 10.4*  --    Liver Function Tests:  Recent Labs Lab 03/30/15 0932 03/31/15 0303  AST 22  --   ALT 25  --   ALKPHOS 112  --   BILITOT 0.4  --   PROT 4.8*  --   ALBUMIN 1.4* 1.5*   No results for input(s): LIPASE,  AMYLASE in the last 168 hours. No results for input(s): AMMONIA in the last 168 hours. CBC:  Recent Labs Lab 03/28/15 1450 03/29/15 0050 03/29/15 0541 03/30/15 0932 04/01/15 1731  WBC 11.8* 12.2* 11.9* 12.6*  --   HGB 9.8* 10.1* 10.6* 9.7* 11.2*  HCT 30.0* 30.0* 32.9* 29.9* 33.0*  MCV 85.2 83.3 84.6 83.8  --   PLT 242 258 246 278  --    Cardiac Enzymes:  Recent Labs Lab 03/28/15 1450 03/29/15 0050 03/29/15 0541 03/29/15 1345  TROPONINI 1.13* 0.74* 0.73* 0.65*   BNP (last 3 results)  Recent Labs  01/13/15 0151 03/22/15 1210 03/28/15 1450  BNP 2116.2* 1475.4* 831.2*    ProBNP (last 3 results)  Recent Labs  08/31/14 1806  PROBNP 46663.0*    CBG: No results for input(s): GLUCAP in the last 168 hours.  Recent Results (from the past 240 hour(s))  MRSA PCR Screening     Status: None   Collection Time: 03/29/15 12:01 AM  Result Value Ref Range Status   MRSA by PCR NEGATIVE NEGATIVE Final    Comment:        The GeneXpert MRSA Assay (FDA approved for NASAL specimens only), is one component of a comprehensive MRSA colonization surveillance program. It is not intended to diagnose MRSA  infection nor to guide or monitor treatment for MRSA infections.   Wound culture     Status: None   Collection Time: 03/29/15 12:50 AM  Result Value Ref Range Status   Specimen Description WOUND LEFT THIGH  Final   Special Requests NONE  Final   Gram Stain   Final    NO WBC SEEN NO SQUAMOUS EPITHELIAL CELLS SEEN NO ORGANISMS SEEN Performed at [facility]    Culture   Final    MULTIPLE ORGANISMS PRESENT, NONE PREDOMINANT Note: NO STAPHYLOCOCCUS AUREUS ISOLATED NO GROUP A STREP (S.PYOGENES) ISOLATED Performed at [facility]    Report Status 03/31/2015 FINAL  Final  Culture, blood (routine x 2)     Status: None (Preliminary result)   Collection Time: 03/29/15 12:55 AM  Result Value Ref Range Status   Specimen Description BLOOD HEMODIALYSIS CATHETER   Final   Special Requests BOTTLES DRAWN AEROBIC AND ANAEROBIC 10CC  Final   Culture   Final           BLOOD CULTURE RECEIVED NO GROWTH TO DATE CULTURE WILL BE HELD FOR 5 DAYS BEFORE ISSUING A FINAL NEGATIVE REPORT Performed at [facility]    Report Status PENDING  Incomplete  Culture, blood (routine x 2)     Status: None (Preliminary result)   Collection Time: 03/29/15  1:45 AM  Result Value Ref Range Status   Specimen Description BLOOD HEMODIALYSIS CATHETER  Final   Special Requests BOTTLES DRAWN AEROBIC AND ANAEROBIC 10CC  Final   Culture   Final           BLOOD CULTURE RECEIVED NO GROWTH TO DATE CULTURE WILL BE HELD FOR 5 DAYS BEFORE ISSUING A FINAL NEGATIVE REPORT Performed at [facility]    Report Status PENDING  Incomplete     Studies:  Recent x-ray studies have been reviewed in detail by the Attending Physician  Scheduled Meds:  Scheduled Meds: . calcium acetate  667 mg Oral TID WC  . midodrine  5 mg Oral TID WC  . multivitamin  1 tablet Oral QHS  . senna-docusate  2 tablet Oral BID    Time spent on care of this patient: 40 mins   WOODS, Roselind Messier , MD  [facility] Office  [phone] Pager - [phone]  On-Call/Text Page:      [email or web address]      password [email or web address]  If 7PM-7AM, please contact night-coverage [email or web address] Password [email or web address] 04/02/2015, 10:33 AM   LOS: 5 days   Care during the described time interval was provided by me .  I have reviewed this patient's available data, including medical history, events of note, physical examination, and all test results as part of my evaluation. I have personally reviewed and interpreted all radiology studies.   Carolyne Littles, MD [phone] Pager

## 2015-04-02 NOTE — Progress Notes (Signed)
CRITICAL VALUE ALERT  Critical value received:  Potassium 6.2  Date of notification:  04/02/15  Time of notification:  1339  Critical value read back:yes  Nurse who received alert:  Carleene Overlie, RN  MD notified (1st page):  Lamar Laundry MD  Time of first page:  1340  MD notified (2nd page): Lamar Laundry MD  Time of second page:1344  Responding MD:  Barnetta Chapel, NP  Time MD responded:  1346

## 2015-04-02 NOTE — Progress Notes (Signed)
Pt arrive from pacu 2150. Pt settled in the bed and placed on monitor. Pt HR 120's and BP is 80/46. At this time pt is not c/o of any dizziness, lightheadedness, n/v, headache, or blurred vision. Pt is AAOX4. Pt is c/o of cold extremities and pain 10/10 in her throat. Np made aware. Pt extremities are cool to touch petal pulse are doppler. L groin site is palpable with positive bruit and thrill but very faint. Pt extremities wrapped in warm blankest with heat pack. Pt stated this helped with pain. Will continue to monitor pt.

## 2015-04-02 NOTE — Progress Notes (Addendum)
     Left thigh graft palpable Incisions clean and dry  Tender to touch Distally left LE active range of motion and sensation intact foot warm and well perfused  S/P Revision left lateral half of thigh graft They may stick medial half of graft only do not stick lateral half for 4 weeks F/U PRN with Dr. Ivar Drape, EMMA [facility] PA-C   Agree with above.  New segment of graft should be useable in 1 month  Will sign off.  Fabienne Bruns, MD [facility] Office: [phone] Pager: [phone]

## 2015-04-02 NOTE — Progress Notes (Deleted)
Patient Name: Holly Mendoza Date of Encounter: 04/02/2015  Primary Cardiologist: Dr. Gala Romney   Principal Problem:   Adjustment disorder with mixed anxiety and depressed mood Active Problems:   ESRD (end stage renal disease) on dialysis   Ascites   Hyperkalemia   Troponin level elevated   Chest pain with high risk for cardiac etiology   Acute on chronic diastolic CHF (congestive heart failure), NYHA class 4   ESRD (end stage renal disease)   Palliative care encounter   Elevated troponin I level   Noncompliance   Pericardial effusion   Acute on chronic diastolic CHF (congestive heart failure)   Elevated troponin   Cirrhosis   End stage renal disease on dialysis   Other specified hypotension   Hypervolemia    SUBJECTIVE  Persistent CP and SOB. Went to the OR last night for L groin AVF graft bleed  CURRENT MEDS . calcium acetate  667 mg Oral TID WC  . midodrine  5 mg Oral TID WC  . multivitamin  1 tablet Oral QHS  . senna-docusate  2 tablet Oral BID    OBJECTIVE  Filed Vitals:   04/02/15 1400 04/02/15 1435 04/02/15 1437 04/02/15 1505  BP: 88/57  Pulse: 103 107 102 108  Temp:    98 F (36.7 C)  TempSrc:    Oral  Resp: Height:      Weight:    186 lb 15.2 oz (84.8 kg)  SpO2:    96%    Intake/Output Summary (Last 24 hours) at 04/02/15 1534 Last data filed at 04/02/15 1505  Gross per 24 hour  Intake    250 ml  Output   1353 ml  Net  -1103 ml   Filed Weights   03/31/15 0415 04/02/15 1300 04/02/15 1505  Weight: 188 lb 0.8 oz (85.3 kg) 186 lb 8.2 oz (84.6 kg) 186 lb 15.2 oz (84.8 kg)    PHYSICAL EXAM  General: Pleasant, NAD. Neuro: Alert and oriented X 3. Moves all extremities spontaneously. Psych: Normal affect. HEENT:  Normal  Neck: Supple without bruits Lungs:  Resp regular and unlabored. LLL rale, markedly decreased breath sound in RLL Heart: RRR no s3, s4, or murmurs. Mild systolic murmur, heart sound distant.    Abdomen: Soft, non-tender, non-distended, BS + x 4.  Extremities: No clubbing, cyanosis or edema. DP/PT/Radials 2+ and equal bilaterally.  Accessory Clinical Findings  CBC  Recent Labs  04/01/15 1731 04/02/15 1345  WBC  --  13.9*  NEUTROABS  --  11.6*  HGB 11.2* 8.5*  HCT 33.0* 26.0*  MCV  --  85.8  PLT  --  301   Basic Metabolic Panel  Recent Labs  03/31/15 0303 04/01/15 1731 04/02/15 1118  NA 133* 128* 132*  K 5.1 6.0* 6.2*  CL 92*  --  91*  CO2 21*  --  22  GLUCOSE 92 80 84  BUN 85*  --  104*  CREATININE 11.49*  --  13.33*  CALCIUM 7.4*  --  6.8*  MG  --   --  1.8  PHOS 10.4*  --   --    Liver Function Tests  Recent Labs  03/31/15 0303 04/02/15 1118  AST  --  34  ALT  --  28  ALKPHOS  --  128*  BILITOT  --  0.5  PROT  --  4.2*  ALBUMIN 1.5* 1.3*   Thyroid Function Tests  Recent Labs  04/02/15  1118  TSH 26.906*    TELE NSR with HR 120s    ECG  No new EKG  Echocardiogram 03/29/2015  Study Conclusions  - Left ventricle: The cavity size was normal. The estimated ejection fraction was 60%. Wall motion was normal; there were no regional wall motion abnormalities. - Mitral valve: Mitral annular calcification. Thickening and calcification of anterior leaflet. No doppler data to assess inflow, but I doubt severe MS. - Right ventricle: Mild dilitation. There is no RV free wall diastolic collapse. - Inferior vena cava: The vessel was dilated. The respirophasic diameter changes were blunted (< 50%), consistent with elevated central venous pressure. - Pericardium, extracardiac: There is a large circumferential pericardial effusion. There is no variation in mitral inflow signal to suggest tamponade. But this effusion is definitely significant. - Impressions: On the study of 12/2014, there was a small pericardial effusion. The effusion now is much larger.  Impressions:  - On the study of 12/2014, there was a small  pericardial effusion. The effusion now is much larger.    Radiology/Studies  Dg Chest 2 View  03/28/2015   CLINICAL DATA:  Increased shortness of breath. End-stage renal disease with dialysis. The patient did not go to dialysis yesterday.  EXAM: CHEST - 2 VIEW  COMPARISON:  Two-view chest 03/22/2015  FINDINGS: The heart is enlarged. Interstitial edema has increased since the prior exam. Small bilateral pleural effusions are noted. Bibasilar airspace disease likely reflects atelectasis.  IMPRESSION: 1. Cardiomegaly and progressive edema and bilateral effusions compatible with congestive heart failure. 2. Bibasilar spur is disease likely reflects atelectasis.   Electronically Signed   By: Marin Roberts M.D.   On: 03/28/2015 15:12   Dg Chest 2 View  03/22/2015   CLINICAL DATA:  Chest pain and shortness of breath for 2 days. Personal history of heart failure.  EXAM: CHEST - 2 VIEW  COMPARISON:  Two-view chest 01/13/2015  FINDINGS: The heart is enlarged. A chronic right pleural effusion is again noted. The a right basilar pleural plaque is not visualized. Mild left basilar airspace opacity is noted. The upper lungs are clear. A hemodialysis shunt is noted in the left upper extremity.  IMPRESSION: 1. Stable cardiomegaly. 2. Chronic right pleural effusion. 3. Minimal left basilar airspace disease. While this likely reflects atelectasis, early infection is also considered.   Electronically Signed   By: Marin Roberts M.D.   On: 03/22/2015 12:32   US Paracentesis  03/13/2015   INDICATION: ascites  EXAM: ULTRASOUND-GUIDED PARACENTESIS  COMPARISON:  Previous paracentesis  MEDICATIONS: 10 cc 1% lidocaine  COMPLICATIONS: None immediate  TECHNIQUE: Informed written consent was obtained from the patient after a discussion of the risks, benefits and alternatives to treatment. A timeout was performed prior to the initiation of the procedure.  Initial ultrasound scanning demonstrates a large amount of  ascites within the right lower abdominal quadrant. The right lower abdomen was prepped and draped in the usual sterile fashion. 1% lidocaine with epinephrine was used for local anesthesia. Under direct ultrasound guidance, a 19 gauge, 7-cm, Yueh catheter was introduced. An ultrasound image was saved for documentation purposed. The paracentesis was performed. The catheter was removed and a dressing was applied. The patient tolerated the procedure well without immediate post procedural complication.  FINDINGS: A total of approximately 6 liters of yellow fluid was removed.  IMPRESSION: Successful ultrasound-guided paracentesis yielding 6 liters of peritoneal fluid.  Read by:  Robet Leu [facility]   Electronically Signed   By: Irish Lack  M.D.   On: 03/13/2015 12:34   US Paracentesis  03/05/2015   CLINICAL DATA:  Ascites  EXAM: ULTRASOUND GUIDED RLQ PARACENTESIS  COMPARISON:  None.  PROCEDURE: An ultrasound guided paracentesis was thoroughly discussed with the patient and questions answered. The benefits, risks, alternatives and complications were also discussed. The patient understands and wishes to proceed with the procedure. Written consent was obtained.  Ultrasound was performed to localize and mark an adequate pocket of fluid in the RLQ quadrant of the abdomen. The area was then prepped and draped in the normal sterile fashion. 1% Lidocaine was used for local anesthesia. Under ultrasound guidance a 19 gauge Yueh catheter was introduced. Paracentesis was performed. The catheter was removed and a dressing applied.  COMPLICATIONS: None.  FINDINGS: A total of approximately 6.8 Liters of Clear yellow fluid was removed. A fluid sample was not sent for laboratory analysis.  IMPRESSION: Successful ultrasound guided paracentesis yielding 6.8 Liters of ascites.  Read by:  Corrin Parker PA-C   Electronically Signed   By: Judie Petit.  Shick M.D.   On: 03/05/2015 11:18    ASSESSMENT AND PLAN  1. 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 Although to clear rub on exam, the tachycardia and hypotension is concerning for temponade physiology, will discuss with MD regarding pericardiocentesis tomorrow AM  2. Elevated trop: 1.13 --> 0.74 --> 0.73 --> 0.65, ?if need cath, appear to have decline cath last week, but may be agreeable for myoview once pericardial effusion resolve and tachycardia improve  3. ESRD on HD: per dialysis nurse, difficult to draw fluid off today due to significant hypotension.   4. Acute on chronic diastolic heart failure Complicated by noncompliance and volume overload with inadequate hemodialysis.  - may need repeat CXR, L lung base has rale, markedly decreased RLL sound, ?pleural effusion  5. Severely elevated TSH: 26.9  - per primary team  6. L thigh AVF graft bleeding: seen by vascular surgery on 6/13, high risk of rebleed with ulceration over graft, underwent urgent OR graft revision yesterday  Signed, Azalee Course PA-C Pager: [phone]

## 2015-04-02 NOTE — Progress Notes (Addendum)
Subjective:   Not feeling well, hurts 'all over'  Objective Filed Vitals:   04/02/15 0545 04/02/15 0800 04/02/15 1100 04/02/15 1143  BP: 90/56  103/75 96/64  Pulse: 113     Temp:  97.7 F (36.5 C)    TempSrc:  Oral    Resp: 22  21   Height:      Weight:      SpO2: 90%      Physical Exam General: alert and oriented, appears to be resting comfortably Heart: RRR Lungs: CTA, ant only. Dry cough Abdomen: distended +ascites. Mild tenderness.  Extremities: +1 L edema.  Dialysis Access:  L thigh AVG patent on HD, using medial aspect of AVg  CXR 6/9 mild edema and bilat effusions ECHO 6/10 - EF 60%, RV mild dilated, IVC dilated c/w ^CVP. Large circumferential pericardial effusion, no signs of tamponade. Larger than 3/26 study.   [facility] MWF 4 hr- rarely runs > 1.5 hours 1 K 2.5 Ca left thigh AVGG NO heparin Refuses Meds most of the time, EDW 72.5  Assessment/Plan: 1. Bleeding from L thigh AVG- s/p revision of L lateral AVG by Dr Darrick Penna 6/13- do not stick lateral half (use MEDIAL LIMB only) for 4 weeks. No heparin in HD.  2. 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 No heparin dialysis for now. Daily dialysis is indicated.   3. SOB/ pulm edema/ pleural effusions -  daily dialysis, UF as tolerated Not sure why she is not tolerating UF with CXR evidence of edema should be responsive to vol removal.  Symptomatic today with less than 500 cc off, BP dropped into 70's w dizziness etc.   4. ESRD - MWF [place], bleeding from AVG during treament yesterday- back today, get back on schedule tomorrow.  5. Anemia - hgb 11.2- watch CBC and restart ESA prn 6. Secondary hyperparathyroidism - corrected Ca+ 9.4- no vit D. Cont phoslo 7. HTN/volume - 96/64 midodrine started. Need accurate wt  8. Nutrition - alb 1.5,renal diet.  nepro and vitamin  Jetty Duhamel, NP [facility] [phone] 04/02/2015,12:54 PM   LOS: 5 days    Pt seen, examined, agree w assess/plan as above with additions as indicated. Difficult problem, not tolerating UF. May need to have pericardial effusion removed/ drained, this might by the issue with her BP's.  Repeat CXR today. Will d/w primary.   Vinson Moselle MD pager [phone]    cell [phone] 04/02/2015, 2:51 PM   Additional Objective Labs: Basic Metabolic Panel:  Recent Labs Lab 03/29/15 0541 03/30/15 0932 03/31/15 0303 04/01/15 1731  NA 137 134* 133* 128*  K 5.1 5.1 5.1 6.0*  CL 93* 93* 92*  --   CO2 24 19* 21*  --   GLUCOSE 99 128* 92 80  BUN 90* 116* 85*  --   CREATININE 12.55* 14.27* 11.49*  --   CALCIUM 7.7* 7.6* 7.4*  --   PHOS  --  11.6* 10.4*  --    Liver Function Tests:  Recent Labs Lab 03/30/15 0932 03/31/15 0303  AST 22  --   ALT 25  --   ALKPHOS 112  --   BILITOT 0.4  --   PROT 4.8*  --   ALBUMIN 1.4* 1.5*   No results for input(s): LIPASE, AMYLASE in the last 168 hours. CBC:  Recent Labs Lab 03/28/15 1450 03/29/15 0050 03/29/15 0541 03/30/15 0932 04/01/15 1731  WBC 11.8* 12.2* 11.9* 12.6*  --  HGB 9.8* 10.1* 10.6* 9.7* 11.2*  HCT 30.0* 30.0* 32.9* 29.9* 33.0*  MCV 85.2 83.3 84.6 83.8  --   PLT 242 258 246 278  --    Blood Culture    Component Value Date/Time   SDES BLOOD HEMODIALYSIS CATHETER 03/29/2015 0145   SPECREQUEST BOTTLES DRAWN AEROBIC AND ANAEROBIC 10CC 03/29/2015 0145   CULT  03/29/2015 0145           BLOOD CULTURE RECEIVED NO GROWTH TO DATE CULTURE WILL BE HELD FOR 5 DAYS BEFORE ISSUING A FINAL NEGATIVE REPORT Performed at [facility]    REPTSTATUS PENDING 03/29/2015 0145    Cardiac Enzymes:  Recent Labs Lab 03/28/15 1450 03/29/15 0050 03/29/15 0541 03/29/15 1345  TROPONINI 1.13* 0.74* 0.73* 0.65*   CBG: No results for input(s): GLUCAP in the last 168 hours. Iron Studies: No results for input(s): IRON, TIBC, TRANSFERRIN, FERRITIN in the last 72  hours. @lablastinr3 @ Studies/Results: No results found. Medications:   . albumin human      . calcium acetate  667 mg Oral TID WC  . midodrine  5 mg Oral TID WC  . multivitamin  1 tablet Oral QHS  . senna-docusate  2 tablet Oral BID

## 2015-04-03 ENCOUNTER — Encounter (HOSPITAL_COMMUNITY): Payer: Self-pay | Admitting: Interventional Cardiology

## 2015-04-03 DIAGNOSIS — I10 Essential (primary) hypertension: Secondary | ICD-10-CM

## 2015-04-03 DIAGNOSIS — I314 Cardiac tamponade: Secondary | ICD-10-CM

## 2015-04-03 LAB — RENAL FUNCTION PANEL
Albumin: 1.5 g/dL — ABNORMAL LOW (ref 3.5–5.0)
Anion gap: 15 (ref 5–15)
BUN: 73 mg/dL — ABNORMAL HIGH (ref 6–20)
CALCIUM: 7.1 mg/dL — AB (ref 8.9–10.3)
CO2: 23 mmol/L (ref 22–32)
CREATININE: 10.41 mg/dL — AB (ref 0.44–1.00)
Chloride: 94 mmol/L — ABNORMAL LOW (ref 101–111)
GFR calc Af Amer: 5 mL/min — ABNORMAL LOW (ref >60)
GFR calc non Af Amer: 4 mL/min — ABNORMAL LOW (ref >60)
GLUCOSE: 86 mg/dL (ref 65–99)
Phosphorus: 10.1 mg/dL — ABNORMAL HIGH (ref 2.5–4.6)
Potassium: 4.9 mmol/L (ref 3.5–5.1)
Sodium: 132 mmol/L — ABNORMAL LOW (ref 135–145)

## 2015-04-03 LAB — CBC
HCT: 25.3 % — ABNORMAL LOW (ref 36.0–46.0)
Hemoglobin: 8.3 g/dL — ABNORMAL LOW (ref 12.0–15.0)
MCH: 28 pg (ref 26.0–34.0)
MCHC: 32.8 g/dL (ref 30.0–36.0)
MCV: 85.5 fL (ref 78.0–100.0)
PLATELETS: 288 10*3/uL (ref 150–400)
RBC: 2.96 MIL/uL — AB (ref 3.87–5.11)
RDW: 14.9 % (ref 11.5–15.5)
WBC: 14 10*3/uL — ABNORMAL HIGH (ref 4.0–10.5)

## 2015-04-03 LAB — AMYLASE, PERITONEAL FLUID: Amylase, peritoneal fluid: 9 U/L

## 2015-04-03 MED ORDER — DARBEPOETIN ALFA 60 MCG/0.3ML IJ SOSY
60.0000 ug | PREFILLED_SYRINGE | INTRAMUSCULAR | Status: DC
  Administered 2015-04-03: 60 ug via INTRAVENOUS

## 2015-04-03 MED ORDER — MIDODRINE HCL 5 MG PO TABS
ORAL_TABLET | ORAL | Status: AC
  Filled 2015-04-03: qty 1

## 2015-04-03 MED ORDER — ALBUMIN HUMAN 25 % IV SOLN
INTRAVENOUS | Status: AC
  Filled 2015-04-03: qty 50

## 2015-04-03 MED ORDER — DARBEPOETIN ALFA 60 MCG/0.3ML IJ SOSY
PREFILLED_SYRINGE | INTRAMUSCULAR | Status: AC
  Administered 2015-04-03: 12:00:00
  Filled 2015-04-03: qty 0.3

## 2015-04-03 MED ORDER — MIDODRINE HCL 5 MG PO TABS
10.0000 mg | ORAL_TABLET | Freq: Three times a day (TID) | ORAL | Status: DC
  Administered 2015-04-03 – 2015-04-05 (×6): 10 mg via ORAL
  Filled 2015-04-03 (×9): qty 2

## 2015-04-03 MED ORDER — ALBUMIN HUMAN 25 % IV SOLN
12.5000 g | Freq: Once | INTRAVENOUS | Status: AC
  Administered 2015-04-03: 12.5 g via INTRAVENOUS
  Filled 2015-04-03: qty 50

## 2015-04-03 MED FILL — Heparin Sodium (Porcine) 2 Unit/ML in Sodium Chloride 0.9%: INTRAMUSCULAR | Qty: 1000 | Status: AC

## 2015-04-03 NOTE — Progress Notes (Addendum)
[place] TEAM 1 - Stepdown/ICU TEAM Progress Note  Holly Mendoza ACZ:[id number] DOB: July 27, 1971 DOA: 03/28/2015 PCP: Dorrene German, MD  Admit HPI / Brief Narrative: 44 y.o. 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 Patient reported progressively worsening shortness of breath over a week.  She was given levaquin for possible pna but reported she did not take it.  She reported constant chest pain for a week, and worsening of chronic ascites.  She reported miss her last dialysis.  In the ED course she was found to be markedly fluid overloaded, elevation of troponin, mild elevation of potassium, and EKG with ? Mobitz I with QT prolongation of 520.  Nephro was consulted, and patient initially agreed with emergent dialysis.  She did not wish to have paracentesis done, She did not wish to have aggressive cardiac intervention however desired to remain full code.  HPI/Subjective: The patient complains of ongoing central chest pain.  She denies fevers chills nausea vomiting or abdominal pain.  Of note the patient signed off hemodialysis early today AMA.  The nurses called to my attention to the fact the patient has very poor venous access.  Currently she has only an IV in a finger.  I have discussed this with the patient explaining her high risk for acute decompensation and the fact that we would need to be able to administer emergency potentially life-saving intravenous medications as quickly as possible.  Her comment to me is that "doctors are always thinking the worst."  I have explained to her that I simply wish to be prepared should "the worst" occur.  Despite my explanation of the indication for central venous access and the implications of not having reliable IV access the patient tells me she is not willing  to have a central line placed currently and that she "wants to think about it for a day or 2."  Assessment/Plan:  Severe volume overload due to noncompliance w/ HD Cont to offer HD as per Nephrology plan   Large pericardial effusion another sequelae of noncompliance with hemodialysis - TTE was not felt initially to display tamponade but subsequent reinterpretation by cardiology combined with clinical scenario was more convincing of tamponade - patient was taken for pericardiocentesis 6/14 and a drainage catheter was left in place - 650 cc of pericardial effusion was removed - despite pericardiocentesis hypotension and tachycardia persist  ESRD w/ noncompliance w/ HD Nephrology is well aware of her concerns regarding natralyte, and has been working with her to determine if there is an alternative available - the patient is presently stating she will comply with dialysis - Psych has seen her this admission and states she has capacity/is competent to make her own decisions   Hypotension  Persist status post pericardiocentesis - continue midodrine but increase to maximum dose  Uncontrollable bleeding from left thigh graft Patient was taken to the OR emergently 6/13 for left thigh graft revision per Vascular Surgery  Hyperkalemia due to noncompliance w/ HD Now within normal range status post dialysis  Acute on chronic diastolic congestive heart failure Volume management can only be accomplished through effective hemodialysis  Elevated troponin - NSTEMI She refused planned cardiac cath 6/10 - this is the second time she has been advised to have cath and refused on the day of the procedure (also March 2016) -  Cardiology continues to follow  Chronic ascites  Ascites management per hemodialysis  Code Status: FULL Family Communication: no family present at time of exam today as pt seen in HD  Disposition Plan: SDU  Consultants: Nephrology Cardiology   Procedures: 6/14 -  pericardiocentesis with placement of drainage catheter 6/13 - emergent revision L thigh graft   Antibiotics: None  DVT prophylaxis: SCDs  Objective: Blood pressure 91/45, pulse 111, temperature 98.2 F (36.8 C), temperature source Oral, resp. rate 16, height 5' 9.5" (1.765 m), weight 84.8 kg (186 lb 15.2 oz), SpO2 100 %.  Intake/Output Summary (Last 24 hours) at 04/03/15 1748 Last data filed at 04/03/15 1700  Gross per 24 hour  Intake    249 ml  Output    953 ml  Net   -704 ml   Exam: General: alert and oriented - facial edema persists - alert and oriented Lungs: Coarse crackles throughout all fields to include apices - no active wheeze Cardiovascular: Regular rate and rhythm without murmur or gallop - rub appreciable today Abdomen: Nontender, protuberant with suggestion of ascites, soft, bowel sounds positive, no rebound Extremities: No significant cyanosis, or clubbing;  2+edema bilateral lower extremities   Data Reviewed: Basic Metabolic Panel:  Recent Labs Lab 03/29/15 0541 03/30/15 0932 03/31/15 0303 04/01/15 1731 04/02/15 1118 04/03/15 0915  NA 137 134* 133* 128* 132* 132*  K 5.1 5.1 5.1 6.0* 6.2* 4.9  CL 93* 93* 92*  --  91* 94*  CO2 24 19* 21*  --  22 23  GLUCOSE 99 128* 92 80 84 86  BUN 90* 116* 85*  --  104* 73*  CREATININE 12.55* 14.27* 11.49*  --  13.33* 10.41*  CALCIUM 7.7* 7.6* 7.4*  --  6.8* 7.1*  MG  --   --   --   --  1.8  --   PHOS  --  11.6* 10.4*  --   --  10.1*    CBC:  Recent Labs Lab 03/29/15 0050 03/29/15 0541 03/30/15 0932 04/01/15 1731 04/02/15 1345 04/03/15 0915  WBC 12.2* 11.9* 12.6*  --  13.9* 14.0*  NEUTROABS  --   --   --   --  11.6*  --   HGB 10.1* 10.6* 9.7* 11.2* 8.5* 8.3*  HCT 30.0* 32.9* 29.9* 33.0* 26.0* 25.3*  MCV 83.3 84.6 83.8  --  85.8 85.5  PLT 258 246 278  --  301 288    Liver Function Tests:  Recent Labs Lab 03/30/15 0932 03/31/15 0303 04/02/15 1118 04/03/15 0915  AST 22  --  34  --   ALT 25  --   28  --   ALKPHOS 112  --  128*  --   BILITOT 0.4  --  0.5  --   PROT 4.8*  --  4.2*  --   ALBUMIN 1.4* 1.5* 1.3* 1.5*   Coags:  Recent Labs Lab 03/29/15 0541  INR 1.26   Cardiac Enzymes:  Recent Labs Lab 03/28/15 1450 03/29/15 0050 03/29/15 0541 03/29/15 1345  TROPONINI 1.13* 0.74* 0.73* 0.65*    Recent Results (from the past 240 hour(s))  MRSA PCR Screening     Status: None   Collection Time: 03/29/15 12:01 AM  Result Value Ref Range Status   MRSA by PCR NEGATIVE NEGATIVE Final    Comment:        The GeneXpert MRSA Assay (FDA approved for NASAL specimens only), is one component of a comprehensive MRSA colonization surveillance program. It is  not intended to diagnose MRSA infection nor to guide or monitor treatment for MRSA infections.   Wound culture     Status: None   Collection Time: 03/29/15 12:50 AM  Result Value Ref Range Status   Specimen Description WOUND LEFT THIGH  Final   Special Requests NONE  Final   Gram Stain   Final    NO WBC SEEN NO SQUAMOUS EPITHELIAL CELLS SEEN NO ORGANISMS SEEN Performed at [facility]    Culture   Final    MULTIPLE ORGANISMS PRESENT, NONE PREDOMINANT Note: NO STAPHYLOCOCCUS AUREUS ISOLATED NO GROUP A STREP (S.PYOGENES) ISOLATED Performed at [facility]    Report Status 03/31/2015 FINAL  Final  Culture, blood (routine x 2)     Status: None (Preliminary result)   Collection Time: 03/29/15 12:55 AM  Result Value Ref Range Status   Specimen Description BLOOD HEMODIALYSIS CATHETER  Final   Special Requests BOTTLES DRAWN AEROBIC AND ANAEROBIC 10CC  Final   Culture   Final           BLOOD CULTURE RECEIVED NO GROWTH TO DATE CULTURE WILL BE HELD FOR 5 DAYS BEFORE ISSUING A FINAL NEGATIVE REPORT Performed at [facility]    Report Status PENDING  Incomplete  Culture, blood (routine x 2)     Status: None (Preliminary result)   Collection Time: 03/29/15  1:45 AM  Result Value Ref Range Status    Specimen Description BLOOD HEMODIALYSIS CATHETER  Final   Special Requests BOTTLES DRAWN AEROBIC AND ANAEROBIC 10CC  Final   Culture   Final           BLOOD CULTURE RECEIVED NO GROWTH TO DATE CULTURE WILL BE HELD FOR 5 DAYS BEFORE ISSUING A FINAL NEGATIVE REPORT Performed at [facility]    Report Status PENDING  Incomplete  Body fluid culture     Status: None (Preliminary result)   Collection Time: 04/02/15  6:35 PM  Result Value Ref Range Status   Specimen Description FLUID  Final   Special Requests PERICARDIAL  Final   Gram Stain   Final    RARE WBC PRESENT, PREDOMINANTLY PMN NO ORGANISMS SEEN Performed at [facility]    Culture NO GROWTH Performed at [facility]   Final   Report Status PENDING  Incomplete     Studies:   Recent x-ray studies have been reviewed in detail by the Attending Physician  Scheduled Meds:  Scheduled Meds: . calcium acetate  667 mg Oral TID WC  . darbepoetin (ARANESP) injection - DIALYSIS  60 mcg Intravenous Q Wed-HD  . midodrine  5 mg Oral TID WC  . multivitamin  1 tablet Oral QHS  . senna-docusate  2 tablet Oral BID  . sodium chloride  3 mL Intravenous Q12H  . sodium chloride  3 mL Intravenous Q12H    Time spent on care of this patient: 25 mins   Audie Wieser T , MD   [facility] Office  [phone] Pager - Text Page per Loretha Stapler as per below:  On-Call/Text Page:      [email or web address]      password [email or web address]  If 7PM-7AM, please contact night-coverage [email or web address] Password [email or web address] 04/03/2015, 5:48 PM   LOS: 6 days

## 2015-04-03 NOTE — Progress Notes (Signed)
  [facility] Progress Note   Subjective: had pericardiocentesis yest, details pending. On HD now, BP 92/43, no SOB or CP  Filed Vitals:   04/03/15 0857 04/03/15 0900 04/03/15 0930 04/03/15 1030  BP: 65/50 86/48 85/46  92/43  Pulse: 110 107 114 107  Temp:      TempSrc:      Resp: 29 26 24 25   Height:      Weight:      SpO2:       Exam: Alert, on HD, [place] O2 No jvd Chest decreased at bases, o/w clear RRR tachy no rub or gallop Abd +ascites moderate, nontender L thigh AVG patent, +bruit, using medial limb x 4 wks 1+ bilat LE edema  CXR 6/9 mild edema and bilat effusions ECHO 6/10 - EF 60%, RV mild dilated, IVC dilated c/w ^CVP. Large circumferential pericardial effusion  [facility] MWF 4 hr (rarely runs > 1.5 hours) 1 K 2.5 Ca left thigh AVGG NO heparin Refuses Meds most of the time, EDW 72.5       Assessment: 1. Pericardial effusion/ tamponade s/p drainage 6/14 2. Vol excess up 10kg by wts , last cxr mild edema on 6/9 3. Hypotension not much better yet 4. Chronic pulm HTN/ ascites / recur paracentesis 5. AVG revision 6/13 use medial limb x 4wks 6. ESRD HD MWF chronic underdialysis d/t malcompliance 7. Anemia down to 8, start ESA 8. MBD cont phoslo 9. Nutrition w very low alb  Plan - HD today    Vinson Moselle MD  pager [phone]    cell [phone]  04/03/2015, 10:45 AM     Recent Labs Lab 03/30/15 0932 03/31/15 0303 04/01/15 1731 04/02/15 1118 04/03/15 0915  NA 134* 133* 128* 132* 132*  K 5.1 5.1 6.0* 6.2* 4.9  CL 93* 92*  --  91* 94*  CO2 19* 21*  --  22 23  GLUCOSE 128* 92 80 84 86  BUN 116* 85*  --  104* 73*  CREATININE 14.27* 11.49*  --  13.33* 10.41*  CALCIUM 7.6* 7.4*  --  6.8* 7.1*  PHOS 11.6* 10.4*  --   --  10.1*    Recent Labs Lab 03/30/15 0932 03/31/15 0303 04/02/15 1118 04/03/15 0915  AST 22  --  34  --   ALT 25  --  28  --   ALKPHOS 112  --  128*  --   BILITOT 0.4  --  0.5  --   PROT 4.8*  --  4.2*  --   ALBUMIN 1.4* 1.5*  1.3* 1.5*    Recent Labs Lab 03/30/15 0932 04/01/15 1731 04/02/15 1345 04/03/15 0915  WBC 12.6*  --  13.9* 14.0*  NEUTROABS  --   --  11.6*  --   HGB 9.7* 11.2* 8.5* 8.3*  HCT 29.9* 33.0* 26.0* 25.3*  MCV 83.8  --  85.8 85.5  PLT 278  --  301 288   . calcium acetate  667 mg Oral TID WC  . midodrine  5 mg Oral TID WC  . multivitamin  1 tablet Oral QHS  . senna-docusate  2 tablet Oral BID  . sodium chloride  3 mL Intravenous Q12H  . sodium chloride  3 mL Intravenous Q12H     sodium chloride, sodium chloride, acetaminophen, fentaNYL (SUBLIMAZE) injection, ondansetron (ZOFRAN) IV, phenol, sodium chloride, sodium chloride, zolpidem

## 2015-04-03 NOTE — Progress Notes (Signed)
Subjective:  Pain at pericardial drain site.  Objective:  Temp:  [97.8 F (36.6 C)-98.1 F (36.7 C)] 97.9 F (36.6 C) (06/15 1128) Pulse Rate:  [0-121] 110 (06/15 1315) Resp:  [0-46] 23 (06/15 1315) BP: (65-142)/(41-92) 83/47 mmHg (06/15 1300) SpO2:  [0 %-100 %] 100 % (06/15 1315) Weight:  [186 lb 15.2 oz (84.8 kg)] 186 lb 15.2 oz (84.8 kg) (06/14 1505) Weight change:   Intake/Output from previous day: 06/14 0701 - 06/15 0700 In: 30  Out: 72 [Drains:120]  Intake/Output from this shift: Total I/O In: 20 [Other:20] Out: 833 [Drains:40; Other:793]  Physical Exam: General appearance: alert and mild distress Neck: no adenopathy, no carotid bruit, no JVD, supple, symmetrical, trachea midline and thyroid not enlarged, symmetric, no tenderness/mass/nodules Lungs: clear to auscultation bilaterally Heart: regular rate and rhythm, S1, S2 normal, no murmur, click, rub or gallop Extremities: extremities normal, atraumatic, no cyanosis or edema  Lab Results: Results for orders placed or performed during the hospital encounter of 03/28/15 (from the past 48 hour(s))  I-STAT 4, (NA,K, GLUC, HGB,HCT)     Status: Abnormal   Collection Time: 04/01/15  5:31 PM  Result Value Ref Range   Sodium 128 (L) 135 - 145 mmol/L   Potassium 6.0 (H) 3.5 - 5.1 mmol/L   Glucose, Bld 80 65 - 99 mg/dL   HCT 33.0 (L) 36.0 - 46.0 %   Hemoglobin 11.2 (L) 12.0 - 15.0 g/dL  Comprehensive metabolic panel     Status: Abnormal   Collection Time: 04/02/15 11:18 AM  Result Value Ref Range   Sodium 132 (L) 135 - 145 mmol/L   Potassium 6.2 (HH) 3.5 - 5.1 mmol/L    Comment: REPEATED TO VERIFY CRITICAL RESULT CALLED TO, READ BACK BY AND VERIFIED WITH: CHRISTINA YOUNG,RN AT 1337 04/02/15 BY ZBEECH.    Chloride 91 (L) 101 - 111 mmol/L   CO2 22 22 - 32 mmol/L   Glucose, Bld 84 65 - 99 mg/dL   BUN 104 (H) 6 - 20 mg/dL   Creatinine, Ser 13.33 (H) 0.44 - 1.00 mg/dL   Calcium 6.8 (L) 8.9 - 10.3 mg/dL   Total  Protein 4.2 (L) 6.5 - 8.1 g/dL   Albumin 1.3 (L) 3.5 - 5.0 g/dL   AST 34 15 - 41 U/L   ALT 28 14 - 54 U/L   Alkaline Phosphatase 128 (H) 38 - 126 U/L   Total Bilirubin 0.5 0.3 - 1.2 mg/dL   GFR calc non Af Amer 3 (L) >60 mL/min   GFR calc Af Amer 3 (L) >60 mL/min    Comment: (NOTE) The eGFR has been calculated using the CKD EPI equation. This calculation has not been validated in all clinical situations. eGFR's persistently <60 mL/min signify possible Chronic Kidney Disease.    Anion gap 19 (H) 5 - 15  Cortisol-am, blood     Status: Abnormal   Collection Time: 04/02/15 11:18 AM  Result Value Ref Range   Cortisol - AM 23.3 (H) 6.7 - 22.6 ug/dL  Magnesium     Status: None   Collection Time: 04/02/15 11:18 AM  Result Value Ref Range   Magnesium 1.8 1.7 - 2.4 mg/dL  TSH     Status: Abnormal   Collection Time: 04/02/15 11:18 AM  Result Value Ref Range   TSH 26.906 (H) 0.350 - 4.500 uIU/mL  CBC with Differential/Platelet     Status: Abnormal   Collection Time: 04/02/15  1:45 PM  Result Value Ref  Range   WBC 13.9 (H) 4.0 - 10.5 K/uL   RBC 3.03 (L) 3.87 - 5.11 MIL/uL   Hemoglobin 8.5 (L) 12.0 - 15.0 g/dL    Comment: RESULT REPEATED AND VERIFIED   HCT 26.0 (L) 36.0 - 46.0 %   MCV 85.8 78.0 - 100.0 fL   MCH 28.1 26.0 - 34.0 pg   MCHC 32.7 30.0 - 36.0 g/dL   RDW 15.0 11.5 - 15.5 %   Platelets 301 150 - 400 K/uL   Neutrophils Relative % 83 (H) 43 - 77 %   Neutro Abs 11.6 (H) 1.7 - 7.7 K/uL   Lymphocytes Relative 6 (L) 12 - 46 %   Lymphs Abs 0.9 0.7 - 4.0 K/uL   Monocytes Relative 10 3 - 12 %   Monocytes Absolute 1.4 (H) 0.1 - 1.0 K/uL   Eosinophils Relative 1 0 - 5 %   Eosinophils Absolute 0.1 0.0 - 0.7 K/uL   Basophils Relative 0 0 - 1 %   Basophils Absolute 0.0 0.0 - 0.1 K/uL  Body fluid cell count with differential     Status: Abnormal   Collection Time: 04/02/15  6:35 PM  Result Value Ref Range   Fluid Type-FCT PERICARDIAL     Comment: CORRECTED ON 06/14 AT 2016:  PREVIOUSLY REPORTED AS Body Fluid   Color, Fluid RED (A) YELLOW   Appearance, Fluid BLOODY (A) CLEAR   WBC, Fluid 1094 (H) 0 - 1000 cu mm   Neutrophil Count, Fluid 80 (H) 0 - 25 %   Lymphs, Fluid 14 %   Monocyte-Macrophage-Serous Fluid 6 (L) 50 - 90 %   Eos, Fluid NONE SEEN %  Body fluid culture     Status: None (Preliminary result)   Collection Time: 04/02/15  6:35 PM  Result Value Ref Range   Specimen Description FLUID    Special Requests PERICARDIAL    Gram Stain      RARE WBC PRESENT, PREDOMINANTLY PMN NO ORGANISMS SEEN Performed at [facility]    Culture NO GROWTH Performed at [facility]     Report Status PENDING   Amylase, Peritoneal Fluid     Status: None   Collection Time: 04/02/15  6:35 PM  Result Value Ref Range   Amylase, peritoneal fluid 9 U/L    Comment: (NOTE) Reference range: Values greater than or equal to three times a simultaneously analyzed serum value are considered abnormal in ascites of pancreatic origin. Other peritoneal and intestional disorders may also cause elevation of amylase. Performed at [facility]   Renal function panel     Status: Abnormal   Collection Time: 04/03/15  9:15 AM  Result Value Ref Range   Sodium 132 (L) 135 - 145 mmol/L   Potassium 4.9 3.5 - 5.1 mmol/L   Chloride 94 (L) 101 - 111 mmol/L   CO2 23 22 - 32 mmol/L   Glucose, Bld 86 65 - 99 mg/dL   BUN 73 (H) 6 - 20 mg/dL   Creatinine, Ser 10.41 (H) 0.44 - 1.00 mg/dL   Calcium 7.1 (L) 8.9 - 10.3 mg/dL   Phosphorus 10.1 (H) 2.5 - 4.6 mg/dL   Albumin 1.5 (L) 3.5 - 5.0 g/dL   GFR calc non Af Amer 4 (L) >60 mL/min   GFR calc Af Amer 5 (L) >60 mL/min    Comment: (NOTE) The eGFR has been calculated using the CKD EPI equation. This calculation has not been validated in all clinical situations. eGFR's persistently <60  mL/min signify possible Chronic Kidney Disease.    Anion gap 15 5 - 15  CBC     Status: Abnormal   Collection Time: 04/03/15  9:15 AM   Result Value Ref Range   WBC 14.0 (H) 4.0 - 10.5 K/uL   RBC 2.96 (L) 3.87 - 5.11 MIL/uL   Hemoglobin 8.3 (L) 12.0 - 15.0 g/dL   HCT 25.3 (L) 36.0 - 46.0 %   MCV 85.5 78.0 - 100.0 fL   MCH 28.0 26.0 - 34.0 pg   MCHC 32.8 30.0 - 36.0 g/dL   RDW 14.9 11.5 - 15.5 %   Platelets 288 150 - 400 K/uL    Imaging: Imaging results have been reviewed  Assessment/Plan:   1. Principal Problem: 2.   Adjustment disorder with mixed anxiety and depressed mood 3. Active Problems: 4.   ESRD (end stage renal disease) on dialysis 5.   Ascites 6.   Hyperkalemia 7.   Troponin level elevated 8.   Chest pain with high risk for cardiac etiology 9.   Acute on chronic diastolic CHF (congestive heart failure), NYHA class 4 10.   ESRD (end stage renal disease) 11.   Palliative care encounter 12.   Elevated troponin I level 13.   Noncompliance 14.   Pericardial effusion 15.   Acute on chronic diastolic CHF (congestive heart failure) 16.   Elevated troponin 17.   Cirrhosis 18.   End stage renal disease on dialysis 19.   Other specified hypotension 20.   Hypervolemia 21.   Pericardial tamponade 22.   Time Spent Directly with Patient:  15 minutes  Length of Stay:  LOS: 6 days   S/P pericardiocentesis yesterday 650 cc serosanguinous fluid. Drain in place. Mild drainage. Will pull tomorrow. BP soft 90/60. Had HD today. Otherwise no changes. Will re check 2D in 48 hours  Quay Burow 04/03/2015, 1:54 PM

## 2015-04-03 NOTE — Progress Notes (Signed)
Patient received a 2.75 hour treatment. Patient was ordered a 4 hour treatment. Patient refused to complete her ordered treatment. Patient signed off AMA. Patient was urged to continue her treatment due to her fluid status.

## 2015-04-03 NOTE — Progress Notes (Signed)
Pt states her pain is 9/10. Spoke with Dr. Allyson Sabal about administering fentanyl with SBP in 80s. He states it is ok to give. Will continue to monitor closely.

## 2015-04-03 NOTE — Progress Notes (Signed)
Pt c/o chest pain 10/10 at pericardial drain site.  BP 95/54 HR 120s. Pericardial drain difficult to flush. Dr Shirlee Latch notified. Will continue to monitor.

## 2015-04-04 ENCOUNTER — Inpatient Hospital Stay (HOSPITAL_COMMUNITY): Payer: Medicare Other

## 2015-04-04 ENCOUNTER — Encounter (HOSPITAL_COMMUNITY)
Admission: EM | Disposition: A | Discharge status: Home / Self Care | Payer: Medicare Other | Source: Home / Self Care | Attending: Internal Medicine

## 2015-04-04 DIAGNOSIS — R079 Chest pain, unspecified: Secondary | ICD-10-CM

## 2015-04-04 DIAGNOSIS — Z515 Encounter for palliative care: Secondary | ICD-10-CM

## 2015-04-04 DIAGNOSIS — I314 Cardiac tamponade: Secondary | ICD-10-CM

## 2015-04-04 DIAGNOSIS — I214 Non-ST elevation (NSTEMI) myocardial infarction: Secondary | ICD-10-CM | POA: Diagnosis present

## 2015-04-04 DIAGNOSIS — I509 Heart failure, unspecified: Secondary | ICD-10-CM | POA: Diagnosis present

## 2015-04-04 HISTORY — PX: CARDIAC CATHETERIZATION: SHX172

## 2015-04-04 LAB — CULTURE, BLOOD (ROUTINE X 2)
Culture: NO GROWTH
Culture: NO GROWTH

## 2015-04-04 SURGERY — PERICARDIOCENTESIS
Anesthesia: LOCAL

## 2015-04-04 MED ORDER — FENTANYL CITRATE (PF) 100 MCG/2ML IJ SOLN
INTRAMUSCULAR | Status: AC
  Filled 2015-04-04: qty 2

## 2015-04-04 MED ORDER — MIDAZOLAM HCL 2 MG/2ML IJ SOLN
INTRAMUSCULAR | Status: AC
  Filled 2015-04-04: qty 2

## 2015-04-04 MED ORDER — FENTANYL CITRATE (PF) 100 MCG/2ML IJ SOLN
INTRAMUSCULAR | Status: DC | PRN
  Administered 2015-04-04: 25 ug via INTRAVENOUS

## 2015-04-04 MED ORDER — MIDAZOLAM HCL 2 MG/2ML IJ SOLN
INTRAMUSCULAR | Status: DC | PRN
  Administered 2015-04-04: 1 mg via INTRAVENOUS

## 2015-04-04 MED ORDER — LIDOCAINE HCL (PF) 1 % IJ SOLN
INTRAMUSCULAR | Status: DC | PRN
  Administered 2015-04-04: 30 mL via SUBCUTANEOUS

## 2015-04-04 MED ORDER — HEPARIN (PORCINE) IN NACL 2-0.9 UNIT/ML-% IJ SOLN
INTRAMUSCULAR | Status: AC
  Filled 2015-04-04: qty 500

## 2015-04-04 MED ORDER — LIDOCAINE HCL (PF) 1 % IJ SOLN
INTRAMUSCULAR | Status: AC
  Filled 2015-04-04: qty 30

## 2015-04-04 SURGICAL SUPPLY — 5 items
PACK CARDIAC CATHETERIZATION (CUSTOM PROCEDURE TRAY) ×2 IMPLANT
PROTECTION STATION PRESSURIZED (MISCELLANEOUS) ×2
STATION PROTECTION PRESSURIZED (MISCELLANEOUS) ×1 IMPLANT
WIRE EMERALD 3MM-J .035X150CM (WIRE) ×2 IMPLANT
WIRE HI TORQ VERSACORE-J 145CM (WIRE) ×2 IMPLANT

## 2015-04-04 NOTE — Progress Notes (Signed)
Subjective:  Major complaint is pain around the pericardial drain.  Objective:  Temp:  [97.7 F (36.5 C)-98.2 F (36.8 C)] 98.1 F (36.7 C) (06/16 0800) Pulse Rate:  [38-117] 110 (06/16 0800) Resp:  [9-30] 16 (06/16 0800) BP: (65-112)/(31-70) 93/43 mmHg (06/16 0800) SpO2:  [75 %-100 %] 97 % (06/16 0800) Weight:  [186 lb 15.2 oz (84.8 kg)-188 lb 0.8 oz (85.3 kg)] 186 lb 15.2 oz (84.8 kg) (06/15 1128) Weight change: 1 lb 8.7 oz (0.7 kg)  Intake/Output from previous day: 06/15 0701 - 06/16 0700 In: 399 [P.O.:300; I.V.:49] Out: 873 [Drains:80]  Intake/Output from this shift: Total I/O In: 10 [Other:10] Out: 30 [Drains:30]  Physical Exam: General appearance: alert and mild distress Neck: no adenopathy, no carotid bruit, no JVD, supple, symmetrical, trachea midline and thyroid not enlarged, symmetric, no tenderness/mass/nodules Lungs: clear to auscultation bilaterally Heart: Tachy with 2 componant rub Extremities: extremities normal, atraumatic, no cyanosis or edema  Lab Results: Results for orders placed or performed during the hospital encounter of 03/28/15 (from the past 48 hour(s))  Comprehensive metabolic panel     Status: Abnormal   Collection Time: 04/02/15 11:18 AM  Result Value Ref Range   Sodium 132 (L) 135 - 145 mmol/L   Potassium 6.2 (HH) 3.5 - 5.1 mmol/L    Comment: REPEATED TO VERIFY CRITICAL RESULT CALLED TO, READ BACK BY AND VERIFIED WITH: CHRISTINA YOUNG,RN AT 5465 04/02/15 BY ZBEECH.    Chloride 91 (L) 101 - 111 mmol/L   CO2 22 22 - 32 mmol/L   Glucose, Bld 84 65 - 99 mg/dL   BUN 104 (H) 6 - 20 mg/dL   Creatinine, Ser 13.33 (H) 0.44 - 1.00 mg/dL   Calcium 6.8 (L) 8.9 - 10.3 mg/dL   Total Protein 4.2 (L) 6.5 - 8.1 g/dL   Albumin 1.3 (L) 3.5 - 5.0 g/dL   AST 34 15 - 41 U/L   ALT 28 14 - 54 U/L   Alkaline Phosphatase 128 (H) 38 - 126 U/L   Total Bilirubin 0.5 0.3 - 1.2 mg/dL   GFR calc non Af Amer 3 (L) >60 mL/min   GFR calc Af Amer 3 (L) >60  mL/min    Comment: (NOTE) The eGFR has been calculated using the CKD EPI equation. This calculation has not been validated in all clinical situations. eGFR's persistently <60 mL/min signify possible Chronic Kidney Disease.    Anion gap 19 (H) 5 - 15  Cortisol-am, blood     Status: Abnormal   Collection Time: 04/02/15 11:18 AM  Result Value Ref Range   Cortisol - AM 23.3 (H) 6.7 - 22.6 ug/dL  Magnesium     Status: None   Collection Time: 04/02/15 11:18 AM  Result Value Ref Range   Magnesium 1.8 1.7 - 2.4 mg/dL  TSH     Status: Abnormal   Collection Time: 04/02/15 11:18 AM  Result Value Ref Range   TSH 26.906 (H) 0.350 - 4.500 uIU/mL  CBC with Differential/Platelet     Status: Abnormal   Collection Time: 04/02/15  1:45 PM  Result Value Ref Range   WBC 13.9 (H) 4.0 - 10.5 K/uL   RBC 3.03 (L) 3.87 - 5.11 MIL/uL   Hemoglobin 8.5 (L) 12.0 - 15.0 g/dL    Comment: RESULT REPEATED AND VERIFIED   HCT 26.0 (L) 36.0 - 46.0 %   MCV 85.8 78.0 - 100.0 fL   MCH 28.1 26.0 - 34.0 pg   MCHC 32.7  30.0 - 36.0 g/dL   RDW 15.0 11.5 - 15.5 %   Platelets 301 150 - 400 K/uL   Neutrophils Relative % 83 (H) 43 - 77 %   Neutro Abs 11.6 (H) 1.7 - 7.7 K/uL   Lymphocytes Relative 6 (L) 12 - 46 %   Lymphs Abs 0.9 0.7 - 4.0 K/uL   Monocytes Relative 10 3 - 12 %   Monocytes Absolute 1.4 (H) 0.1 - 1.0 K/uL   Eosinophils Relative 1 0 - 5 %   Eosinophils Absolute 0.1 0.0 - 0.7 K/uL   Basophils Relative 0 0 - 1 %   Basophils Absolute 0.0 0.0 - 0.1 K/uL  Body fluid cell count with differential     Status: Abnormal   Collection Time: 04/02/15  6:35 PM  Result Value Ref Range   Fluid Type-FCT PERICARDIAL     Comment: CORRECTED ON 06/14 AT 2016: PREVIOUSLY REPORTED AS Body Fluid   Color, Fluid RED (A) YELLOW   Appearance, Fluid BLOODY (A) CLEAR   WBC, Fluid 1094 (H) 0 - 1000 cu mm   Neutrophil Count, Fluid 80 (H) 0 - 25 %   Lymphs, Fluid 14 %   Monocyte-Macrophage-Serous Fluid 6 (L) 50 - 90 %   Eos, Fluid  NONE SEEN %  Body fluid culture     Status: None (Preliminary result)   Collection Time: 04/02/15  6:35 PM  Result Value Ref Range   Specimen Description FLUID    Special Requests PERICARDIAL    Gram Stain      RARE WBC PRESENT, PREDOMINANTLY PMN NO ORGANISMS SEEN Performed at [facility]    Culture NO GROWTH Performed at [facility]     Report Status PENDING   Amylase, Peritoneal Fluid     Status: None   Collection Time: 04/02/15  6:35 PM  Result Value Ref Range   Amylase, peritoneal fluid 9 U/L    Comment: (NOTE) Reference range: Values greater than or equal to three times a simultaneously analyzed serum value are considered abnormal in ascites of pancreatic origin. Other peritoneal and intestional disorders may also cause elevation of amylase. Performed at [facility]   Renal function panel     Status: Abnormal   Collection Time: 04/03/15  9:15 AM  Result Value Ref Range   Sodium 132 (L) 135 - 145 mmol/L   Potassium 4.9 3.5 - 5.1 mmol/L   Chloride 94 (L) 101 - 111 mmol/L   CO2 23 22 - 32 mmol/L   Glucose, Bld 86 65 - 99 mg/dL   BUN 73 (H) 6 - 20 mg/dL   Creatinine, Ser 10.41 (H) 0.44 - 1.00 mg/dL   Calcium 7.1 (L) 8.9 - 10.3 mg/dL   Phosphorus 10.1 (H) 2.5 - 4.6 mg/dL   Albumin 1.5 (L) 3.5 - 5.0 g/dL   GFR calc non Af Amer 4 (L) >60 mL/min   GFR calc Af Amer 5 (L) >60 mL/min    Comment: (NOTE) The eGFR has been calculated using the CKD EPI equation. This calculation has not been validated in all clinical situations. eGFR's persistently <60 mL/min signify possible Chronic Kidney Disease.    Anion gap 15 5 - 15  CBC     Status: Abnormal   Collection Time: 04/03/15  9:15 AM  Result Value Ref Range   WBC 14.0 (H) 4.0 - 10.5 K/uL   RBC 2.96 (L) 3.87 - 5.11 MIL/uL   Hemoglobin 8.3 (L) 12.0 - 15.0 g/dL  HCT 25.3 (L) 36.0 - 46.0 %   MCV 85.5 78.0 - 100.0 fL   MCH 28.0 26.0 - 34.0 pg   MCHC 32.8 30.0 - 36.0 g/dL   RDW 14.9 11.5 - 15.5 %    Platelets 288 150 - 400 K/uL    Imaging: Imaging results have been reviewed  Assessment/Plan:   1. Active Problems: 2.   HTN (hypertension) 3.   Ascites 4.   Hyperkalemia 5.   Chest pain with high risk for cardiac etiology 6.   Palliative care encounter 7.   Noncompliance 8.   Adjustment disorder with mixed anxiety and depressed mood 9.   Acute on chronic diastolic CHF (congestive heart failure) 10.   Elevated troponin 11.   Cirrhosis 12.   End stage renal disease on dialysis 13.   Other specified hypotension 14.   Pericardial tamponade 15.   Time Spent Directly with Patient:  20 minutes  Length of Stay:  LOS: 7 days   Day # 2 pericardial drain. 10 cc out over last shift. I attempted to pull out but met with increased resistance. Will attempt under fluoro guidance. I've discussed with Dr. Irish Lack. For HD tomorrow. Will get repeat 2D tomorrow. Can prob Tx stepdown.  Quay Burow 04/04/2015, 8:21 AM

## 2015-04-04 NOTE — Progress Notes (Signed)
  Echocardiogram 2D Echocardiogram has been performed.  Janalyn Harder 04/04/2015, 3:05 PM

## 2015-04-04 NOTE — Progress Notes (Signed)
Daily Progress Note   Patient Name: Holly Mendoza       Date: 04/04/2015 DOB: Dec 25, 1970  Age: 44 y.o. MRN#: [id number] Attending Physician: Drema Dallas, MD Primary Care Physician: Dorrene German, MD Admit Date: 03/28/2015  Reason for Consultation/Follow-up: Establishing goals of care  Subjective:     Holly Mendoza is lying in bed much unchanged. Still not completed full HD session. Plan for d/c pericardial drain today. Long discussion regarding her goals and the fact that HD is not working for her and I do not expect this will change. Discussed:  - Code status: she says she would want "to try once but not a second time." Encouraged her to consider this as if she were to live through a code she would be in worse condition than she is now  - Dialysis: She has been contemplating this and is not afraid to die "I know I am going to heaven." She is afraid of not being able to breath and I addressed how this can be properly managed with comfort care. She would like to talk to her family about this first - I don't know if she is prepared to make this decision now. Will try to arrange meeting with myself and her family to discuss further.   - HCPOA: She says she would want her aunt- Holly Mendoza to make decisions for her. She would like to talk to [facility] first and complete HCPOA paperwork.    Length of Stay: 7 days  Current Medications: Scheduled Meds:  . calcium acetate  667 mg Oral TID WC  . darbepoetin (ARANESP) injection - DIALYSIS  60 mcg Intravenous Q Wed-HD  . midodrine  10 mg Oral TID WC  . multivitamin  1 tablet Oral QHS  . senna-docusate  2 tablet Oral BID    Continuous Infusions:    PRN Meds: acetaminophen, fentaNYL (SUBLIMAZE) injection, ondansetron (ZOFRAN) IV, phenol, zolpidem  Palliative Performance Scale: 50%     Vital Signs: BP 78/48 mmHg  Pulse 112  Temp(Src) 98.1 F (36.7 C) (Oral)  Resp 30  Ht 5' 9.5" (1.765 m)  Wt 84.8 kg (186 lb 15.2 oz)  BMI 27.22 kg/m2   SpO2 99% SpO2: SpO2: 99 % O2 Device: O2 Device: Nasal Cannula O2 Flow Rate: O2 Flow Rate (L/min): 4 L/min  Intake/output summary:  Intake/Output Summary (Last 24 hours) at 04/04/15 1002 Last data filed at 04/04/15 0800  Gross per 24 hour  Intake    399 ml  Output    883 ml  Net   -484 ml   LBM: Last BM Date: 04/01/15 Baseline Weight: Weight: 76.658 kg (169 lb) Most recent weight: Weight: 84.8 kg (186 lb 15.2 oz)  Physical Exam: General: NAD, lying in bed HEENT: [place]/AT, moist mucous membranes CVS: RRR Resp: No labored breathing Abd: Distended Neuro: Awake, alert, oriented x 3    Additional Data Reviewed: Recent Labs     04/02/15  1118  04/02/15  1345  04/03/15  0915  WBC   --   13.9*  14.0*  HGB   --   8.5*  8.3*  PLT   --   301  288  NA  132*   --   132*  BUN  104*   --   73*  CREATININE  13.33*   --   10.41*     Problem List:  Patient Active Problem List   Diagnosis Date Noted  . Acute on chronic diastolic CHF (congestive heart failure)   .  Elevated troponin   . Cirrhosis   . End stage renal disease on dialysis   . Other specified hypotension   . Pericardial tamponade   . Adjustment disorder with mixed anxiety and depressed mood 04/01/2015  . Noncompliance   . Pericardial effusion   . Palliative care encounter   . Chest pain with high risk for cardiac etiology 03/28/2015  . Hyperkalemia 12/19/2014  . Abdominal pain 12/19/2014  . Anemia of renal disease 08/31/2014  . Ascites 07/11/2014  . 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 Volume overload 05/11/2014  . Pulmonary edema 05/07/2014  . HTN (hypertension) 04/24/2014  . Prolonged Q-T interval on ECG 04/24/2014  . Hx of ascites 04/20/2014     Palliative Care Assessment & Plan    Code Status:  FULL - will plan to readdress tomorrow  Goals of Care:  Considering stopping dialysis - we discussed how this is not working and not likely to get better for her.     Encouraged to complete HCPOA and talk with family about her goals.  3. Symptom Management:  Chest pain around pericardial drain: Awaiting removal.     4. Prognosis: Unable to determine - poor and very fragile - days to weeks with stopping dialysis.   5. Discharge Planning: To be determined - encouraged her to consider hospice facility if stopping dialysis.     Thank you for allowing the Palliative Medicine Team to assist in the care of this patient.   Time In: 0920 Time Out: 1000 Total Time Prolonged Time Billed  no     Greater than 50%  of this time was spent counseling and coordinating care related to the above assessment and plan.     Yong Channel, NP Palliative Medicine Team Pager # [phone] (M-F 8a-5p) Team Phone # [phone] (Nights/Weekends)  04/04/2015, 10:02 AM

## 2015-04-04 NOTE — Progress Notes (Signed)
[facility] TEAM 1 - Stepdown/ICU TEAM Progress Note  Holly Mendoza JYN:[id number] DOB: 1971/03/01 DOA: 03/28/2015 PCP: Dorrene German, MD  Admit HPI / Brief Narrative: 44 y.o. 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  Presented to the emergency department c/o cough and shortness of breath. Patient reported progressively worsening shortness of breath over a week. She was given levaquin for possible pna but reported she did not take it. She reported constant chest pain for a week, and worsening of chronic ascites. She reported miss her last dialysis.  In the ED course she was found to be markedly fluid overloaded, elevation of troponin, mild elevation of potassium, and EKG with ? Mobitz I with QT prolongation of 520. Nephro was consulted, and patient initially agreed with emergent dialysis. She did not wish to have paracentesis done, She did not wish to have aggressive cardiac intervention however desired to remain full code.   HPI/Subjective: 6/16 A/O 4, complains of chest wall pain (location of pericardial catheter). No further inpatient HD. Plus increasing ascites, patient stated usually has paracentesis every couple weeks at [facility]   Assessment/Plan: Severe volume overload due to noncompliance w/ HD -No further inpatient HD  -Continue Midodrin 10 mg TID; patient still hypotensive -Palliative care to discuss with patient and family in a.m. transitioning to hospice.  Large pericardial effusion -S/P pericardiocentesis which obtained 650 ml serosanguineous fluid drained -Difficulty removing pericardiocentesis catheter; patient to cath lab for removal under fluoroscopy today   HTN/Hyotension -Patient currently hypotensive continue Midodrine 10 mg TID  ESRD w/ noncompliance w/ HD - Psych has seen patient and states she has capacity/is competent to make her own  decisions  -Revision left thigh graft 6/14; per nephrology able start using graft right away. However no further inpatient HD planned  Hyperkalemia due to noncompliance w/ HD -Currently high normal however will trend up without HD   Acute on chronic diastolic congestive heart failure -Secondary to patient's poor BP unable to use CHF medication  Elevated troponin - NSTEMI -Refused planned cardiac cath 6/10 - this is the second time she has been advised to have cath and refused on the day of the procedure (also March 2016)  - Cardiology continues to follow  Cirrhosis / chronic ascites  -Increase in ascites, prior to discharge may want to perform therapeutic paracentesis.      Code Status: FULL Family Communication: None present at time of exam Disposition Plan: Hospice?    Consultants: Dr. Delano Metz (nephrology) Dr. Sherren Kerns (vascular surgery) NP Ulice Bold (palliative care)   Procedure/Significant Events: 6/10 echocardiogram;- LVEF=60%. - Right ventricle: Mild dilitation. No RV free wall diastolic collapse. - Inferior vena cava: The vessel was dilated. The respirophasic diameter changes were blunted (< 50%), consistent with elevated central venous pressure. - Pericardium, extracardiac: Large circumferential pericardial effusion. No tamponade; however effusion larger than study from 12/2014  6/13 Revision left thigh AV graft 6/14 pericardiocentesis; 650 ml serosanguineous fluid drained  Culture   Antibiotics:   DVT prophylaxis: SCD   Devices    LINES / TUBES:      Continuous Infusions:   Objective: VITAL SIGNS: Temp: 97.9 F (36.6 C) (06/16 1600) Temp Source: Oral (06/16 1600) BP: 101/67 mmHg (06/16 1704) Pulse Rate: 0 (06/16 1714) SPO2; FIO2:   Intake/Output Summary (Last 24 hours) at 04/04/15 2002 Last data filed at 04/04/15 1700  Gross  per 24 hour  Intake    170 ml  Output     50 ml  Net    120 ml      Exam: General: A/O 4, slightly drowsy secondary to recent pain medication no acute respiratory distress Eyes: Negative headache, negative retinal hemorrhage ENT: Negative Runny nose, negative ear pain, negative tinnitus, negative gingival bleeding,  Neck:  Negative scars, masses, torticollis, lymphadenopathy, JVD Lungs: Clear to auscultation bilaterally without wheezes or crackles Cardiovascular: Tachycardic, Regular rhythm without murmur gallop or rub normal S1 and S2 (hypotensive) Abdomen: negative abdominal pain, negative dysphagia, Nontender, positive increasing distention (ascites),  soft, bowel sounds positive, no rebound, positive ascites, no appreciable mass Extremities: No significant cyanosis, clubbing, or edema bilateral lower extremities Psychiatric:  Negative depression, negative anxiety, negative fatigue, negative mania, however poor understanding of disease process Neurologic:  Cranial nerves II through XII intact, tongue/uvula midline, all extremities muscle strength 5/5, sensation intact throughout, negative dysarthria, negative expressive aphasia, negative receptive aphasia.      Data Reviewed: Basic Metabolic Panel:  Recent Labs Lab 03/29/15 0541 03/30/15 0932 03/31/15 0303 04/01/15 1731 04/02/15 1118 04/03/15 0915  NA 137 134* 133* 128* 132* 132*  K 5.1 5.1 5.1 6.0* 6.2* 4.9  CL 93* 93* 92*  --  91* 94*  CO2 24 19* 21*  --  22 23  GLUCOSE 99 128* 92 80 84 86  BUN 90* 116* 85*  --  104* 73*  CREATININE 12.55* 14.27* 11.49*  --  13.33* 10.41*  CALCIUM 7.7* 7.6* 7.4*  --  6.8* 7.1*  MG  --   --   --   --  1.8  --   PHOS  --  11.6* 10.4*  --   --  10.1*   Liver Function Tests:  Recent Labs Lab 03/30/15 0932 03/31/15 0303 04/02/15 1118 04/03/15 0915  AST 22  --  34  --   ALT 25  --  28  --   ALKPHOS 112  --  128*  --   BILITOT 0.4  --  0.5  --   PROT 4.8*  --  4.2*  --   ALBUMIN 1.4* 1.5* 1.3* 1.5*   No results for input(s): LIPASE, AMYLASE  in the last 168 hours. No results for input(s): AMMONIA in the last 168 hours. CBC:  Recent Labs Lab 03/29/15 0050 03/29/15 0541 03/30/15 0932 04/01/15 1731 04/02/15 1345 04/03/15 0915  WBC 12.2* 11.9* 12.6*  --  13.9* 14.0*  NEUTROABS  --   --   --   --  11.6*  --   HGB 10.1* 10.6* 9.7* 11.2* 8.5* 8.3*  HCT 30.0* 32.9* 29.9* 33.0* 26.0* 25.3*  MCV 83.3 84.6 83.8  --  85.8 85.5  PLT 258 246 278  --  301 288   Cardiac Enzymes:  Recent Labs Lab 03/29/15 0050 03/29/15 0541 03/29/15 1345  TROPONINI 0.74* 0.73* 0.65*   BNP (last 3 results)  Recent Labs  01/13/15 0151 03/22/15 1210 03/28/15 1450  BNP 2116.2* 1475.4* 831.2*    ProBNP (last 3 results)  Recent Labs  08/31/14 1806  PROBNP 46663.0*    CBG: No results for input(s): GLUCAP in the last 168 hours.  Recent Results (from the past 240 hour(s))  MRSA PCR Screening     Status: None   Collection Time: 03/29/15 12:01 AM  Result Value Ref Range Status   MRSA by PCR NEGATIVE NEGATIVE Final    Comment:  The GeneXpert MRSA Assay (FDA approved for NASAL specimens only), is one component of a comprehensive MRSA colonization surveillance program. It is not intended to diagnose MRSA infection nor to guide or monitor treatment for MRSA infections.   Wound culture     Status: None   Collection Time: 03/29/15 12:50 AM  Result Value Ref Range Status   Specimen Description WOUND LEFT THIGH  Final   Special Requests NONE  Final   Gram Stain   Final    NO WBC SEEN NO SQUAMOUS EPITHELIAL CELLS SEEN NO ORGANISMS SEEN Performed at [facility]    Culture   Final    MULTIPLE ORGANISMS PRESENT, NONE PREDOMINANT Note: NO STAPHYLOCOCCUS AUREUS ISOLATED NO GROUP A STREP (S.PYOGENES) ISOLATED Performed at [facility]    Report Status 03/31/2015 FINAL  Final  Culture, blood (routine x 2)     Status: None   Collection Time: 03/29/15 12:55 AM  Result Value Ref Range Status   Specimen  Description BLOOD HEMODIALYSIS CATHETER  Final   Special Requests BOTTLES DRAWN AEROBIC AND ANAEROBIC 10CC  Final   Culture   Final    NO GROWTH 5 DAYS Performed at [facility]    Report Status 04/04/2015 FINAL  Final  Culture, blood (routine x 2)     Status: None   Collection Time: 03/29/15  1:45 AM  Result Value Ref Range Status   Specimen Description BLOOD HEMODIALYSIS CATHETER  Final   Special Requests BOTTLES DRAWN AEROBIC AND ANAEROBIC 10CC  Final   Culture   Final    NO GROWTH 5 DAYS Performed at [facility]    Report Status 04/04/2015 FINAL  Final  Body fluid culture     Status: None (Preliminary result)   Collection Time: 04/02/15  6:35 PM  Result Value Ref Range Status   Specimen Description FLUID  Final   Special Requests PERICARDIAL  Final   Gram Stain   Final    RARE WBC PRESENT, PREDOMINANTLY PMN NO ORGANISMS SEEN Performed at [facility]    Culture   Final    NO GROWTH 1 DAY Performed at [facility]    Report Status PENDING  Incomplete     Studies:  Recent x-ray studies have been reviewed in detail by the Attending Physician  Scheduled Meds:  Scheduled Meds: . calcium acetate  667 mg Oral TID WC  . darbepoetin (ARANESP) injection - DIALYSIS  60 mcg Intravenous Q Wed-HD  . midodrine  10 mg Oral TID WC  . multivitamin  1 tablet Oral QHS  . senna-docusate  2 tablet Oral BID    Time spent on care of this patient: 40 mins   WOODS, Roselind Messier , MD  [facility] Office  [phone] Pager - [phone]  On-Call/Text Page:      [email or web address]      password [email or web address]  If 7PM-7AM, please contact night-coverage [email or web address] Password [email or web address] 04/04/2015, 8:02 PM   LOS: 7 days   Care during the described time interval was provided by me .  I have reviewed this patient's available data, including medical history, events of note, physical examination, and all test results as part of my evaluation. I have personally  reviewed and interpreted all radiology studies.   Carolyne Littles, MD [phone] Pager

## 2015-04-04 NOTE — Progress Notes (Signed)
Notified by Dr. Arlean Hopping that he has had discussion and recommending no more dialysis and Ms. Everardo All agreed. I went back by to support this decision and help assist with conversation with family. Her Aunt Gwen and chaplain at bedside. Offered assistance and Ms. Portee says she would like to speak with her family alone. Ms. Andary also assures me that she understands her decision and what this means. I told them I will follow up in the morning.   Yong Channel, NP Palliative Medicine Team Pager # [phone] (M-F 8a-5p) Team Phone # [phone] (Nights/Weekends)

## 2015-04-04 NOTE — Progress Notes (Signed)
[facility] Progress Note   Subjective: got 2.5 hours yest, signed off against advice. BP's were low and no fluid was not removed  Filed Vitals:   04/04/15 0700 04/04/15 0800 04/04/15 0900 04/04/15 1000  BP: 77/50 93/43 78/48  82/54  Pulse: 110 110 112 111  Temp:  98.1 F (36.7 C)    TempSrc:  Oral    Resp: 9 16 30 26   Height:      Weight:      SpO2: 100% 97% 99% 100%   Exam: Alert, on HD, [place] O2 No jvd Chest decreased at bases, o/w clear RRR tachy no rub or gallop Abd +ascites moderate, nontender L thigh AVG patent, +bruit, using medial limb x 4 wks 1+ bilat LE edema  CXR 6/9 mild edema and bilat effusions CXR 6/14 R > L asymmetric infiltrates prob edema ECHO 6/10 - EF 60%, RV mild dilated, IVC dilated c/w ^CVP. Large circumferential pericardial effusion  [facility] MWF 4 hr (rarely runs > 1.5 hours) 1 K 2.5 Ca left thigh AVGG NO heparin Refuses Meds most of the time, EDW 72.5       Assessment: 1. Pericardial effusion/ tamponade s/p drainage 6/14 2. Vol excess - no better, ^'d edema cxr 6/14 3. Hypotension no better after procedure, pulm edema worse 4. Chronic pulm HTN/ ascites / recur paracentesis/ chronic vol overload  5. AVG revision 6/13 use medial limb x 4wks 6. ESRD HD MWF w chronic malcompliance 7. Anemia down to 8, start ESA 8. MBD cont phoslo 9. Nutrition w very low alb  Plan - Prognosis poor. We are not going to be able to rectify the situation with dialysis.  Patient tired of suffering and is going to be talking w her family today about comfort/ hospice care. Recommend no further dialysis, I have d/w patient.     Vinson Moselle MD  pager [phone]    cell [phone]  04/04/2015, 11:10 AM     Recent Labs Lab 03/30/15 0932 03/31/15 0303 04/01/15 1731 04/02/15 1118 04/03/15 0915  NA 134* 133* 128* 132* 132*  K 5.1 5.1 6.0* 6.2* 4.9  CL 93* 92*  --  91* 94*  CO2 19* 21*  --  22 23  GLUCOSE 128* 92 80 84 86  BUN 116* 85*  --  104* 73*   CREATININE 14.27* 11.49*  --  13.33* 10.41*  CALCIUM 7.6* 7.4*  --  6.8* 7.1*  PHOS 11.6* 10.4*  --   --  10.1*    Recent Labs Lab 03/30/15 0932 03/31/15 0303 04/02/15 1118 04/03/15 0915  AST 22  --  34  --   ALT 25  --  28  --   ALKPHOS 112  --  128*  --   BILITOT 0.4  --  0.5  --   PROT 4.8*  --  4.2*  --   ALBUMIN 1.4* 1.5* 1.3* 1.5*    Recent Labs Lab 03/30/15 0932 04/01/15 1731 04/02/15 1345 04/03/15 0915  WBC 12.6*  --  13.9* 14.0*  NEUTROABS  --   --  11.6*  --   HGB 9.7* 11.2* 8.5* 8.3*  HCT 29.9* 33.0* 26.0* 25.3*  MCV 83.8  --  85.8 85.5  PLT 278  --  301 288   . calcium acetate  667 mg Oral TID WC  . darbepoetin (ARANESP) injection - DIALYSIS  60 mcg Intravenous Q Wed-HD  . midodrine  10 mg Oral TID WC  . multivitamin  1 tablet Oral QHS  .  senna-docusate  2 tablet Oral BID     acetaminophen, fentaNYL (SUBLIMAZE) injection, ondansetron (ZOFRAN) IV, phenol, zolpidem

## 2015-04-05 ENCOUNTER — Encounter (HOSPITAL_COMMUNITY): Payer: Self-pay | Admitting: Interventional Cardiology

## 2015-04-05 ENCOUNTER — Inpatient Hospital Stay (HOSPITAL_COMMUNITY): Payer: Medicare Other

## 2015-04-05 ENCOUNTER — Other Ambulatory Visit (HOSPITAL_COMMUNITY): Payer: Medicare Other

## 2015-04-05 DIAGNOSIS — I509 Heart failure, unspecified: Secondary | ICD-10-CM

## 2015-04-05 LAB — GLUCOSE, PERITONEAL FLUID: GLUCOSE, PERITONEAL FLUID: 102 mg/dL

## 2015-04-05 LAB — BODY FLUID CELL COUNT WITH DIFFERENTIAL
Lymphs, Fluid: 49 %
Monocyte-Macrophage-Serous Fluid: 48 % — ABNORMAL LOW (ref 50–90)
NEUTROPHIL FLUID: 3 % (ref 0–25)
WBC FLUID: 122 uL (ref 0–1000)

## 2015-04-05 LAB — PROTEIN, BODY FLUID

## 2015-04-05 LAB — LACTATE DEHYDROGENASE, PLEURAL OR PERITONEAL FLUID: LD FL: 51 U/L — AB (ref 3–23)

## 2015-04-05 MED ORDER — LIDOCAINE HCL (PF) 1 % IJ SOLN
INTRAMUSCULAR | Status: AC
  Filled 2015-04-05: qty 10

## 2015-04-05 MED ORDER — OXYCODONE HCL 5 MG PO TABS
10.0000 mg | ORAL_TABLET | Freq: Once | ORAL | Status: AC
  Administered 2015-04-05: 10 mg via ORAL
  Filled 2015-04-05: qty 2

## 2015-04-05 MED ORDER — MIDODRINE HCL 5 MG PO TABS
10.0000 mg | ORAL_TABLET | Freq: Three times a day (TID) | ORAL | Status: DC
  Administered 2015-04-06 – 2015-04-07 (×4): 10 mg via ORAL
  Filled 2015-04-05 (×7): qty 2

## 2015-04-05 MED ORDER — ALBUMIN HUMAN 25 % IV SOLN
25.0000 g | Freq: Once | INTRAVENOUS | Status: DC
  Filled 2015-04-05: qty 100

## 2015-04-05 MED FILL — Heparin Sodium (Porcine) 2 Unit/ML in Sodium Chloride 0.9%: INTRAMUSCULAR | Qty: 500 | Status: AC

## 2015-04-05 NOTE — Progress Notes (Signed)
[facility] Progress Note   Subjective: pericardial drain removed, repeat echo showed no effusion but severe R HF which explains hypotension  Filed Vitals:   04/05/15 0003 04/05/15 0433 04/05/15 0843 04/05/15 1010  BP: 90/72 90/53  105/45  Pulse:  91  114  Temp:  98.4 F (36.9 C)  97.5 F (36.4 C)  TempSrc:  Oral  Oral  Resp:  16    Height:      Weight:  84.1 kg (185 lb 6.5 oz)    SpO2:  95% 98%    Exam: Alert, on HD, [place] O2, not in distress, SOB w small movement No jvd Chest bibasilar crackles RRR tachy no rub or gallop Abd +ascites moderate, nontender L thigh AVG patent, +bruit, using medial limb x 4 wks 1+ bilat LE edema  CXR 6/9 mild edema and bilat effusions CXR 6/14 R > L asymmetric infiltrates prob edema ECHO 6/10 - EF 60%, RV mild dilated, IVC dilated c/w ^CVP. Large circumferential pericardial effusion  [facility] MWF 4 hr (rarely runs > 1.5 hours) 1 K 2.5 Ca left thigh AVGG NO heparin Refuses Meds most of the time, EDW 72.5       Assessment: 1. Pericardial effusion/ tamponade s/p drainage 6/14 2. Vol excess - no better, doesn't tolerate low BP's during HD 3. Hypotension possibly related to severe R HF 4. Chronic pulm HTN/ ascites / recur paracentesis/ chronic vol overload  5. AVG revision 6/13 use medial limb x 4wks 6. ESRD HD MWF w chronic malcompliance 7. Anemia down to 8, start ESA 8. MBD cont phoslo 9. Nutrition w very low alb  Plan - patient wants to continue dialysis for now, plan HD today. UF 1-2 kg as tolerated    Vinson Moselle MD  pager [phone]    cell [phone]  04/05/2015, 12:07 PM     Recent Labs Lab 03/30/15 0932 03/31/15 0303 04/01/15 1731 04/02/15 1118 04/03/15 0915  NA 134* 133* 128* 132* 132*  K 5.1 5.1 6.0* 6.2* 4.9  CL 93* 92*  --  91* 94*  CO2 19* 21*  --  22 23  GLUCOSE 128* 92 80 84 86  BUN 116* 85*  --  104* 73*  CREATININE 14.27* 11.49*  --  13.33* 10.41*  CALCIUM 7.6* 7.4*  --  6.8* 7.1*  PHOS 11.6*  10.4*  --   --  10.1*    Recent Labs Lab 03/30/15 0932 03/31/15 0303 04/02/15 1118 04/03/15 0915  AST 22  --  34  --   ALT 25  --  28  --   ALKPHOS 112  --  128*  --   BILITOT 0.4  --  0.5  --   PROT 4.8*  --  4.2*  --   ALBUMIN 1.4* 1.5* 1.3* 1.5*    Recent Labs Lab 03/30/15 0932 04/01/15 1731 04/02/15 1345 04/03/15 0915  WBC 12.6*  --  13.9* 14.0*  NEUTROABS  --   --  11.6*  --   HGB 9.7* 11.2* 8.5* 8.3*  HCT 29.9* 33.0* 26.0* 25.3*  MCV 83.8  --  85.8 85.5  PLT 278  --  301 288   . calcium acetate  667 mg Oral TID WC  . darbepoetin (ARANESP) injection - DIALYSIS  60 mcg Intravenous Q Wed-HD  . midodrine  10 mg Oral TID WC  . multivitamin  1 tablet Oral QHS  . senna-docusate  2 tablet Oral BID     acetaminophen, fentaNYL (SUBLIMAZE) injection, ondansetron (  ZOFRAN) IV, phenol, zolpidem

## 2015-04-05 NOTE — Progress Notes (Signed)
Nutrition Brief Note  Patient identified on the Malnutrition Screening Tool (MST) Report  Wt Readings from Last 15 Encounters:  04/05/15 185 lb 6.5 oz (84.1 kg)  03/22/15 169 lb 12.1 oz (77 kg)  01/16/15 178 lb 4 oz (80.854 kg)  01/15/15 177 lb 0.5 oz (80.3 kg)  01/09/15 184 lb 11.9 oz (83.8 kg)  12/21/14 160 lb 15 oz (73 kg)  12/11/14 180 lb (81.647 kg)  10/16/14 178 lb (80.74 kg)  09/11/14 174 lb 2.6 oz (79 kg)  09/01/14 173 lb 8 oz (78.7 kg)  07/05/14 178 lb 12.8 oz (81.103 kg)  07/03/14 176 lb 3.2 oz (79.924 kg)  05/23/14 178 lb 9.2 oz (81 kg)  05/19/14 177 lb 0.5 oz (80.3 kg)  05/16/14 177 lb 0.5 oz (80.3 kg)    Body mass index is 27 kg/(m^2). Patient meets criteria for Overweight based on current BMI. Pt states that her usual dry weight is 160 lbs. She reports having a decreased appetite but, she is not interested in any snacks or nutritional supplements; she denies any nutrition questions or concerns at this time.   Current diet order is Renal Diet, patient is consuming approximately 25-100% of meals at this time. Labs and medications reviewed.   No nutrition interventions warranted at this time. If nutrition issues arise, please consult RD.   Ian Malkin RD, LDN Inpatient Clinical Dietitian Pager: [phone] After Hours Pager: [phone]

## 2015-04-05 NOTE — Procedures (Signed)
   US guided RLQ para  5 liter max per MD Straw colored fluid  Sent for labs per MD  Tolerated well

## 2015-04-05 NOTE — Progress Notes (Signed)
[facility]                                                                              Patient Demographics  Holly Mendoza, is a 44 y.o. female, DOB - 09/16/1971, ZOX:[id number]  Admit date - 03/28/2015   Admitting Physician Albertine Grates, MD  Outpatient Primary MD for the patient is Dorrene German, MD  LOS - 8   Chief Complaint  Patient presents with  . Shortness of Breath       Brief HPI  44 y.o. 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 Patient presented to the emergency department c/o cough and shortness of breath. Patient reported progressively worsening shortness of breath over a week. She was given levaquin for possible pna but reported she did not take it. She reported constant chest pain for a week, and worsening of chronic ascites. She reportedly missed her last dialysis. In the ED course she was found to be markedly fluid overloaded, elevation of troponin, mild elevation of potassium, and EKG with ? Mobitz I with QT prolongation of 520. Nephrology was consulted, and patient initially agreed with emergent dialysis. She did not wish to have paracentesis done, She did not wish to have aggressive cardiac intervention however desired to remain full code.     Assessment & Plan    Severe volume overload due to noncompliance w/ HD - Nephrology recommendations reviewed, prognosis poor and recommended no further hemodialysis.  -Continue Midodrin 10 mg TID; patient still borderline hypotensive - Initially patient agreed with the no hemodialysis however after discussion with palliative medicine today, she wants to go to [place] and continue her dialysis. I notified nephrology, Dr. Arlean Hopping about patient's new decision today.   Cirrhosis / chronic ascites  -Increase in ascites, patient requesting for paracentesis today, ordered therapeutic  paracentesis  Large pericardial effusion -S/P pericardiocentesis which obtained 650 ml serosanguineous fluid drained -Pericardial drain removed, cardiology following   HTN/Hyotension -Patient currently hypotensive continue Midodrine 10 mg TID  ESRD w/ noncompliance w/ HD - Psych has seen patient and states she has capacity/is competent to make her own decisions  -Revision left thigh graft 6/14; per nephrology able start using graft right away.   Hyperkalemia due to noncompliance w/ HD -Currently high normal however will trend up without HD   Acute on chronic diastolic congestive heart failure -Secondary to patient's poor BP unable to use CHF medication  Elevated troponin - NSTEMI -Refused planned cardiac cath 6/10 - this is the second time she has been advised to have cath and refused on the day of the procedure (also March 2016)  - Cardiology continues to follow  Code Status:  full code  Family Communication: Discussed in detail with the patient, all imaging results, lab results explained to the patient    Disposition Plan: unclear  Time Spent in minutes  25 minutes  Procedures  6/10 echocardiogram;- LVEF=60%. - Right ventricle: Mild dilitation. No RV free wall diastolic collapse. - Inferior vena cava: The vessel was dilated. The respirophasic diameter changes were blunted (<  50%), consistent with elevated central venous pressure. - Pericardium, extracardiac: Large circumferential pericardial effusion. No tamponade; however effusion larger than study from 12/2014  6/13 Revision left thigh AV graft 6/14 pericardiocentesis; 650 ml serosanguineous fluid drained  Consults   Dr. Delano Metz (nephrology) Dr. Sherren Kerns (vascular surgery) NP Ulice Bold (palliative care)  DVT Prophylaxis SCD's  Medications  Scheduled Meds: . calcium acetate  667 mg Oral TID WC  . darbepoetin (ARANESP) injection - DIALYSIS  60 mcg Intravenous Q Wed-HD  . midodrine   10 mg Oral TID WC  . multivitamin  1 tablet Oral QHS  . senna-docusate  2 tablet Oral BID   Continuous Infusions:  PRN Meds:.acetaminophen, fentaNYL (SUBLIMAZE) injection, ondansetron (ZOFRAN) IV, phenol, zolpidem   Antibiotics   Anti-infectives    None        Subjective:   Holly Mendoza was seen and examined today, earlier in the morning. States abdominal distention is increasing, wants paracentesis today. Patient denies dizziness, N/V/D/C, new weakness, numbess, tingling. No acute events overnight.  States she doesn't know what she wants to do and is waiting for the family for the palliative meeting.  Objective:   Blood pressure 105/45, pulse 114, temperature 97.5 F (36.4 C), temperature source Oral, resp. rate 16, height 5' 9.5" (1.765 m), weight 84.1 kg (185 lb 6.5 oz), SpO2 98 %.  Wt Readings from Last 3 Encounters:  04/05/15 84.1 kg (185 lb 6.5 oz)  03/22/15 77 kg (169 lb 12.1 oz)  01/16/15 80.854 kg (178 lb 4 oz)     Intake/Output Summary (Last 24 hours) at 04/05/15 1203 Last data filed at 04/05/15 0846  Gross per 24 hour  Intake    210 ml  Output      0 ml  Net    210 ml    Exam  General: Alert and oriented x 3, NAD  HEENT:  PERRLA, EOMI, Anicteric Sclera, mucous membranes moist.   Neck: Supple, no JVD, no masses  CVS: S1 S2 auscultated, no rubs, murmurs or gallops. Regular rate and rhythm.  Respiratory: Clear to auscultation bilaterally, no wheezing, rales or rhonchi  Abdomen: Soft, nontender, distended, + bowel sounds  Ext: no cyanosis clubbing or edema  Neuro: AAOx3, Cr N's II- XII. Strength 5/5 upper and lower extremities bilaterally  Skin: No rashes  Psych: Normal affect and demeanor, alert and oriented x3    Data Review   Micro Results Recent Results (from the past 240 hour(s))  MRSA PCR Screening     Status: None   Collection Time: 03/29/15 12:01 AM  Result Value Ref Range Status   MRSA by PCR NEGATIVE NEGATIVE Final    Comment:         The GeneXpert MRSA Assay (FDA approved for NASAL specimens only), is one component of a comprehensive MRSA colonization surveillance program. It is not intended to diagnose MRSA infection nor to guide or monitor treatment for MRSA infections.   Wound culture     Status: None   Collection Time: 03/29/15 12:50 AM  Result Value Ref Range Status   Specimen Description WOUND LEFT THIGH  Final   Special Requests NONE  Final   Gram Stain   Final    NO WBC SEEN NO SQUAMOUS EPITHELIAL CELLS SEEN NO ORGANISMS SEEN Performed at [facility]    Culture   Final    MULTIPLE ORGANISMS PRESENT, NONE PREDOMINANT Note: NO STAPHYLOCOCCUS AUREUS ISOLATED NO GROUP A STREP (S.PYOGENES) ISOLATED Performed at [facility]    Report Status 03/31/2015 FINAL  Final  Culture, blood (routine x 2)     Status: None   Collection Time: 03/29/15 12:55 AM  Result Value Ref Range Status   Specimen Description BLOOD HEMODIALYSIS CATHETER  Final   Special Requests BOTTLES DRAWN AEROBIC AND ANAEROBIC 10CC  Final   Culture   Final    NO GROWTH 5 DAYS Performed at [facility]    Report Status 04/04/2015 FINAL  Final  Culture, blood (routine x 2)     Status: None   Collection Time: 03/29/15  1:45 AM  Result Value Ref Range Status   Specimen Description BLOOD HEMODIALYSIS CATHETER  Final   Special Requests BOTTLES DRAWN AEROBIC AND ANAEROBIC 10CC  Final   Culture   Final    NO GROWTH 5 DAYS Performed at [facility]    Report Status 04/04/2015 FINAL  Final  Body fluid culture     Status: None (Preliminary result)   Collection Time: 04/02/15  6:35 PM  Result Value Ref Range Status   Specimen Description FLUID  Final   Special Requests PERICARDIAL  Final   Gram Stain   Final    RARE WBC PRESENT, PREDOMINANTLY PMN NO ORGANISMS SEEN Performed at [facility]    Culture   Final    NO GROWTH 2 DAYS Performed at [facility]    Report Status PENDING   Incomplete    Radiology Reports Dg Chest 2 View  03/28/2015   CLINICAL DATA:  Increased shortness of breath. End-stage renal disease with dialysis. The patient did not go to dialysis yesterday.  EXAM: CHEST - 2 VIEW  COMPARISON:  Two-view chest 03/22/2015  FINDINGS: The heart is enlarged. Interstitial edema has increased since the prior exam. Small bilateral pleural effusions are noted. Bibasilar airspace disease likely reflects atelectasis.  IMPRESSION: 1. Cardiomegaly and progressive edema and bilateral effusions compatible with congestive heart failure. 2. Bibasilar spur is disease likely reflects atelectasis.   Electronically Signed   By: Marin Roberts M.D.   On: 03/28/2015 15:12   Dg Chest 2 View  03/22/2015   CLINICAL DATA:  Chest pain and shortness of breath for 2 days. Personal history of heart failure.  EXAM: CHEST - 2 VIEW  COMPARISON:  Two-view chest 01/13/2015  FINDINGS: The heart is enlarged. A chronic right pleural effusion is again noted. The a right basilar pleural plaque is not visualized. Mild left basilar airspace opacity is noted. The upper lungs are clear. A hemodialysis shunt is noted in the left upper extremity.  IMPRESSION: 1. Stable cardiomegaly. 2. Chronic right pleural effusion. 3. Minimal left basilar airspace disease. While this likely reflects atelectasis, early infection is also considered.   Electronically Signed   By: Marin Roberts M.D.   On: 03/22/2015 12:32   US Paracentesis  03/13/2015   INDICATION: ascites  EXAM: ULTRASOUND-GUIDED PARACENTESIS  COMPARISON:  Previous paracentesis  MEDICATIONS: 10 cc 1% lidocaine  COMPLICATIONS: None immediate  TECHNIQUE: Informed written consent was obtained from the patient after a discussion of the risks, benefits and alternatives to treatment. A timeout was performed prior to the initiation of the procedure.  Initial ultrasound scanning demonstrates a large amount of ascites within the right lower abdominal quadrant. The  right lower abdomen was prepped and draped in the usual sterile fashion. 1% lidocaine with epinephrine was used for local anesthesia. Under direct ultrasound guidance, a 19 gauge, 7-cm, Yueh catheter was introduced. An ultrasound image  was saved for documentation purposed. The paracentesis was performed. The catheter was removed and a dressing was applied. The patient tolerated the procedure well without immediate post procedural complication.  FINDINGS: A total of approximately 6 liters of yellow fluid was removed.  IMPRESSION: Successful ultrasound-guided paracentesis yielding 6 liters of peritoneal fluid.  Read by:  Robet Leu [facility]   Electronically Signed   By: Irish Lack M.D.   On: 03/13/2015 12:34   Dg Chest Port 1 View  04/02/2015   CLINICAL DATA:  Shortness of breath, end-stage renal disease, hypertension, pulmonary hypertension, cirrhosis  EXAM: PORTABLE CHEST - 1 VIEW  COMPARISON:  Portable exam 2149 hours compared to 03/28/2015  FINDINGS: Enlargement of cardiac silhouette with pulmonary vascular congestion.  Asymmetric infiltrates RIGHT greater than LEFT question asymmetric edema versus infection.  Small RIGHT pleural effusion.  No pneumothorax.  IMPRESSION: Enlargement of cardiac silhouette with pulmonary vascular congestion.  Asymmetric pulmonary infiltrates RIGHT greater than LEFT question pulmonary edema versus infection.   Electronically Signed   By: Ulyses Southward M.D.   On: 04/02/2015 22:12    CBC  Recent Labs Lab 03/30/15 0932 04/01/15 1731 04/02/15 1345 04/03/15 0915  WBC 12.6*  --  13.9* 14.0*  HGB 9.7* 11.2* 8.5* 8.3*  HCT 29.9* 33.0* 26.0* 25.3*  PLT 278  --  301 288  MCV 83.8  --  85.8 85.5  MCH 27.2  --  28.1 28.0  MCHC 32.4  --  32.7 32.8  RDW 14.7  --  15.0 14.9  LYMPHSABS  --   --  0.9  --   MONOABS  --   --  1.4*  --   EOSABS  --   --  0.1  --   BASOSABS  --   --  0.0  --     Chemistries   Recent Labs Lab 03/30/15 0932 03/31/15 0303 04/01/15 1731  04/02/15 1118 04/03/15 0915  NA 134* 133* 128* 132* 132*  K 5.1 5.1 6.0* 6.2* 4.9  CL 93* 92*  --  91* 94*  CO2 19* 21*  --  22 23  GLUCOSE 128* 92 80 84 86  BUN 116* 85*  --  104* 73*  CREATININE 14.27* 11.49*  --  13.33* 10.41*  CALCIUM 7.6* 7.4*  --  6.8* 7.1*  MG  --   --   --  1.8  --   AST 22  --   --  34  --   ALT 25  --   --  28  --   ALKPHOS 112  --   --  128*  --   BILITOT 0.4  --   --  0.5  --    ------------------------------------------------------------------------------------------------------------------ estimated creatinine clearance is 8.1 mL/min (by C-G formula based on Cr of 10.41). ------------------------------------------------------------------------------------------------------------------ No results for input(s): HGBA1C in the last 72 hours. ------------------------------------------------------------------------------------------------------------------ No results for input(s): CHOL, HDL, LDLCALC, TRIG, CHOLHDL, LDLDIRECT in the last 72 hours. ------------------------------------------------------------------------------------------------------------------ No results for input(s): TSH, T4TOTAL, T3FREE, THYROIDAB in the last 72 hours.  Invalid input(s): FREET3 ------------------------------------------------------------------------------------------------------------------ No results for input(s): VITAMINB12, FOLATE, FERRITIN, TIBC, IRON, RETICCTPCT in the last 72 hours.  Coagulation profile No results for input(s): INR, PROTIME in the last 168 hours.  No results for input(s): DDIMER in the last 72 hours.  Cardiac Enzymes  Recent Labs Lab 03/29/15 1345  TROPONINI 0.65*   ------------------------------------------------------------------------------------------------------------------ Invalid input(s): POCBNP  No results for input(s): GLUCAP in the last 72 hours.   RAI,RIPUDEEP M.D.  [facility] 04/05/2015, 12:03 PM  Pager: [phone]    Between 7am to 7pm - call Pager - [phone]  After 7pm go to [email or web address] - password [email or web address]  Call night coverage person covering after 7pm

## 2015-04-05 NOTE — Progress Notes (Addendum)
Daily Progress Note   Patient Name: Holly Mendoza       Date: 04/05/2015 DOB: July 10, 1971  Age: 44 y.o. MRN#: [id number] Attending Physician: Cathren Harsh, MD Primary Care Physician: Dorrene German, MD Admit Date: 03/28/2015  Reason for Consultation/Follow-up: Establishing goals of care  Subjective:     Holly Mendoza is in bed and aunt Dedra Skeens and a gentleman are at bedside. They are now telling me that they want to stabilize her the best we can and transfer her to [place] to continue dialysis therapy there. They have contacted [facility] and spoken with a Dr. Noel Gerold there. I educated them that in my experience insurance will not pay for a transfer to another facility when dialysis is available here. Best option would be optimize, family to transport, and check in through ED in [place] (with her medical records) for continued care. I have discussed there wishes with Dr. Isidoro Donning and Dr. Arlean Hopping is being notified by Dr. Isidoro Donning.   I reinforced to them the option is always available to stop dialysis and proceed with hospice care - especially when dialysis is not improving QOL and causing more harm than benefit. They verbalize understanding but are not open to these options at this time.     Length of Stay: 8 days  Current Medications: Scheduled Meds:  . calcium acetate  667 mg Oral TID WC  . darbepoetin (ARANESP) injection - DIALYSIS  60 mcg Intravenous Q Wed-HD  . midodrine  10 mg Oral TID WC  . multivitamin  1 tablet Oral QHS  . senna-docusate  2 tablet Oral BID    Continuous Infusions:    PRN Meds: acetaminophen, fentaNYL (SUBLIMAZE) injection, ondansetron (ZOFRAN) IV, phenol, zolpidem  Palliative Performance Scale: 50%     Vital Signs: BP 90/53 mmHg  Pulse 91  Temp(Src) 98.4 F (36.9 C) (Oral)  Resp 16  Ht 5' 9.5" (1.765 m)  Wt 84.1 kg (185 lb 6.5 oz)  BMI 27.00 kg/m2  SpO2 98% SpO2: SpO2: 98 % O2 Device: O2 Device: Nasal Cannula O2 Flow Rate: O2 Flow  Rate (L/min): 4 L/min  Intake/output summary:  Intake/Output Summary (Last 24 hours) at 04/05/15 0954 Last data filed at 04/05/15 0800  Gross per 24 hour  Intake    150 ml  Output      0 ml  Net    150 ml   LBM: Last BM Date: 04/04/15 Baseline Weight: Weight: 76.658 kg (169 lb) Most recent weight: Weight: 84.1 kg (185 lb 6.5 oz)  Physical Exam: General: NAD, lying in bed HEENT: [place]/AT, moist mucous membranes CVS: RRR Resp: No labored breathing Abd: Distended Neuro: Awake, alert, oriented x 3    Additional Data Reviewed: Recent Labs     04/02/15  1118  04/02/15  1345  04/03/15  0915  WBC   --   13.9*  14.0*  HGB   --   8.5*  8.3*  PLT   --   301  288  NA  132*   --   132*  BUN  104*   --   73*  CREATININE  13.33*   --   10.41*     Problem List:  Patient Active Problem List   Diagnosis Date Noted  . Congestive heart disease   . NSTEMI (non-ST elevated myocardial infarction)   . Acute on chronic diastolic CHF (congestive heart failure)   . Elevated troponin   . Cirrhosis   . End stage renal disease on  dialysis   . Other specified hypotension   . Pericardial tamponade   . Adjustment disorder with mixed anxiety and depressed mood 04/01/2015  . Noncompliance   . Pericardial effusion   . Palliative care encounter   . Chest pain with high risk for cardiac etiology 03/28/2015  . Hyperkalemia 12/19/2014  . Abdominal pain 12/19/2014  . Anemia of renal disease 08/31/2014  . Ascites 07/11/2014  . 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 Volume overload 05/11/2014  . Pulmonary edema 05/07/2014  . HTN (hypertension) 04/24/2014  . Prolonged Q-T interval on ECG 04/24/2014  . Hx of ascites 04/20/2014     Palliative Care Assessment & Plan    Code Status:  FULL   Goals of Care:  Considering stopping dialysis - we discussed how this is not working and not likely to get better for her.   Encouraged to complete HCPOA and talk  with family about her goals.  3. Symptom Management:  Chest pain around pericardial drain: Removed yesterday.    4. Prognosis: Unable to determine - poor and very fragile - days to weeks with stopping dialysis.   5. Discharge Planning: She tells me she does not want to do hospice yet. She says she has recently began to have a good relationship with her mother. Asking for discharge soon so she can go to [place] for continued care and dialysis.     Thank you for allowing the Palliative Medicine Team to assist in the care of this patient.   Time In: 0930 Time Out: 1000 Total Time Prolonged Time Billed  no     Greater than 50%  of this time was spent counseling and coordinating care related to the above assessment and plan.   Yong Channel, NP Palliative Medicine Team Pager # [phone] (M-F 8a-5p) Team Phone # [phone] (Nights/Weekends)  04/05/2015, 9:54 AM

## 2015-04-06 ENCOUNTER — Inpatient Hospital Stay (HOSPITAL_COMMUNITY): Payer: Medicare Other

## 2015-04-06 LAB — CBC
HCT: 26.2 % — ABNORMAL LOW (ref 36.0–46.0)
Hemoglobin: 8.4 g/dL — ABNORMAL LOW (ref 12.0–15.0)
MCH: 28 pg (ref 26.0–34.0)
MCHC: 32.1 g/dL (ref 30.0–36.0)
MCV: 87.3 fL (ref 78.0–100.0)
PLATELETS: 356 10*3/uL (ref 150–400)
RBC: 3 MIL/uL — ABNORMAL LOW (ref 3.87–5.11)
RDW: 16 % — AB (ref 11.5–15.5)
WBC: 12.4 10*3/uL — ABNORMAL HIGH (ref 4.0–10.5)

## 2015-04-06 LAB — BASIC METABOLIC PANEL
ANION GAP: 14 (ref 5–15)
BUN: 65 mg/dL — ABNORMAL HIGH (ref 6–20)
CALCIUM: 6.6 mg/dL — AB (ref 8.9–10.3)
CO2: 22 mmol/L (ref 22–32)
Chloride: 101 mmol/L (ref 101–111)
Creatinine, Ser: 10.03 mg/dL — ABNORMAL HIGH (ref 0.44–1.00)
GFR calc Af Amer: 5 mL/min — ABNORMAL LOW (ref >60)
GFR calc non Af Amer: 4 mL/min — ABNORMAL LOW (ref >60)
GLUCOSE: 92 mg/dL (ref 65–99)
Potassium: 5.1 mmol/L (ref 3.5–5.1)
SODIUM: 137 mmol/L (ref 135–145)

## 2015-04-06 LAB — BODY FLUID CULTURE: Culture: NO GROWTH

## 2015-04-06 LAB — GRAM STAIN

## 2015-04-06 MED ORDER — GI COCKTAIL ~~LOC~~
30.0000 mL | Freq: Three times a day (TID) | ORAL | Status: DC | PRN
  Administered 2015-04-06 (×2): 30 mL via ORAL
  Filled 2015-04-06 (×5): qty 30

## 2015-04-06 MED ORDER — PENTAFLUOROPROP-TETRAFLUOROETH EX AERO: 1.0000 "application " | INHALATION_SPRAY | CUTANEOUS | Status: DC | PRN

## 2015-04-06 MED ORDER — ALTEPLASE 2 MG IJ SOLR
2.0000 mg | Freq: Once | INTRAMUSCULAR | Status: AC | PRN
Start: 2015-04-06 — End: 2015-04-06
  Filled 2015-04-06: qty 2

## 2015-04-06 MED ORDER — SODIUM CHLORIDE 0.9 % IV SOLN: 100.0000 mL | INTRAVENOUS | Status: DC | PRN

## 2015-04-06 MED ORDER — LIDOCAINE-PRILOCAINE 2.5-2.5 % EX CREA
1.0000 "application " | TOPICAL_CREAM | CUTANEOUS | Status: DC | PRN
  Filled 2015-04-06: qty 5

## 2015-04-06 MED ORDER — NEPRO/CARBSTEADY PO LIQD
237.0000 mL | ORAL | Status: DC | PRN
  Filled 2015-04-06: qty 237

## 2015-04-06 MED ORDER — PANTOPRAZOLE SODIUM 40 MG PO TBEC
40.0000 mg | DELAYED_RELEASE_TABLET | Freq: Two times a day (BID) | ORAL | Status: DC
  Administered 2015-04-06 (×2): 40 mg via ORAL
  Filled 2015-04-06: qty 1

## 2015-04-06 MED ORDER — LIDOCAINE HCL (PF) 1 % IJ SOLN: 5.0000 mL | INTRAMUSCULAR | Status: DC | PRN

## 2015-04-06 MED ORDER — MIDODRINE HCL 5 MG PO TABS
ORAL_TABLET | ORAL | Status: AC
  Filled 2015-04-06: qty 2

## 2015-04-06 MED ORDER — HEPARIN SODIUM (PORCINE) 1000 UNIT/ML DIALYSIS: 1000.0000 [IU] | INTRAMUSCULAR | Status: DC | PRN

## 2015-04-06 MED ORDER — CALCIUM ACETATE (PHOS BINDER) 667 MG PO CAPS
667.0000 mg | ORAL_CAPSULE | Freq: Three times a day (TID) | ORAL | Status: DC
  Administered 2015-04-06 – 2015-04-07 (×4): 667 mg via ORAL
  Filled 2015-04-06 (×7): qty 1

## 2015-04-06 MED ORDER — PANTOPRAZOLE SODIUM 40 MG IV SOLR
40.0000 mg | Freq: Two times a day (BID) | INTRAVENOUS | Status: DC
  Filled 2015-04-06 (×2): qty 40

## 2015-04-06 NOTE — Procedures (Signed)
Patient stable on HD. BP's low in 80-90 range. Had paracentesis yesterday with good result. Mild diffuse edema. For HD today, UF 1-2kg if BP will tolerate. Possible dc in am.    I was present at this dialysis session, have reviewed the session itself and made  appropriate changes Vinson Moselle MD (pgr) [phone]    ([id number] 04/06/2015, 10:31 AM

## 2015-04-06 NOTE — Progress Notes (Signed)
[facility]                                                                              Patient Demographics  Holly Mendoza, is a 44 y.o. female, DOB - 03-25-1971, MBW:[id number]  Admit date - 03/28/2015   Admitting Physician Albertine Grates, MD  Outpatient Primary MD for the patient is Dorrene German, MD  LOS - 9   Chief Complaint  Patient presents with  . Shortness of Breath       Brief HPI  44 y.o. 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 Patient presented to the emergency department c/o cough and shortness of breath. Patient reported progressively worsening shortness of breath over a week. She was given levaquin for possible pna but reported she did not take it. She reported constant chest pain for a week, and worsening of chronic ascites. She reportedly missed her last dialysis. In the ED course she was found to be markedly fluid overloaded, elevation of troponin, mild elevation of potassium, and EKG with ? Mobitz I with QT prolongation of 520. Nephrology was consulted, and patient initially agreed with emergent dialysis. She did not wish to have paracentesis done, She did not wish to have aggressive cardiac intervention however desired to remain full code.     Assessment & Plan    Severe volume overload due to noncompliance w/ HD - Nephrology recommendations reviewed, prognosis poor and recommended no further hemodialysis.  -Continue Midodrin 10 mg TID; patient still borderline hypotensive -Continue hemodialysis per patient's wishes, eventually wants to go to [place]  GERD: Placed on PPI  Cirrhosis / chronic ascites  -Status post paracentesis, 5 L removed, no SBP per studies  Large pericardial effusion -S/P pericardiocentesis which obtained 650 ml serosanguineous fluid drained -Pericardial drain removed, cardiology following    HTN/Hyotension -Patient currently hypotensive continue Midodrine 10 mg TID  ESRD w/ noncompliance w/ HD - Psych has seen patient and states she has capacity/is competent to make her own decisions  -Revision left thigh graft 6/14; per nephrology able start using graft right away.   Hyperkalemia due to noncompliance w/ HD -Currently stable, recheck BMET  Acute on chronic diastolic congestive heart failure -Secondary to patient's poor BP unable to use CHF medication  Elevated troponin - NSTEMI -Refused planned cardiac cath 6/10 - this is the second time she has been advised to have cath and refused on the day of the procedure (also March 2016)  - Cardiology continues to follow  Code Status:  full code  Family Communication: Discussed in detail with the patient, all imaging results, lab results explained to the patient    Disposition Plan: Hopefully DC home in a.m after HD., will continue to hemodialysis in [place] until plans in [place].  Time Spent in minutes  25 minutes  Procedures  6/10 echocardiogram;- LVEF=60%. - Right ventricle: Mild dilitation. No RV free wall diastolic collapse. - Inferior vena cava: The vessel was dilated. The respirophasic diameter changes were blunted (< 50%), consistent with elevated central venous pressure. - Pericardium, extracardiac:  Large circumferential pericardial effusion. No tamponade; however effusion larger than study from 12/2014  6/13 Revision left thigh AV graft 6/14 pericardiocentesis; 650 ml serosanguineous fluid drained  Consults   Dr. Delano Metz (nephrology) Dr. Sherren Kerns (vascular surgery) NP Ulice Bold (palliative care)  DVT Prophylaxis SCD's  Medications  Scheduled Meds: . albumin human  25 g Intravenous Once  . calcium acetate  667 mg Oral TID WC  . darbepoetin (ARANESP) injection - DIALYSIS  60 mcg Intravenous Q Wed-HD  . midodrine  10 mg Oral TID WC  . multivitamin  1 tablet Oral QHS   . pantoprazole (PROTONIX) IV  40 mg Intravenous Q12H  . senna-docusate  2 tablet Oral BID   Continuous Infusions:  PRN Meds:.acetaminophen, fentaNYL (SUBLIMAZE) injection, gi cocktail, ondansetron (ZOFRAN) IV, phenol, zolpidem   Antibiotics   Anti-infectives    None        Subjective:   Holly Mendoza was seen and examined today. Feeling better, status post paracentesis. No chest pain or shortness of breath at this time. Wants midodrine to get "pressure" up. Patient denies dizziness, N/V/D/C, new weakness, numbess, tingling. No acute events overnight.    Objective:   Blood pressure 90/56, pulse 102, temperature 97.5 F (36.4 C), temperature source Oral, resp. rate 18, height 5' 9.5" (1.765 m), weight 76.8 kg (169 lb 5 oz), SpO2 94 %.  Wt Readings from Last 3 Encounters:  04/06/15 76.8 kg (169 lb 5 oz)  03/22/15 77 kg (169 lb 12.1 oz)  01/16/15 80.854 kg (178 lb 4 oz)     Intake/Output Summary (Last 24 hours) at 04/06/15 1302 Last data filed at 04/06/15 0841  Gross per 24 hour  Intake    720 ml  Output      0 ml  Net    720 ml    Exam  General: Alert and oriented x 3, NAD  HEENT:  PERRLA, EOMI,    Neck: Supple,  CVS: S1 S2 auscultated, no rubs, murmurs or gallops. Regular rate and rhythm.  Respiratory: dec BS at bases  Abdomen: Soft, nontender, distended, + bowel sounds  Ext: no cyanosis clubbing or edema  Neuro: no new deficits  Skin: No rashes  Psych: Normal affect and demeanor, alert and oriented x3    Data Review   Micro Results Recent Results (from the past 240 hour(s))  MRSA PCR Screening     Status: None   Collection Time: 03/29/15 12:01 AM  Result Value Ref Range Status   MRSA by PCR NEGATIVE NEGATIVE Final    Comment:        The GeneXpert MRSA Assay (FDA approved for NASAL specimens only), is one component of a comprehensive MRSA colonization surveillance program. It is not intended to diagnose MRSA infection nor to guide  or monitor treatment for MRSA infections.   Wound culture     Status: None   Collection Time: 03/29/15 12:50 AM  Result Value Ref Range Status   Specimen Description WOUND LEFT THIGH  Final   Special Requests NONE  Final   Gram Stain   Final    NO WBC SEEN NO SQUAMOUS EPITHELIAL CELLS SEEN NO ORGANISMS SEEN Performed at [facility]    Culture   Final    MULTIPLE ORGANISMS PRESENT, NONE PREDOMINANT Note: NO STAPHYLOCOCCUS AUREUS ISOLATED NO GROUP A STREP (S.PYOGENES) ISOLATED Performed at [facility]    Report Status 03/31/2015 FINAL  Final  Culture, blood (routine x 2)  Status: None   Collection Time: 03/29/15 12:55 AM  Result Value Ref Range Status   Specimen Description BLOOD HEMODIALYSIS CATHETER  Final   Special Requests BOTTLES DRAWN AEROBIC AND ANAEROBIC 10CC  Final   Culture   Final    NO GROWTH 5 DAYS Performed at [facility]    Report Status 04/04/2015 FINAL  Final  Culture, blood (routine x 2)     Status: None   Collection Time: 03/29/15  1:45 AM  Result Value Ref Range Status   Specimen Description BLOOD HEMODIALYSIS CATHETER  Final   Special Requests BOTTLES DRAWN AEROBIC AND ANAEROBIC 10CC  Final   Culture   Final    NO GROWTH 5 DAYS Performed at [facility]    Report Status 04/04/2015 FINAL  Final  Body fluid culture     Status: None   Collection Time: 04/02/15  6:35 PM  Result Value Ref Range Status   Specimen Description FLUID  Final   Special Requests PERICARDIAL  Final   Gram Stain   Final    RARE WBC PRESENT, PREDOMINANTLY PMN NO ORGANISMS SEEN Performed at [facility]    Culture   Final    NO GROWTH 3 DAYS Performed at [facility]    Report Status 04/06/2015 FINAL  Final    Radiology Reports Dg Chest 2 View  03/28/2015   CLINICAL DATA:  Increased shortness of breath. End-stage renal disease with dialysis. The patient did not go to dialysis yesterday.  EXAM: CHEST - 2 VIEW   COMPARISON:  Two-view chest 03/22/2015  FINDINGS: The heart is enlarged. Interstitial edema has increased since the prior exam. Small bilateral pleural effusions are noted. Bibasilar airspace disease likely reflects atelectasis.  IMPRESSION: 1. Cardiomegaly and progressive edema and bilateral effusions compatible with congestive heart failure. 2. Bibasilar spur is disease likely reflects atelectasis.   Electronically Signed   By: Marin Roberts M.D.   On: 03/28/2015 15:12   Dg Chest 2 View  03/22/2015   CLINICAL DATA:  Chest pain and shortness of breath for 2 days. Personal history of heart failure.  EXAM: CHEST - 2 VIEW  COMPARISON:  Two-view chest 01/13/2015  FINDINGS: The heart is enlarged. A chronic right pleural effusion is again noted. The a right basilar pleural plaque is not visualized. Mild left basilar airspace opacity is noted. The upper lungs are clear. A hemodialysis shunt is noted in the left upper extremity.  IMPRESSION: 1. Stable cardiomegaly. 2. Chronic right pleural effusion. 3. Minimal left basilar airspace disease. While this likely reflects atelectasis, early infection is also considered.   Electronically Signed   By: Marin Roberts M.D.   On: 03/22/2015 12:32   US Paracentesis  04/05/2015   INDICATION: ascites  EXAM: ULTRASOUND-GUIDED PARACENTESIS  COMPARISON:  Previous para  MEDICATIONS: 10 cc 1% lidocaine  COMPLICATIONS: None immediate  TECHNIQUE: Informed written consent was obtained from the patient after a discussion of the risks, benefits and alternatives to treatment. A timeout was performed prior to the initiation of the procedure.  Initial ultrasound scanning demonstrates a large amount of ascites within the right lower abdominal quadrant. The right lower abdomen was prepped and draped in the usual sterile fashion. 1% lidocaine with epinephrine was used for local anesthesia. Under direct ultrasound guidance, a 19 gauge, 7-cm, Yueh catheter was introduced. An ultrasound  image was saved for documentation purposed. the paracentesis was performed. The catheter was removed and a dressing was applied. The patient tolerated the  procedure well without immediate post procedural complication.  FINDINGS: A total of approximately 5 liters of straw colored fluid was removed. Samples were sent to the laboratory as requested by the clinical team.  IMPRESSION: Successful ultrasound-guided paracentesis yielding 5 liters of peritoneal fluid.  5 liter max per MD.  Read by:  Robet Leu [facility]   Electronically Signed   By: Jolaine Click M.D.   On: 04/05/2015 15:05   US Paracentesis  03/13/2015   INDICATION: ascites  EXAM: ULTRASOUND-GUIDED PARACENTESIS  COMPARISON:  Previous paracentesis  MEDICATIONS: 10 cc 1% lidocaine  COMPLICATIONS: None immediate  TECHNIQUE: Informed written consent was obtained from the patient after a discussion of the risks, benefits and alternatives to treatment. A timeout was performed prior to the initiation of the procedure.  Initial ultrasound scanning demonstrates a large amount of ascites within the right lower abdominal quadrant. The right lower abdomen was prepped and draped in the usual sterile fashion. 1% lidocaine with epinephrine was used for local anesthesia. Under direct ultrasound guidance, a 19 gauge, 7-cm, Yueh catheter was introduced. An ultrasound image was saved for documentation purposed. The paracentesis was performed. The catheter was removed and a dressing was applied. The patient tolerated the procedure well without immediate post procedural complication.  FINDINGS: A total of approximately 6 liters of yellow fluid was removed.  IMPRESSION: Successful ultrasound-guided paracentesis yielding 6 liters of peritoneal fluid.  Read by:  Robet Leu [facility]   Electronically Signed   By: Irish Lack M.D.   On: 03/13/2015 12:34   Dg Chest Port 1 View  04/02/2015   CLINICAL DATA:  Shortness of breath, end-stage renal disease, hypertension, pulmonary  hypertension, cirrhosis  EXAM: PORTABLE CHEST - 1 VIEW  COMPARISON:  Portable exam 2149 hours compared to 03/28/2015  FINDINGS: Enlargement of cardiac silhouette with pulmonary vascular congestion.  Asymmetric infiltrates RIGHT greater than LEFT question asymmetric edema versus infection.  Small RIGHT pleural effusion.  No pneumothorax.  IMPRESSION: Enlargement of cardiac silhouette with pulmonary vascular congestion.  Asymmetric pulmonary infiltrates RIGHT greater than LEFT question pulmonary edema versus infection.   Electronically Signed   By: Ulyses Southward M.D.   On: 04/02/2015 22:12    CBC  Recent Labs Lab 04/01/15 1731 04/02/15 1345 04/03/15 0915  WBC  --  13.9* 14.0*  HGB 11.2* 8.5* 8.3*  HCT 33.0* 26.0* 25.3*  PLT  --  301 288  MCV  --  85.8 85.5  MCH  --  28.1 28.0  MCHC  --  32.7 32.8  RDW  --  15.0 14.9  LYMPHSABS  --  0.9  --   MONOABS  --  1.4*  --   EOSABS  --  0.1  --   BASOSABS  --  0.0  --     Chemistries   Recent Labs Lab 03/31/15 0303 04/01/15 1731 04/02/15 1118 04/03/15 0915  NA 133* 128* 132* 132*  K 5.1 6.0* 6.2* 4.9  CL 92*  --  91* 94*  CO2 21*  --  22 23  GLUCOSE 92 80 84 86  BUN 85*  --  104* 73*  CREATININE 11.49*  --  13.33* 10.41*  CALCIUM 7.4*  --  6.8* 7.1*  MG  --   --  1.8  --   AST  --   --  34  --   ALT  --   --  28  --   ALKPHOS  --   --  128*  --  BILITOT  --   --  0.5  --    ------------------------------------------------------------------------------------------------------------------ estimated creatinine clearance is 7.3 mL/min (by C-G formula based on Cr of 10.41). ------------------------------------------------------------------------------------------------------------------ No results for input(s): HGBA1C in the last 72 hours. ------------------------------------------------------------------------------------------------------------------ No results for input(s): CHOL, HDL, LDLCALC, TRIG, CHOLHDL, LDLDIRECT in the last  72 hours. ------------------------------------------------------------------------------------------------------------------ No results for input(s): TSH, T4TOTAL, T3FREE, THYROIDAB in the last 72 hours.  Invalid input(s): FREET3 ------------------------------------------------------------------------------------------------------------------ No results for input(s): VITAMINB12, FOLATE, FERRITIN, TIBC, IRON, RETICCTPCT in the last 72 hours.  Coagulation profile No results for input(s): INR, PROTIME in the last 168 hours.  No results for input(s): DDIMER in the last 72 hours.  Cardiac Enzymes No results for input(s): CKMB, TROPONINI, MYOGLOBIN in the last 168 hours.  Invalid input(s): CK ------------------------------------------------------------------------------------------------------------------ Invalid input(s): POCBNP  No results for input(s): GLUCAP in the last 72 hours.   Lilymae Swiech M.D. [facility] 04/06/2015, 1:02 PM  Pager: [phone]   Between 7am to 7pm - call Pager - [phone]  After 7pm go to [email or web address] - password [email or web address]  Call night coverage person covering after 7pm

## 2015-04-06 NOTE — Progress Notes (Signed)
Pt dialyzer clotted 1.36 hrs into tx.  Pt refused to have new system placed to complete dialysis tx.  Pt had 1.24 hrs remaining.  Dr. Arlean Hopping notified.  HD tx discontinued.  VSS and pt returned to her room.

## 2015-04-06 NOTE — Progress Notes (Signed)
Patient ID: Severa Jeremiah, female   DOB: Oct 01, 1971, 44 y.o.   MRN: [id number]    Patient Name: Holly Mendoza Date of Encounter: 04/06/2015     Active Problems:   HTN (hypertension)   Ascites   Hyperkalemia   Chest pain with high risk for cardiac etiology   Palliative care encounter   Noncompliance   Adjustment disorder with mixed anxiety and depressed mood   Acute on chronic diastolic CHF (congestive heart failure)   Elevated troponin   Cirrhosis   End stage renal disease on dialysis   Other specified hypotension   Pericardial tamponade   Congestive heart disease   NSTEMI (non-ST elevated myocardial infarction)    SUBJECTIVE  Chest pain and sob is improved.   CURRENT MEDS . albumin human  25 g Intravenous Once  . calcium acetate  667 mg Oral TID WC  . darbepoetin (ARANESP) injection - DIALYSIS  60 mcg Intravenous Q Wed-HD  . midodrine  10 mg Oral TID WC  . multivitamin  1 tablet Oral QHS  . senna-docusate  2 tablet Oral BID    OBJECTIVE  Filed Vitals:   04/05/15 1845 04/05/15 1847 04/05/15 2040 04/06/15 0406  BP: 90/56  Pulse:  102 82 102  Temp:   97.9 F (36.6 C) 97.5 F (36.4 C)  TempSrc:   Oral Oral  Resp:   18 18  Height:      Weight:    169 lb 5 oz (76.8 kg)  SpO2:   94% 94%    Intake/Output Summary (Last 24 hours) at 04/06/15 0907 Last data filed at 04/06/15 0841  Gross per 24 hour  Intake    720 ml  Output      0 ml  Net    720 ml   Filed Weights   04/03/15 1128 04/05/15 0433 04/06/15 0406  Weight: 186 lb 15.2 oz (84.8 kg) 185 lb 6.5 oz (84.1 kg) 169 lb 5 oz (76.8 kg)    PHYSICAL EXAM  General: Pleasant, NAD. Neuro: Alert and oriented X 3. Moves all extremities spontaneously. Psych: Normal affect. HEENT:  Normal  Neck: Supple without bruits, JVD 8 cm. Lungs:  Resp regular and unlabored, CTA. Heart: RRR with RV heave and TR murmur. Abdomen: Soft, non-tender, non-distended, BS + x 4. Minimal ascities Extremities: No  clubbing, trace peripheral edema. DP/PT/Radials 2+ and equal bilaterally.  Accessory Clinical Findings  CBC  Recent Labs  04/03/15 0915  WBC 14.0*  HGB 8.3*  HCT 25.3*  MCV 85.5  PLT 288   Basic Metabolic Panel  Recent Labs  04/03/15 0915  NA 132*  K 4.9  CL 94*  CO2 23  GLUCOSE 86  BUN 73*  CREATININE 10.41*  CALCIUM 7.1*  PHOS 10.1*   Liver Function Tests  Recent Labs  04/03/15 0915  ALBUMIN 1.5*   No results for input(s): LIPASE, AMYLASE in the last 72 hours. Cardiac Enzymes No results for input(s): CKTOTAL, CKMB, CKMBINDEX, TROPONINI in the last 72 hours. BNP Invalid input(s): POCBNP D-Dimer No results for input(s): DDIMER in the last 72 hours. Hemoglobin A1C No results for input(s): HGBA1C in the last 72 hours. Fasting Lipid Panel No results for input(s): CHOL, HDL, LDLCALC, TRIG, CHOLHDL, LDLDIRECT in the last 72 hours. Thyroid Function Tests No results for input(s): TSH, T4TOTAL, T3FREE, THYROIDAB in the last 72 hours.  Invalid input(s): FREET3  TELE  Probable sinus tachy  Radiology/Studies  Dg Chest 2 View  03/28/2015   CLINICAL  DATA:  Increased shortness of breath. End-stage renal disease with dialysis. The patient did not go to dialysis yesterday.  EXAM: CHEST - 2 VIEW  COMPARISON:  Two-view chest 03/22/2015  FINDINGS: The heart is enlarged. Interstitial edema has increased since the prior exam. Small bilateral pleural effusions are noted. Bibasilar airspace disease likely reflects atelectasis.  IMPRESSION: 1. Cardiomegaly and progressive edema and bilateral effusions compatible with congestive heart failure. 2. Bibasilar spur is disease likely reflects atelectasis.   Electronically Signed   By: Marin Roberts M.D.   On: 03/28/2015 15:12   Dg Chest 2 View  03/22/2015   CLINICAL DATA:  Chest pain and shortness of breath for 2 days. Personal history of heart failure.  EXAM: CHEST - 2 VIEW  COMPARISON:  Two-view chest 01/13/2015  FINDINGS: The  heart is enlarged. A chronic right pleural effusion is again noted. The a right basilar pleural plaque is not visualized. Mild left basilar airspace opacity is noted. The upper lungs are clear. A hemodialysis shunt is noted in the left upper extremity.  IMPRESSION: 1. Stable cardiomegaly. 2. Chronic right pleural effusion. 3. Minimal left basilar airspace disease. While this likely reflects atelectasis, early infection is also considered.   Electronically Signed   By: Marin Roberts M.D.   On: 03/22/2015 12:32   US Paracentesis  04/05/2015   INDICATION: ascites  EXAM: ULTRASOUND-GUIDED PARACENTESIS  COMPARISON:  Previous para  MEDICATIONS: 10 cc 1% lidocaine  COMPLICATIONS: None immediate  TECHNIQUE: Informed written consent was obtained from the patient after a discussion of the risks, benefits and alternatives to treatment. A timeout was performed prior to the initiation of the procedure.  Initial ultrasound scanning demonstrates a large amount of ascites within the right lower abdominal quadrant. The right lower abdomen was prepped and draped in the usual sterile fashion. 1% lidocaine with epinephrine was used for local anesthesia. Under direct ultrasound guidance, a 19 gauge, 7-cm, Yueh catheter was introduced. An ultrasound image was saved for documentation purposed. the paracentesis was performed. The catheter was removed and a dressing was applied. The patient tolerated the procedure well without immediate post procedural complication.  FINDINGS: A total of approximately 5 liters of straw colored fluid was removed. Samples were sent to the laboratory as requested by the clinical team.  IMPRESSION: Successful ultrasound-guided paracentesis yielding 5 liters of peritoneal fluid.  5 liter max per MD.  Read by:  Robet Leu [facility]   Electronically Signed   By: Jolaine Click M.D.   On: 04/05/2015 15:05   US Paracentesis  03/13/2015   INDICATION: ascites  EXAM: ULTRASOUND-GUIDED PARACENTESIS   COMPARISON:  Previous paracentesis  MEDICATIONS: 10 cc 1% lidocaine  COMPLICATIONS: None immediate  TECHNIQUE: Informed written consent was obtained from the patient after a discussion of the risks, benefits and alternatives to treatment. A timeout was performed prior to the initiation of the procedure.  Initial ultrasound scanning demonstrates a large amount of ascites within the right lower abdominal quadrant. The right lower abdomen was prepped and draped in the usual sterile fashion. 1% lidocaine with epinephrine was used for local anesthesia. Under direct ultrasound guidance, a 19 gauge, 7-cm, Yueh catheter was introduced. An ultrasound image was saved for documentation purposed. The paracentesis was performed. The catheter was removed and a dressing was applied. The patient tolerated the procedure well without immediate post procedural complication.  FINDINGS: A total of approximately 6 liters of yellow fluid was removed.  IMPRESSION: Successful ultrasound-guided paracentesis yielding 6 liters of peritoneal  fluid.  Read by:  Robet Leu [facility]   Electronically Signed   By: Irish Lack M.D.   On: 03/13/2015 12:34   Dg Chest Port 1 View  04/02/2015   CLINICAL DATA:  Shortness of breath, end-stage renal disease, hypertension, pulmonary hypertension, cirrhosis  EXAM: PORTABLE CHEST - 1 VIEW  COMPARISON:  Portable exam 2149 hours compared to 03/28/2015  FINDINGS: Enlargement of cardiac silhouette with pulmonary vascular congestion.  Asymmetric infiltrates RIGHT greater than LEFT question asymmetric edema versus infection.  Small RIGHT pleural effusion.  No pneumothorax.  IMPRESSION: Enlargement of cardiac silhouette with pulmonary vascular congestion.  Asymmetric pulmonary infiltrates RIGHT greater than LEFT question pulmonary edema versus infection.   Electronically Signed   By: Ulyses Southward M.D.   On: 04/02/2015 22:12    ASSESSMENT AND PLAN  1. Pericardial effusion 2. Right heart failure 3. TR 4.  ESRD on HD Rec: inadequate HD due to hypotension most likely the cause of pericardial effusion with tamponade, now s/p drain. She is now nearly euvolemic as demonstrated by lack of peripheral edema and improvement in dyspnea. Her long term prognosis is poor.  Gregg Taylor,M.D.  04/06/2015 9:07 AM

## 2015-04-07 ENCOUNTER — Telehealth (HOSPITAL_COMMUNITY): Payer: Self-pay

## 2015-04-07 DIAGNOSIS — K7031 Alcoholic cirrhosis of liver with ascites: Secondary | ICD-10-CM

## 2015-04-07 LAB — CBC
HEMATOCRIT: 26 % — AB (ref 36.0–46.0)
Hemoglobin: 8.2 g/dL — ABNORMAL LOW (ref 12.0–15.0)
MCH: 28 pg (ref 26.0–34.0)
MCHC: 31.5 g/dL (ref 30.0–36.0)
MCV: 88.7 fL (ref 78.0–100.0)
Platelets: 298 10*3/uL (ref 150–400)
RBC: 2.93 MIL/uL — ABNORMAL LOW (ref 3.87–5.11)
RDW: 16.6 % — ABNORMAL HIGH (ref 11.5–15.5)
WBC: 12.5 10*3/uL — AB (ref 4.0–10.5)

## 2015-04-07 LAB — RENAL FUNCTION PANEL
Albumin: 1.3 g/dL — ABNORMAL LOW (ref 3.5–5.0)
Anion gap: 16 — ABNORMAL HIGH (ref 5–15)
BUN: 48 mg/dL — AB (ref 6–20)
CALCIUM: 6.7 mg/dL — AB (ref 8.9–10.3)
CO2: 22 mmol/L (ref 22–32)
Chloride: 100 mmol/L — ABNORMAL LOW (ref 101–111)
Creatinine, Ser: 8.44 mg/dL — ABNORMAL HIGH (ref 0.44–1.00)
GFR calc Af Amer: 6 mL/min — ABNORMAL LOW (ref >60)
GFR calc non Af Amer: 5 mL/min — ABNORMAL LOW (ref >60)
Glucose, Bld: 98 mg/dL (ref 65–99)
POTASSIUM: 5.1 mmol/L (ref 3.5–5.1)
Phosphorus: 8.3 mg/dL — ABNORMAL HIGH (ref 2.5–4.6)
Sodium: 138 mmol/L (ref 135–145)

## 2015-04-07 MED ORDER — PROMETHAZINE HCL 12.5 MG PO TABS: 12.5000 mg | ORAL_TABLET | Freq: Four times a day (QID) | ORAL | Status: AC | PRN

## 2015-04-07 MED ORDER — ZOLPIDEM TARTRATE 5 MG PO TABS: 5.0000 mg | ORAL_TABLET | Freq: Every evening | ORAL | Status: AC | PRN

## 2015-04-07 MED ORDER — MIDODRINE HCL 10 MG PO TABS: 10.0000 mg | ORAL_TABLET | Freq: Three times a day (TID) | ORAL | Status: AC

## 2015-04-07 MED ORDER — PANTOPRAZOLE SODIUM 40 MG PO TBEC: 40.0000 mg | DELAYED_RELEASE_TABLET | Freq: Two times a day (BID) | ORAL | Status: AC

## 2015-04-07 MED ORDER — CALCIUM ACETATE 667 MG PO CAPS: 667.0000 mg | ORAL_CAPSULE | Freq: Three times a day (TID) | ORAL | Status: AC

## 2015-04-07 MED ORDER — TRAMADOL HCL 50 MG PO TABS: 50.0000 mg | ORAL_TABLET | Freq: Two times a day (BID) | ORAL | Status: AC | PRN

## 2015-04-07 MED ORDER — RENA-VITE PO TABS: 1.0000 | ORAL_TABLET | Freq: Every day | ORAL | Status: AC

## 2015-04-07 MED ORDER — ATORVASTATIN CALCIUM 20 MG PO TABS: 20.0000 mg | ORAL_TABLET | Freq: Every day | ORAL | Status: AC

## 2015-04-07 NOTE — Telephone Encounter (Signed)
MD calling from [place].  Pt currently in his ED would like medical records.  Md connected with medical records for further assistance.

## 2015-04-07 NOTE — Progress Notes (Signed)
SATURATION QUALIFICATIONS: (This note is used to comply with regulatory documentation for home oxygen)  Patient Saturations on Room Air at Rest = 100%  Patient Saturations on Room Air while Ambulating = %   Patient Saturations on  Liters of oxygen while Ambulating = %  Please briefly explain why patient needs home oxygen:  Pt unable to qualify for home O2, as she is 100% on RA.  Additionally, unable to assess while ambulating, as she is unable to ambulate.

## 2015-04-07 NOTE — Discharge Summary (Signed)
Physician Discharge Summary   Patient ID: Holly Mendoza MRN: [id number] DOB/AGE: 1971-07-29 44 y.o.  Admit date: 03/28/2015 Discharge date: 04/07/2015  Primary Care Physician:  Dorrene German, MD  Discharge Diagnoses:   . End stage renal disease on dialysis . Cirrhosis . Chest pain with high risk for cardiac etiology . Hyperkalemia . Ascites . Adjustment disorder with mixed anxiety and depressed mood . Acute on chronic diastolic CHF (congestive heart failure) . Other specified hypotension . HTN (hypertension) . Congestive heart disease . NSTEMI (non-ST elevated myocardial infarction)  Consults:  Cardiology Nephrology, Dr. Arta Silence Vascular surgery Palliative medicine   Recommendations for Outpatient Follow-up:  Patient has decided to pursue hemodialysis and further care in [place]. She received extra hemodialysis yesterday for the travel today. She had a paracentesis done on 6/17.   DIET: Renal diet    Allergies:   Allergies  Allergen Reactions  . Contrast Media [Iodinated Diagnostic Agents] Anaphylaxis  . Procardia [Nifedipine] Other (See Comments)    Sweating, BP drops dangerously low,  . Vancomycin Anaphylaxis and Hives     Discharge Medications:   Medication List    STOP taking these medications        amLODipine 5 MG tablet  Commonly known as:  NORVASC     cloNIDine 0.3 MG tablet  Commonly known as:  CATAPRES     esomeprazole 40 MG capsule  Commonly known as:  NEXIUM     levofloxacin 500 MG tablet  Commonly known as:  LEVAQUIN     oxyCODONE 5 MG immediate release tablet  Commonly known as:  Oxy IR/ROXICODONE      TAKE these medications        acetaminophen 500 MG tablet  Commonly known as:  TYLENOL  Take 500 mg by mouth every 6 (six) hours as needed for moderate pain.     aspirin EC 81 MG tablet  Take 1 tablet (81 mg total) by mouth daily.     atorvastatin 20 MG tablet  Commonly known as:  LIPITOR  Take 1 tablet (20 mg total) by  mouth daily.     calcium acetate 667 MG capsule  Commonly known as:  PHOSLO  Take 1 capsule (667 mg total) by mouth 3 (three) times daily with meals.     eucerin cream  Apply 1 application topically daily as needed for dry skin.     midodrine 10 MG tablet  Commonly known as:  PROAMATINE  Take 1 tablet (10 mg total) by mouth 3 (three) times daily with meals.     multivitamin Tabs tablet  Take 1 tablet by mouth at bedtime.     pantoprazole 40 MG tablet  Commonly known as:  PROTONIX  Take 1 tablet (40 mg total) by mouth 2 (two) times daily.     traMADol 50 MG tablet  Commonly known as:  ULTRAM  Take 1 tablet (50 mg total) by mouth every 12 (twelve) hours as needed for severe pain.     zolpidem 5 MG tablet  Commonly known as:  AMBIEN  Take 1 tablet (5 mg total) by mouth at bedtime as needed for sleep.         Brief H and P: For complete details please refer to admission H and P, but in brief 44 y.o. 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 Patient presented to the emergency department c/o cough and shortness  of breath. Patient reported progressively worsening shortness of breath over a week. She was given levaquin for possible pna but reported she did not take it. She reported constant chest pain for a week, and worsening of chronic ascites. She reportedly missed her last dialysis. In the ED course she was found to be markedly fluid overloaded, elevation of troponin, mild elevation of potassium, and EKG with ? Mobitz I with QT prolongation of 520. Nephrology was consulted, and patient initially agreed with emergent dialysis. She did not wish to have paracentesis done, She did not wish to have aggressive cardiac intervention however desired to remain full code.  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 She was found to be in pulmonary edema, markedly fluid overloaded, elevated troponins, QT prolongation of 520. Patient was admitted to stepdown unit and underwent emergent dialysis.  Severe volume overload due to noncompliance w/ HD Patient continued to receive emergent dialysis at the time of admission and subsequently with her schedule, MWF. However given the overall status, prognosis poor, nephrology to not feel that he will be able to rectify the situation with dialysis only. Palliative medicine consult was obtained. She however wishes to continue with the hemodialysis. She was placed on midodrine 10 mg TID for hypotension. Per patient's family, they requested the patient be discharged so that she can go to [place] and pursue the medical care with her family over there. Patient received extra hemodialysis on 5/18.  GERD: Placed on PPI  Cirrhosis / chronic ascites  -Status post paracentesis, 5 L removed, no SBP per studies  Large pericardial effusion 2-D echo on 6/10 showed EF of 60% however large circumferential pericardial effusion. Cardiology was consulted and patient underwent pericardiocentesis, 650 mL of serosanguineous fluid was drained. Pericardial drain was removed on 6/16. Repeat 2-D echo on 6/16 showed no pericardial effusion.   HTN/Hyotension - continue Midodrine 10 mg TID  ESRD w/ noncompliance w/ HD - Psych has seen patient and states she has capacity/is competent to make her own decisions  - Patient had revision of left thigh graft on 6/14; per nephrology able start using graft right away.   Hyperkalemia due to noncompliance w/ HD  Acute on chronic diastolic congestive heart failure -Secondary to patient's poor BP unable to use diuretics, fluid management per hemodialysis.  Elevated  troponin - NSTEMI -Refused planned cardiac cath 6/10 - this is the second time she has been advised to have cath and refused on the day of the procedure (also March 2016). She was followed by cardiology during this admission.   Home O2 evaluation was done at the time of discharge and patient did not qualify for home O2.  Palliative medicine consult was obtained during this hospitalization and patient, family wants to continue with the hemodialysis treatments and aggressive care however they want to pursue further care in [place]. Patient had hemodialysis done on 6/18, Saturday for the travel, had paracentesis done on 6/17. She needs hemodialysis MWF. Patient's family reported to me that they have contacted [facility] and spoken with Dr. Noel Gerold there. Patient does not feel she is ready for hospice. Per nephrology, patient is cleared for the discharge.   Day of Discharge BP 90/54 mmHg  Pulse 110  Temp(Src) 98.1 F (36.7 C) (Oral)  Resp 18  Ht 5' 9.5" (1.765 m)  Wt 77.4  kg (170 lb 10.2 oz)  BMI 24.85 kg/m2  SpO2 100%  Physical Exam: General: Alert and awake oriented x3 not in any acute distress. HEENT: anicteric sclera, pupils reactive to light and accommodation CVS: S1-S2 clear, TR murmur Chest: clear to auscultation bilaterally, no wheezing rales or rhonchi Abdomen: soft nontender, distended, normal bowel sounds Extremities: no cyanosis, clubbing or edema noted bilaterally Neuro: Cranial nerves II-XII intact, no focal neurological deficits   The results of significant diagnostics from this hospitalization (including imaging, microbiology, ancillary and laboratory) are listed below for reference.    LAB RESULTS: Basic Metabolic Panel:  Recent Labs Lab 04/02/15 1118  04/06/15 1550 04/07/15 0410  NA 132*  < > 137 138  K 6.2*  < > 5.1 5.1  CL 91*  < > 101 100*  CO2 22  < > 22 22  GLUCOSE 84  < > 92 98  BUN 104*  < > 65* 48*  CREATININE 13.33*  < > 10.03*  8.44*  CALCIUM 6.8*  < > 6.6* 6.7*  MG 1.8  --   --   --   PHOS  --   < >  --  8.3*  < > = values in this interval not displayed. Liver Function Tests:  Recent Labs Lab 04/02/15 1118 04/03/15 0915 04/07/15 0410  AST 34  --   --   ALT 28  --   --   ALKPHOS 128*  --   --   BILITOT 0.5  --   --   PROT 4.2*  --   --   ALBUMIN 1.3* 1.5* 1.3*   No results for input(s): LIPASE, AMYLASE in the last 168 hours. No results for input(s): AMMONIA in the last 168 hours. CBC:  Recent Labs Lab 04/02/15 1345  04/06/15 1550 04/07/15 0410  WBC 13.9*  < > 12.4* 12.5*  NEUTROABS 11.6*  --   --   --   HGB 8.5*  < > 8.4* 8.2*  HCT 26.0*  < > 26.2* 26.0*  MCV 85.8  < > 87.3 88.7  PLT 301  < > 356 298  < > = values in this interval not displayed. Cardiac Enzymes: No results for input(s): CKTOTAL, CKMB, CKMBINDEX, TROPONINI in the last 168 hours. BNP: Invalid input(s): POCBNP CBG: No results for input(s): GLUCAP in the last 168 hours.  Significant Diagnostic Studies:  Dg Chest 2 View  03/28/2015   CLINICAL DATA:  Increased shortness of breath. End-stage renal disease with dialysis. The patient did not go to dialysis yesterday.  EXAM: CHEST - 2 VIEW  COMPARISON:  Two-view chest 03/22/2015  FINDINGS: The heart is enlarged. Interstitial edema has increased since the prior exam. Small bilateral pleural effusions are noted. Bibasilar airspace disease likely reflects atelectasis.  IMPRESSION: 1. Cardiomegaly and progressive edema and bilateral effusions compatible with congestive heart failure. 2. Bibasilar spur is disease likely reflects atelectasis.   Electronically Signed   By: Marin Roberts M.D.   On: 03/28/2015 15:12    2D ECHO:  Study Conclusions  - Left ventricle: Septal flattening from cor pulmonale. The cavity size was normal. Wall thickness was increased in a pattern of moderate LVH. Systolic function was normal. The estimated ejection fraction was in the range of 55% to  60%. - Aortic valve: Heavily calcified with mild stenosis CW doppler poor quality. Valve area (VTI): 1.19 cm^2. Valve area (Vmax): 1.12 cm^2. Valve area (Vmean): 1.17 cm^2. - Mitral valve: Severe subchordal calcification and MAC Extends to intervalvular  fibrosa and aortic valve Cannot r/o SBE wtih fairly large nodular calcified lesion on ventricular surface of anterior mitral leaflet in LVOT. There was mild regurgitation. - Left atrium: The atrium was mildly dilated. - Right ventricle: The cavity size was severely dilated. - Right atrium: The atrium was severely dilated. - Atrial septum: No defect or patent foramen ovale was identified. - Tricuspid valve: There was severe regurgitation. - Pericardium, extracardiac: Prominent epicardial fat no pericardial effusion appreciated. Severe cor pulmonale , RV failure and wide open TR. Disposition and Follow-up:     Discharge Instructions    Diet - low sodium heart healthy    Complete by:  As directed      Discharge instructions    Complete by:  As directed   Fluid restriction 1200cc in 24hours     Increase activity slowly    Complete by:  As directed             DISPOSITION: home/ with family per patient and family's request   DISCHARGE FOLLOW-UP Follow-up Information    Follow up with AVBUERE,EDWIN A, MD. Schedule an appointment as soon as possible for a visit in 2 weeks.   Specialty:  Internal Medicine   Why:  for hospital follow-up, As needed   Contact information:   [address] [place] [address] [phone]        Time spent on Discharge: 35 mins   Signed:   RAI,RIPUDEEP M.D. [facility] 04/07/2015, 8:10 AM Pager: [phone]

## 2015-04-07 NOTE — Progress Notes (Signed)
Pt unable to ambulate and therefore, cannot get O2 saturations when ambulating.

## 2015-04-07 NOTE — Progress Notes (Signed)
Orders received for pt discharge.  Discharge summary printed and reviewed with pt.  Explained medication regimen, and pt had no further questions at this time.  IV removed and site remains clean, dry, intact.  Telemetry removed.  Pt in stable condition and awaiting transport. 

## 2015-04-08 LAB — AMYLASE, PERITONEAL FLUID: AMYLASE, PERITONEAL FLUID: 17 U/L

## 2015-04-08 LAB — PATHOLOGIST SMEAR REVIEW

## 2015-04-10 LAB — CULTURE, BODY FLUID-BOTTLE

## 2015-04-10 LAB — CULTURE, BODY FLUID W GRAM STAIN -BOTTLE: Culture: NO GROWTH

## 2015-08-20 DEATH — deceased

## 2016-06-09 IMAGING — US US PARACENTESIS
1 series · 9 of 9 positions shown · non-contrast
Comparison: Prior paracentesis on 01/30/2015

MEDICATIONS:
None.

COMPLICATIONS:
None immediate

INDICATION: End-stage renal disease, pulmonary artery hypertension, portal
hypertension, recurrent ascites. Request is made for therapeutic
paracentesis.

EXAM:
ULTRASOUND-GUIDED THERAPEUTIC PARACENTESIS
TECHNIQUE: Informed written consent was obtained from the patient after a
discussion of the risks, benefits and alternatives to treatment. A
timeout was performed prior to the initiation of the procedure.

[Series 1: us paracentesis · 0.27mm/px · 9 of 9 slices shown]
[im 1/9]
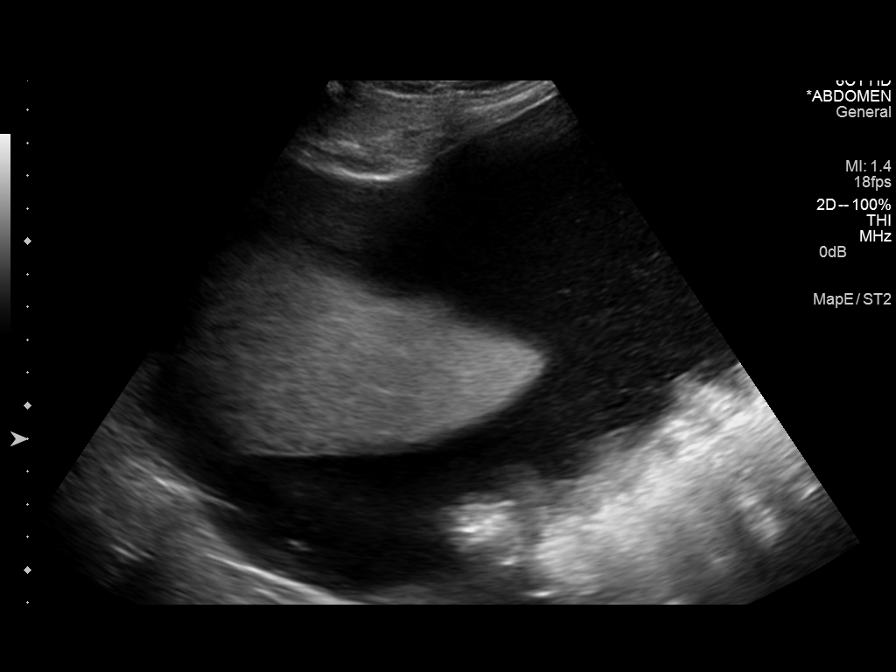
[im 2/9]
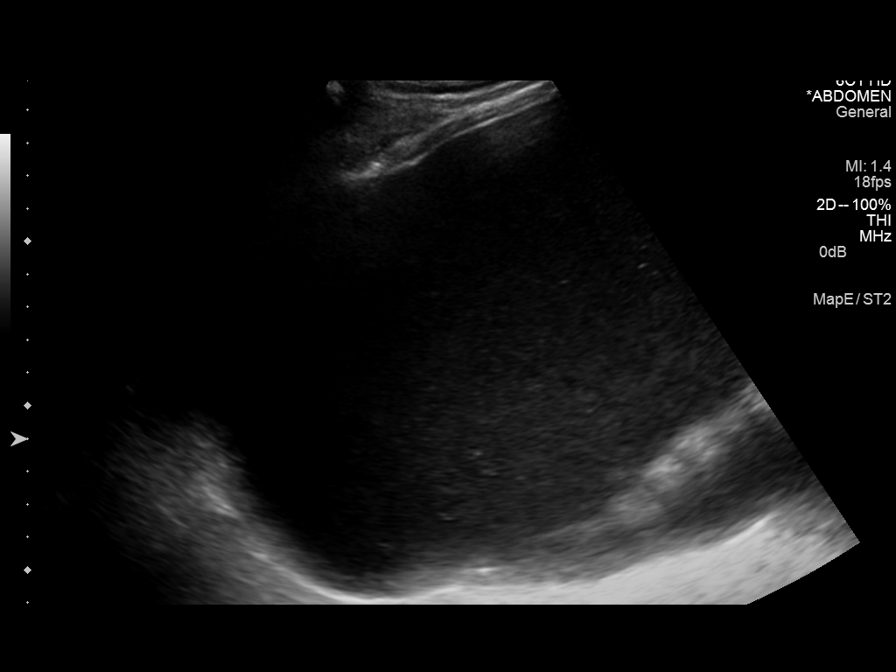
[im 3/9]
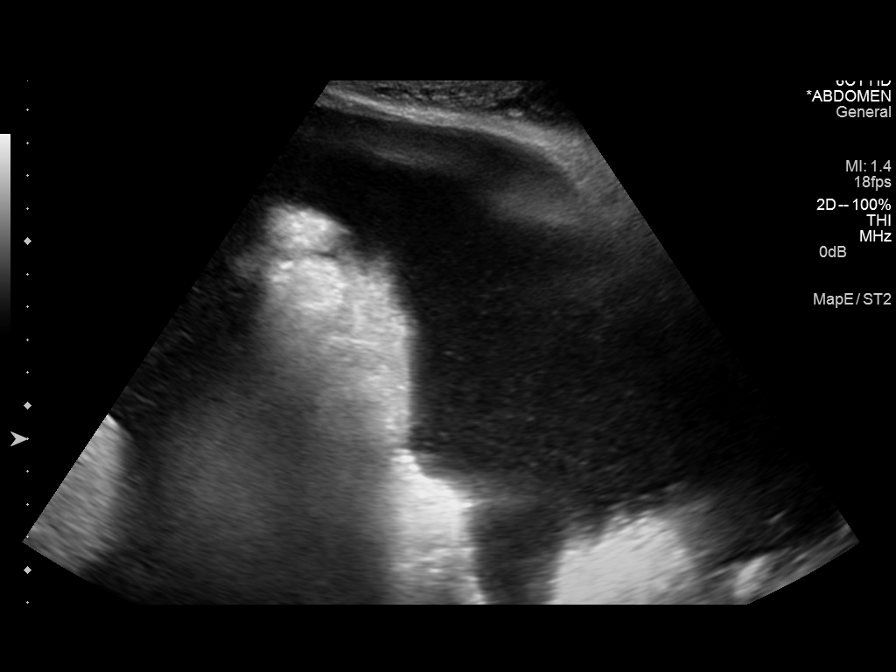
[im 4/9]
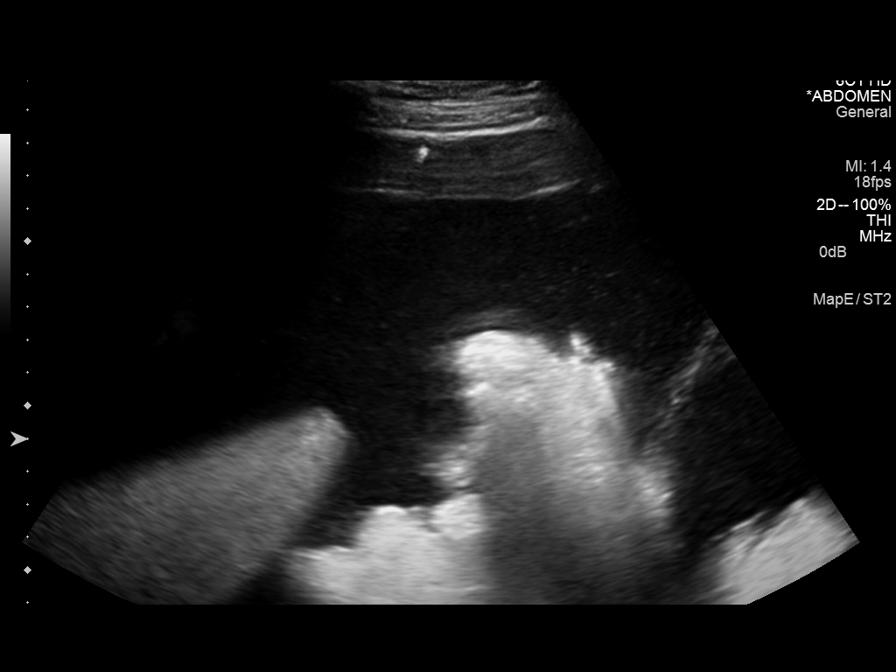
[im 5/9]
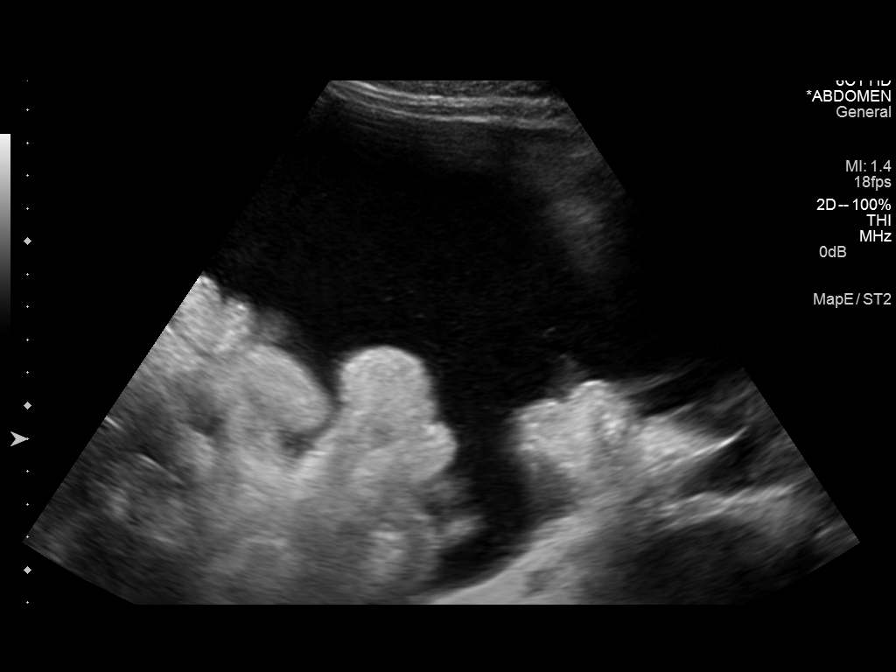
[im 6/9]
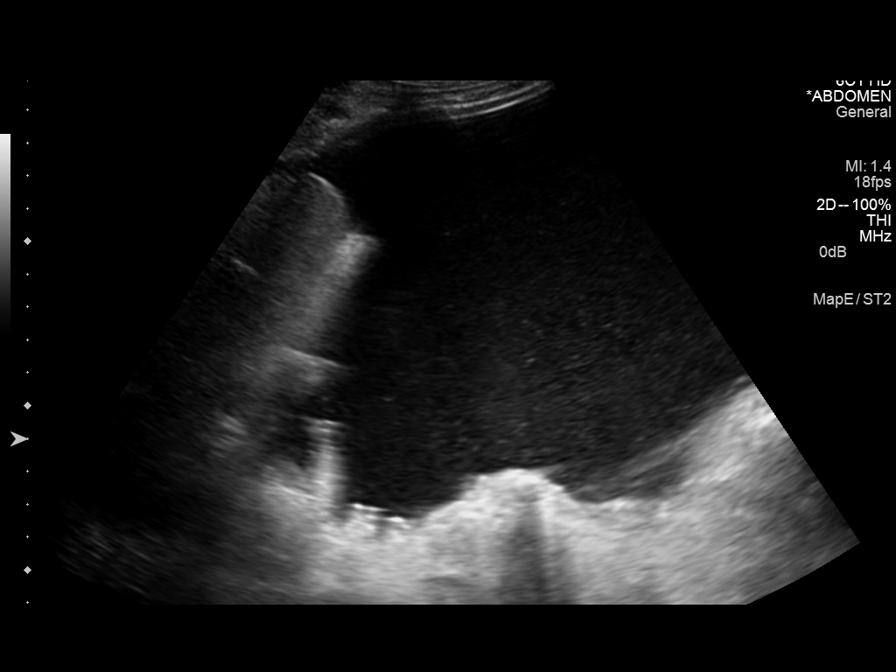
[im 7/9]
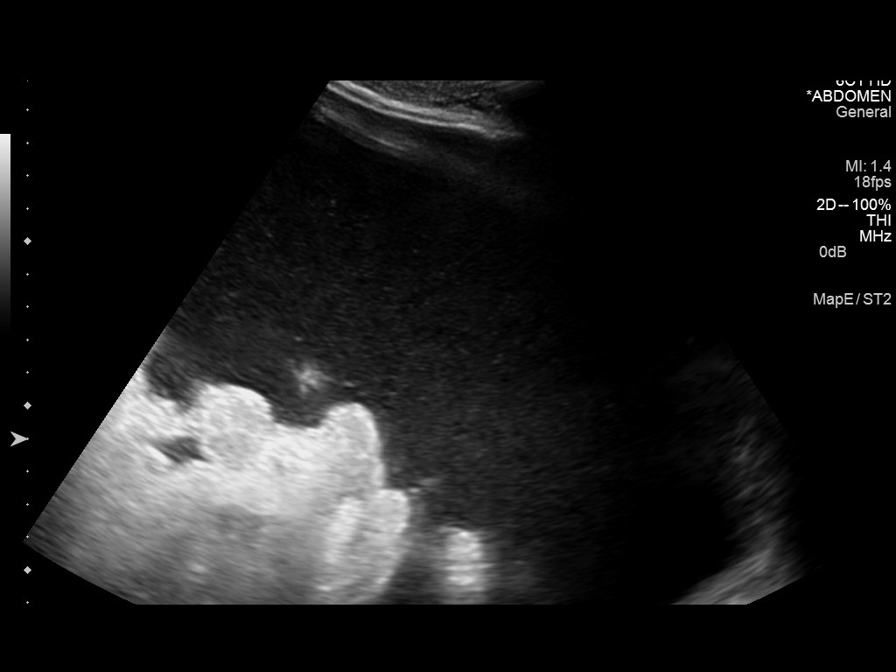
[im 8/9]
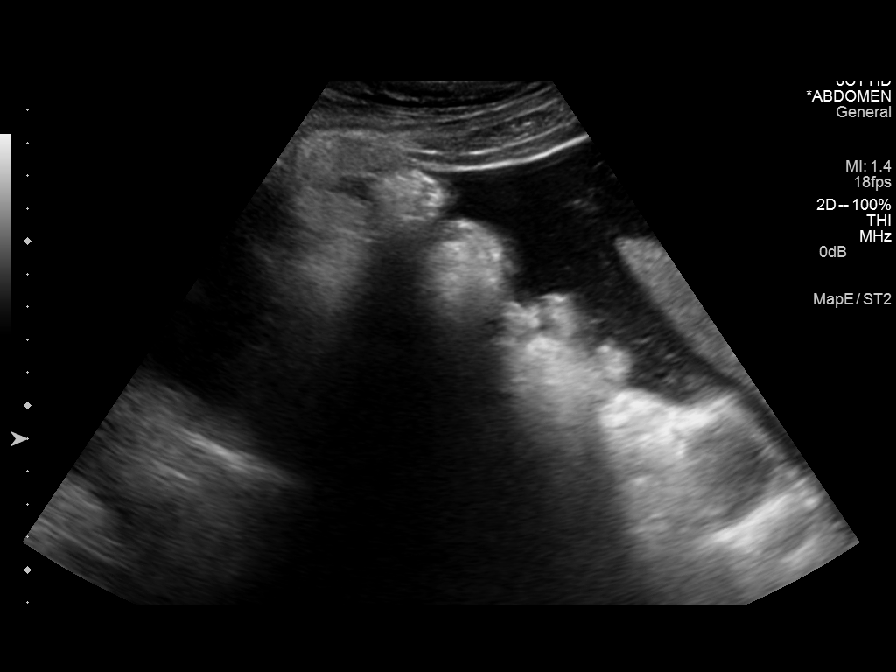
[im 9/9]
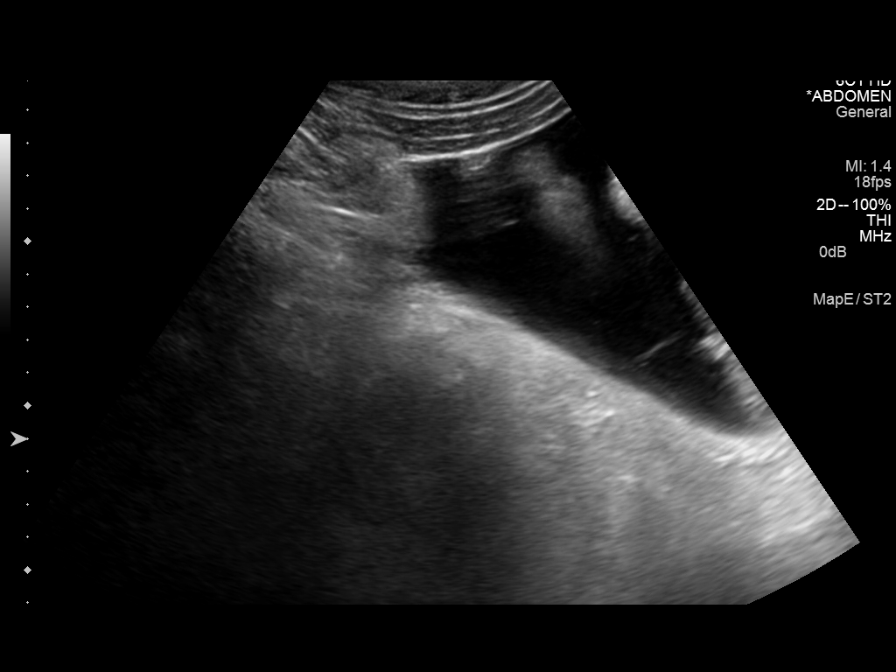

[9 of 9 positions shown; findings below may reference images not displayed]

Initial ultrasound scanning demonstrates a large amount of ascites
within the right lower abdominal quadrant. The right lower abdomen
was prepped and draped in the usual sterile fashion. 1% lidocaine
was used for local anesthesia. Under direct ultrasound guidance, a
19 gauge, 10-cm, Yueh catheter was introduced. An ultrasound image
was saved for documentation purposed. The paracentesis was
performed. The catheter was removed and a dressing was applied. The
patient tolerated the procedure well without immediate post
procedural complication.
FINDINGS: A total of approximately 6.9 liters of amber fluid was removed.
IMPRESSION: Successful ultrasound-guided therapeutic paracentesis yielding
liters of peritoneal fluid.

## 2016-07-20 IMAGING — DX DG CHEST 2V
2 series · 2 of 2 positions shown · non-contrast
Comparison: Two-view chest 03/22/2015

CLINICAL DATA: Increased shortness of breath. End-stage renal
disease with dialysis. The patient did not go to dialysis yesterday.

EXAM:
CHEST - 2 VIEW

[chest lat]
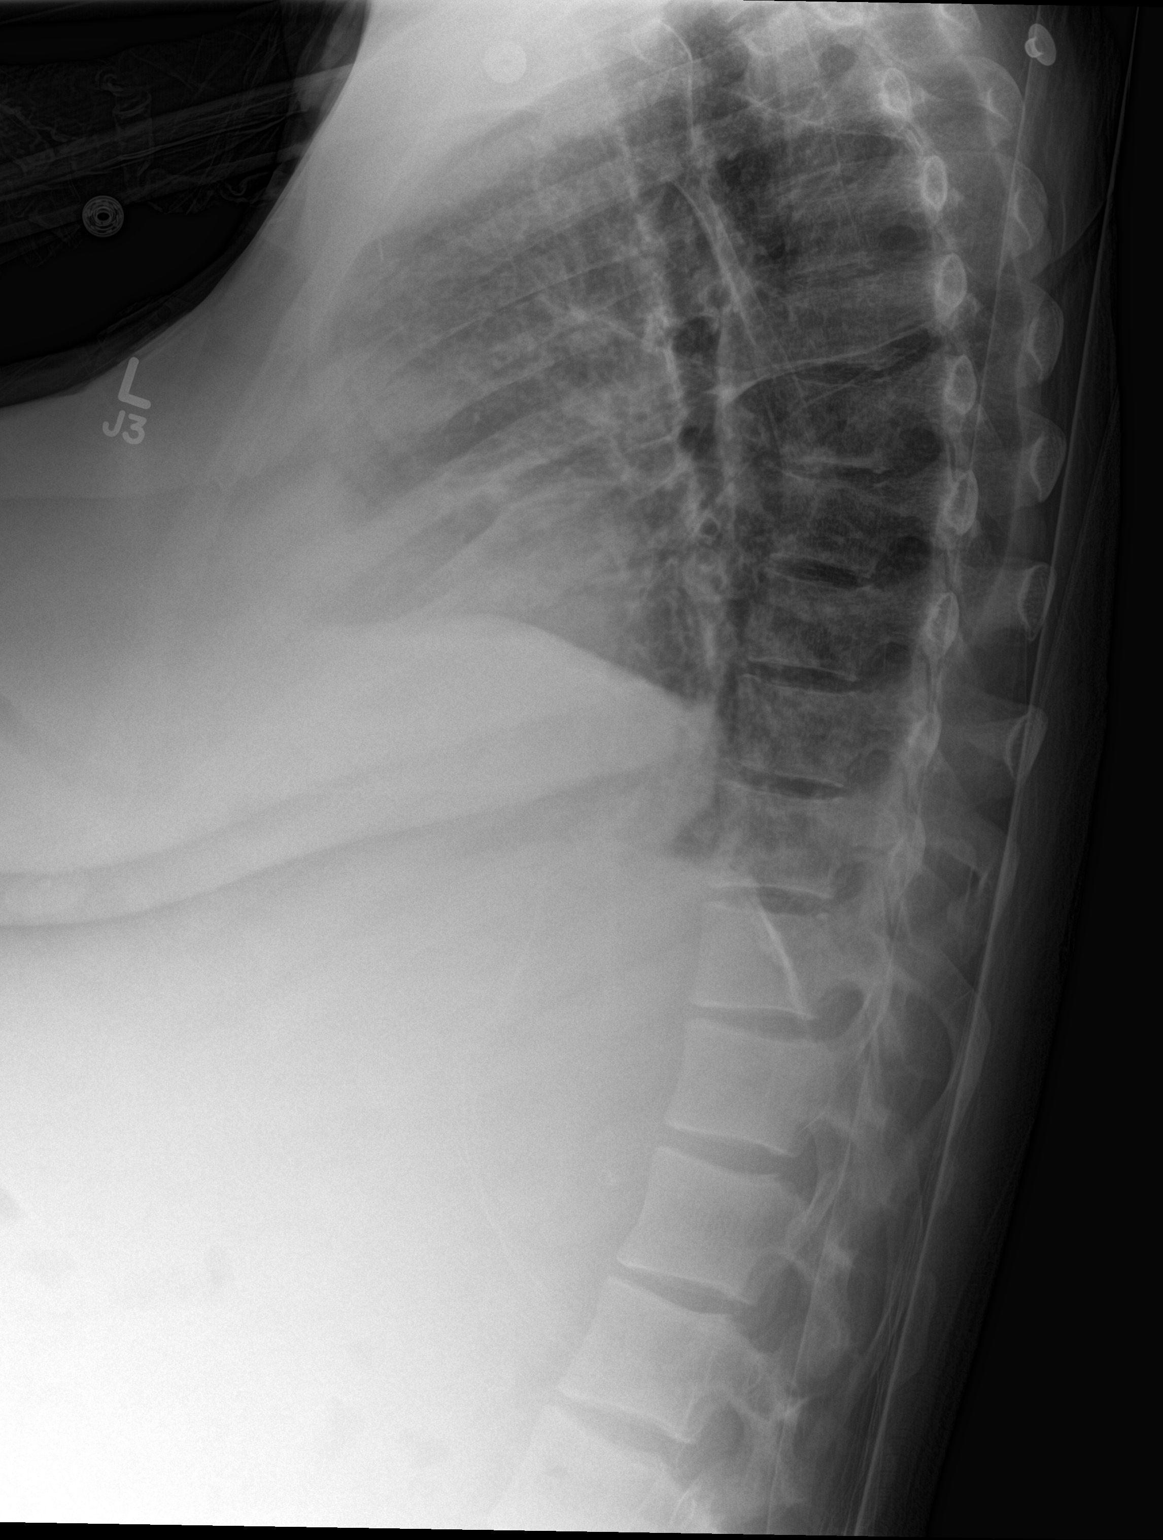

[chest ap]
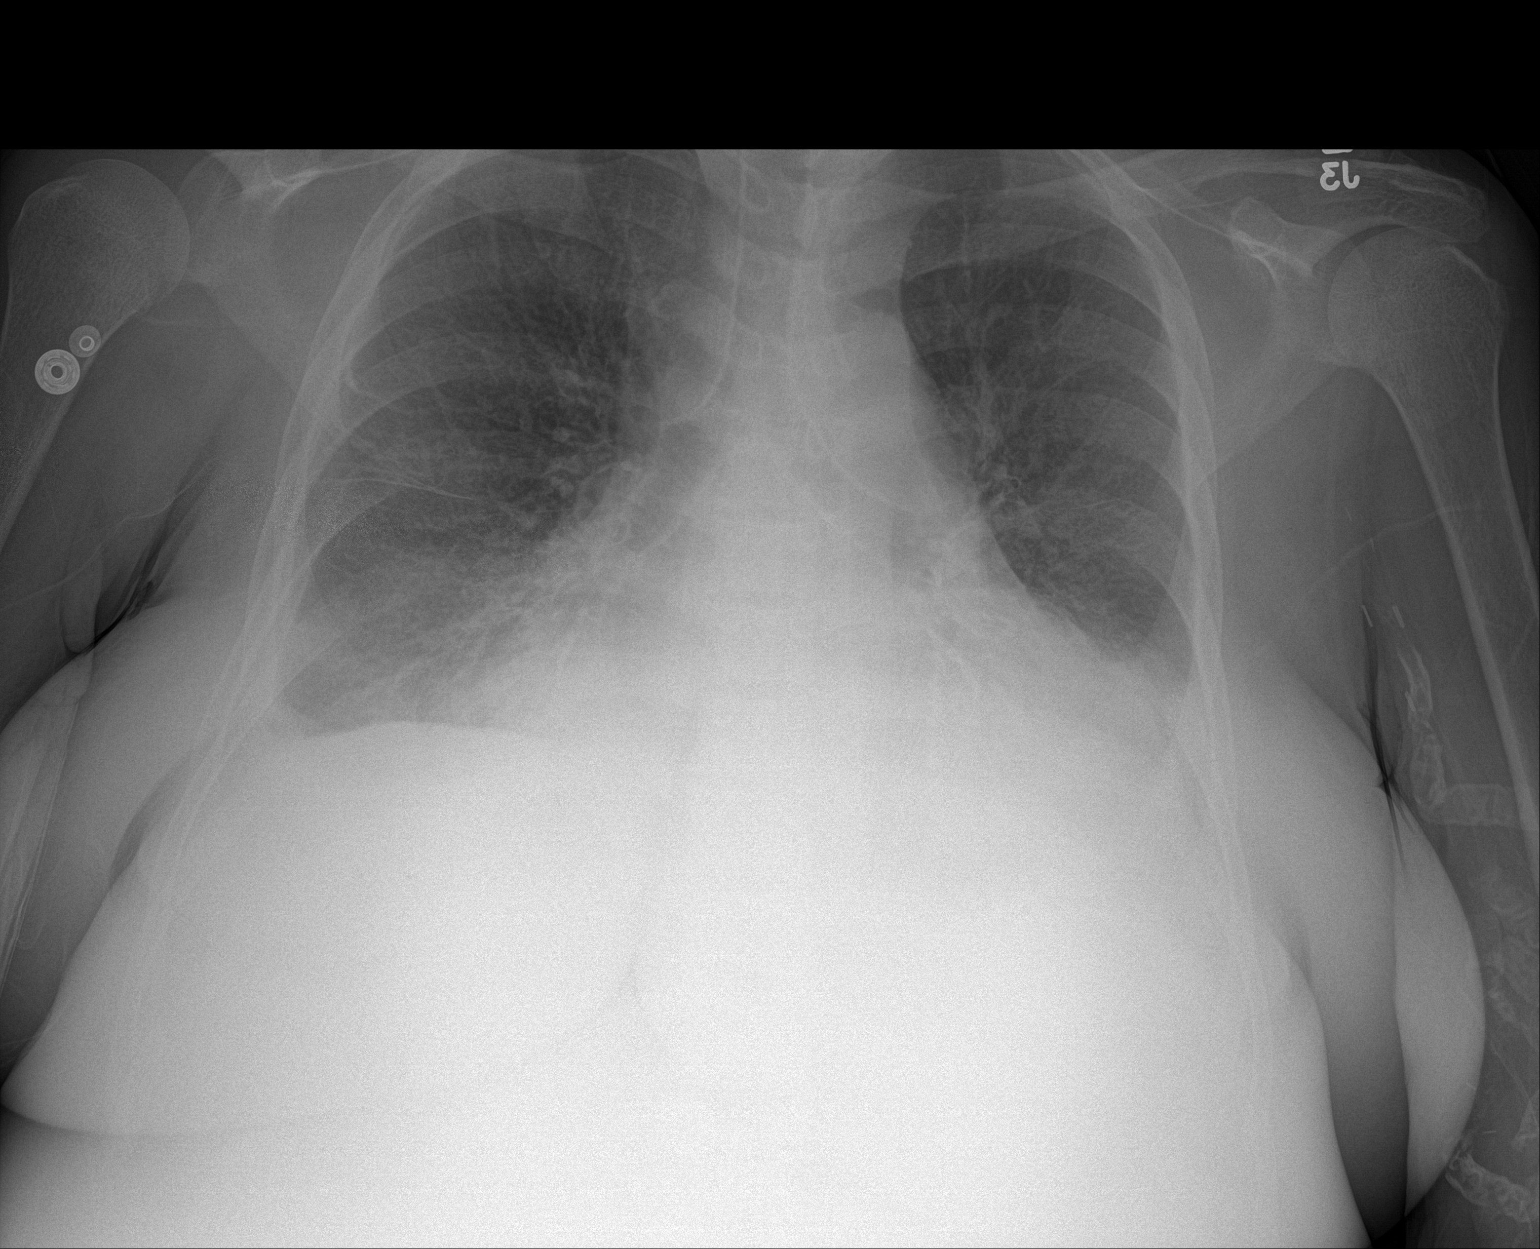

[2 of 2 positions shown; findings below may reference images not displayed]

FINDINGS: The heart is enlarged. Interstitial edema has increased since the
prior exam. Small bilateral pleural effusions are noted. Bibasilar
airspace disease likely reflects atelectasis.
IMPRESSION: 1. Cardiomegaly and progressive edema and bilateral effusions
compatible with congestive heart failure.
2. Bibasilar spur is disease likely reflects atelectasis.
